# Patient Record
Sex: Female | Born: 1980 | Race: Black or African American | Hispanic: No | Marital: Married | Smoking: Never smoker
Health system: Southern US, Community
[De-identification: ages and names within clinical notes are randomized; demographics above are authoritative.]

## PROBLEM LIST (undated history)

## (undated) ENCOUNTER — Emergency Department: Payer: Self-pay

## (undated) DIAGNOSIS — D508 Other iron deficiency anemias: Secondary | ICD-10-CM

## (undated) DIAGNOSIS — K589 Irritable bowel syndrome without diarrhea: Secondary | ICD-10-CM

## (undated) DIAGNOSIS — I10 Essential (primary) hypertension: Secondary | ICD-10-CM

## (undated) DIAGNOSIS — K5090 Crohn's disease, unspecified, without complications: Secondary | ICD-10-CM

## (undated) DIAGNOSIS — E7800 Pure hypercholesterolemia, unspecified: Secondary | ICD-10-CM

## (undated) DIAGNOSIS — E119 Type 2 diabetes mellitus without complications: Secondary | ICD-10-CM

## (undated) HISTORY — DX: Crohn's disease, unspecified, without complications: K50.90

## (undated) HISTORY — DX: Other iron deficiency anemias: D50.8

## (undated) HISTORY — DX: Type 2 diabetes mellitus without complications: E11.9

---

## 2005-06-16 ENCOUNTER — Ambulatory Visit: Payer: Self-pay | Admitting: Family Medicine

## 2005-06-16 ENCOUNTER — Other Ambulatory Visit: Admission: RE | Admit: 2005-06-16 | Discharge: 2005-06-16 | Payer: Self-pay | Admitting: Family Medicine

## 2005-06-16 ENCOUNTER — Encounter: Payer: Self-pay | Admitting: Family Medicine

## 2005-11-14 ENCOUNTER — Ambulatory Visit: Payer: Self-pay | Admitting: Family Medicine

## 2005-12-20 ENCOUNTER — Ambulatory Visit: Payer: Self-pay | Admitting: Family Medicine

## 2006-01-25 ENCOUNTER — Ambulatory Visit: Payer: Self-pay | Admitting: Family Medicine

## 2006-04-26 ENCOUNTER — Ambulatory Visit: Payer: Self-pay | Admitting: Family Medicine

## 2006-04-26 DIAGNOSIS — L0292 Furuncle, unspecified: Secondary | ICD-10-CM | POA: Insufficient documentation

## 2006-04-26 DIAGNOSIS — L0293 Carbuncle, unspecified: Secondary | ICD-10-CM

## 2006-04-26 DIAGNOSIS — R350 Frequency of micturition: Secondary | ICD-10-CM

## 2006-04-26 DIAGNOSIS — I10 Essential (primary) hypertension: Secondary | ICD-10-CM

## 2006-04-26 LAB — CONVERTED CEMR LAB
Beta hcg, urine, semiquantitative: NEGATIVE
Ketones, urine, test strip: NEGATIVE
Protein, U semiquant: NEGATIVE
Specific Gravity, Urine: 1.02
Urobilinogen, UA: 0.2
pH: 8

## 2006-05-15 ENCOUNTER — Ambulatory Visit: Payer: Self-pay | Admitting: Family Medicine

## 2006-05-16 ENCOUNTER — Encounter: Payer: Self-pay | Admitting: Family Medicine

## 2006-05-17 ENCOUNTER — Telehealth: Payer: Self-pay | Admitting: Family Medicine

## 2006-05-18 ENCOUNTER — Encounter: Payer: Self-pay | Admitting: Family Medicine

## 2008-11-13 ENCOUNTER — Ambulatory Visit: Payer: Self-pay | Admitting: Family Medicine

## 2008-11-13 DIAGNOSIS — R21 Rash and other nonspecific skin eruption: Secondary | ICD-10-CM

## 2008-11-13 DIAGNOSIS — R635 Abnormal weight gain: Secondary | ICD-10-CM | POA: Insufficient documentation

## 2009-02-19 ENCOUNTER — Ambulatory Visit: Payer: Self-pay | Admitting: Family Medicine

## 2009-02-19 ENCOUNTER — Inpatient Hospital Stay (HOSPITAL_COMMUNITY): Admission: AD | Admit: 2009-02-19 | Discharge: 2009-02-19 | Payer: Self-pay | Admitting: Family Medicine

## 2009-02-19 DIAGNOSIS — N751 Abscess of Bartholin's gland: Secondary | ICD-10-CM

## 2009-02-19 LAB — CONVERTED CEMR LAB
Bilirubin Urine: NEGATIVE
Blood in Urine, dipstick: NEGATIVE
Glucose, Urine, Semiquant: NEGATIVE
pH: 8.5

## 2009-05-12 ENCOUNTER — Encounter: Payer: Self-pay | Admitting: Family Medicine

## 2009-05-12 ENCOUNTER — Telehealth: Payer: Self-pay | Admitting: Family Medicine

## 2009-08-23 ENCOUNTER — Inpatient Hospital Stay (HOSPITAL_COMMUNITY): Admission: AD | Admit: 2009-08-23 | Discharge: 2009-08-23 | Payer: Self-pay | Admitting: Family Medicine

## 2009-08-24 ENCOUNTER — Telehealth: Payer: Self-pay | Admitting: Family Medicine

## 2009-10-05 ENCOUNTER — Telehealth (INDEPENDENT_AMBULATORY_CARE_PROVIDER_SITE_OTHER): Payer: Self-pay | Admitting: *Deleted

## 2010-01-04 ENCOUNTER — Encounter: Payer: Self-pay | Admitting: Family Medicine

## 2010-01-12 ENCOUNTER — Telehealth (INDEPENDENT_AMBULATORY_CARE_PROVIDER_SITE_OTHER): Payer: Self-pay | Admitting: *Deleted

## 2010-01-17 ENCOUNTER — Telehealth: Payer: Self-pay | Admitting: Family Medicine

## 2010-01-21 ENCOUNTER — Ambulatory Visit: Payer: Self-pay | Admitting: Family Medicine

## 2010-01-21 DIAGNOSIS — D509 Iron deficiency anemia, unspecified: Secondary | ICD-10-CM

## 2010-01-25 ENCOUNTER — Encounter: Payer: Self-pay | Admitting: Family Medicine

## 2010-02-07 ENCOUNTER — Encounter: Payer: Self-pay | Admitting: Family Medicine

## 2010-02-08 ENCOUNTER — Encounter: Payer: Self-pay | Admitting: Family Medicine

## 2010-02-09 LAB — CONVERTED CEMR LAB
ALT: 21 units/L (ref 0–35)
BUN: 10 mg/dL (ref 6–23)
CO2: 28 meq/L (ref 19–32)
Calcium: 9.4 mg/dL (ref 8.4–10.5)
Chloride: 101 meq/L (ref 96–112)
Cholesterol: 248 mg/dL — ABNORMAL HIGH (ref 0–200)
Creatinine, Ser: 0.75 mg/dL (ref 0.40–1.20)
Glucose, Bld: 105 mg/dL — ABNORMAL HIGH (ref 70–99)
HCT: 39.7 % (ref 36.0–46.0)
HDL: 49 mg/dL (ref >39)
Hemoglobin: 11.8 g/dL — ABNORMAL LOW (ref 12.0–15.0)
Iron: 23 ug/dL — ABNORMAL LOW (ref 42–145)
RBC: 5.29 M/uL — ABNORMAL HIGH (ref 3.87–5.11)
Total CHOL/HDL Ratio: 5.1
WBC: 6.1 10*3/uL (ref 4.0–10.5)

## 2010-02-21 ENCOUNTER — Encounter: Payer: Self-pay | Admitting: Family Medicine

## 2010-04-27 ENCOUNTER — Ambulatory Visit: Payer: Self-pay | Admitting: Family Medicine

## 2010-04-27 DIAGNOSIS — R1013 Epigastric pain: Secondary | ICD-10-CM | POA: Insufficient documentation

## 2010-04-27 DIAGNOSIS — R7301 Impaired fasting glucose: Secondary | ICD-10-CM | POA: Insufficient documentation

## 2010-04-27 DIAGNOSIS — E785 Hyperlipidemia, unspecified: Secondary | ICD-10-CM | POA: Insufficient documentation

## 2010-04-27 DIAGNOSIS — E1169 Type 2 diabetes mellitus with other specified complication: Secondary | ICD-10-CM | POA: Insufficient documentation

## 2010-04-28 LAB — CONVERTED CEMR LAB
ALT: 24 units/L (ref 0–35)
CO2: 27 meq/L (ref 19–32)
Calcium: 9.2 mg/dL (ref 8.4–10.5)
Chloride: 103 meq/L (ref 96–112)
Creatinine, Ser: 0.78 mg/dL (ref 0.40–1.20)
Lipase: 22 units/L (ref 0–75)
Total Protein: 7.2 g/dL (ref 6.0–8.3)

## 2010-05-09 ENCOUNTER — Ambulatory Visit: Payer: Self-pay | Admitting: Family Medicine

## 2010-05-09 ENCOUNTER — Other Ambulatory Visit: Admission: RE | Admit: 2010-05-09 | Discharge: 2010-05-09 | Payer: Self-pay | Admitting: Family Medicine

## 2010-05-10 LAB — CONVERTED CEMR LAB

## 2010-06-28 ENCOUNTER — Ambulatory Visit
Admission: RE | Admit: 2010-06-28 | Discharge: 2010-06-28 | Payer: Self-pay | Source: Home / Self Care | Attending: Family Medicine | Admitting: Family Medicine

## 2010-06-28 DIAGNOSIS — G43909 Migraine, unspecified, not intractable, without status migrainosus: Secondary | ICD-10-CM | POA: Insufficient documentation

## 2010-07-12 NOTE — Progress Notes (Signed)
Summary: questios about FMLA Paper work  Phone Note Call from Patient   Caller: Patient Summary of Call: Dr.Bowen    Call Back  [phone]  Patient want to know what she need to do so she can get her FMLA Paper work out, she said that Dr.Bowen is the one that gave her the meds and she want to know why she cant fill out the paper.Marland KitchenMarland KitchenPatient only want to hear from [place] Initial call taken by: Vanessa Martinique,  January 17, 2010 12:14 PM  Follow-up for Phone Call        I called and [facility] explaining that for well controlled HTN, I only need to see her q 6-12 mos and this does not require FMLA papers to be filled out. Follow-up by: Loyal Gambler DO,  January 17, 2010 1:07 PM

## 2010-07-12 NOTE — Assessment & Plan Note (Signed)
Summary: CPE with pap   Vital Signs:  Patient profile:   30 year old female Menstrual status:  regular LMP:     04/27/2010 Height:      65 inches Weight:      222 pounds BMI:     37.08 O2 Sat:      99 % on Room air Pulse rate:   72 / minute BP sitting:   135 / 88  (left arm) Cuff size:   large  Vitals Entered By: Sherlean Foot CMA (May 09, 2010 8:25 AM)  O2 Flow:  Room air CC: CPE w/ pap LMP (date): 04/27/2010     Menstrual Status regular Enter LMP: 04/27/2010   Primary Care Provider:  Loyal Gambler DO  CC:  CPE w/ pap.  History of Present Illness: 30 yo AAF presents for CPE with pap smear.  Had abnormal pap years ago with a f/u colposcopy and cryo but did not require any further procedures.  She is a [id number], married s/p BTL for contraception.  REgular periods each month lasting 5 days, no heavy.  On iron two times a day for iron def anemia and recently started cholesterol pills in additon to her BP meds.  She is not yet due for repeat labs.  Denies a fam hx of colon cancer or breast cancer or of premature heart dz.  She would like STD testing done.  Current Medications (verified): 1)  Micardis Hct 80-12.5 Mg Tabs (Telmisartan-Hctz) .Marland Kitchen.. 1 Tab By Mouth Daily 2)  Minocycline Hcl 50 Mg Caps (Minocycline Hcl) .... Take 1 Tab By Mouth Once Daily 3)  Ferrous Sulfate 325 (65 Fe) Mg Tabs (Ferrous Sulfate) .Marland Kitchen.. 1 Tab By Mouth Three Times A Day 4)  Bentyl 10 Mg Caps (Dicyclomine Hcl) .Marland Kitchen.. 1 Capsule By Mouth Two Times A Day As Needed Abdominal Cramps 5)  Lipitor 20 Mg Tabs (Atorvastatin Calcium) .Marland Kitchen.. 1 Tab By Mouth Qhs  Allergies (verified): No Known Drug Allergies  Past History:  Past Medical History: cervical dysplasia  G3P3003- term deliveries HTN 3 LTCs, BTL obesity  Past Surgical History: cryo of cervix  LTCS x2- failure to dilate tummy tuck BTL  Family History: Reviewed history from 11/13/2008 and no changes required. 1 brother died from accident  5  sisters and 1 brother alive and healthy  mother and father alive and healthy MGM diabetes, Maunt diabetic  Social History: Reviewed history from 01/21/2010 and no changes required. Working and going to school.  Has boyfriend, Drenda Freeze.  Quit smoking.   Does not exercise.  Wants to lose wt.   Jalin  11 Deshante 7 Jamiya 1 works at [employer].  Review of Systems       The patient complains of weight gain.  The patient denies anorexia, fever, weight loss, vision loss, decreased hearing, hoarseness, chest pain, syncope, dyspnea on exertion, peripheral edema, prolonged cough, headaches, hemoptysis, abdominal pain, melena, hematochezia, severe indigestion/heartburn, hematuria, incontinence, genital sores, muscle weakness, suspicious skin lesions, transient blindness, difficulty walking, depression, unusual weight change, abnormal bleeding, enlarged lymph nodes, angioedema, breast masses, and testicular masses.    Physical Exam  General:  alert, well-developed, well-nourished, and well-hydrated.  obese Head:  normocephalic and atraumatic.   Eyes:  PERRLA Ears:  no external deformities.   Nose:  no nasal discharge.   Mouth:  good dentition and pharynx pink and moist.   Neck:  no masses.   Breasts:  No mass, nodules, thickening, tenderness, bulging, retraction, inflamation, nipple discharge or skin changes noted.  Lungs:  Normal respiratory effort, chest expands symmetrically. Lungs are clear to auscultation, no crackles or wheezes. Heart:  Normal rate and regular rhythm. S1 and S2 normal without gallop, murmur, click, rub or other extra sounds. Abdomen:  Bowel sounds positive,abdomen soft and non-tender without masses, organomegaly Genitalia:  Pelvic Exam:        External: normal female genitalia without lesions or masses        Vagina: normal without lesions or masses        Cervix: normal without lesions or masses, stenotic os        Adnexa: normal bimanual exam without masses or fullness         Uterus: normal by palpation        Pap smear: performed Pulses:  2+ radial and pedal pulses Extremities:  no UE or LE edema Skin:  color normal and no rashes.   Cervical Nodes:  No lymphadenopathy noted Psych:  good eye contact, not anxious appearing, and not depressed appearing.     Impression & Recommendations:  Problem # 1:  ROUTINE GYNECOLOGICAL EXAMINATION (ICD-V72.31) Keeping healthy checklist for women reviewed. BP ok on Micardis -HCTZ, will continue. BMI 37 c/w class II obesity. Thin prep pap smear done.   Declined flu shot.  Tetanus UTD. FAsting labs are UTD. Will work on [employer], regular exercise and wt loss. MVI and calcium with D daily encouraged. BTL for contraception. RTC in 4 mos for f/u.  Complete Medication List: 1)  Micardis Hct 80-12.5 Mg Tabs (Telmisartan-hctz) .Marland Kitchen.. 1 tab by mouth daily 2)  Minocycline Hcl 50 Mg Caps (Minocycline hcl) .... Take 1 tab by mouth once daily 3)  Ferrous Sulfate 325 (65 Fe) Mg Tabs (Ferrous sulfate) .Marland Kitchen.. 1 tab by mouth three times a day 4)  Bentyl 10 Mg Caps (Dicyclomine hcl) .Marland Kitchen.. 1 capsule by mouth two times a day as needed abdominal cramps 5)  Lipitor 20 Mg Tabs (Atorvastatin calcium) .Marland Kitchen.. 1 tab by mouth qhs  Other Orders: T-Lipoprotein (LDL cholesterol)  ([id number]) T-Liver Profile [phone]) T-Glucose, Blood [phone]) T-Iron [id number]) T-HIV Antibody  (Reflex) [id number]) T-Hepatitis C Antibody ([id number]) T-RPR (Syphilis) ([id number])  Patient Instructions: 1)  Labs today. 2)  Will call you with lab and pap results in the next 7 days. 3)  Let's work on [employer], regular exercise and wt loss. 4)  Repeat fasting labs in 3 mos-- go downstairs to have drawn. 5)  Return for follow up in 4 mos.   Orders Added: 1)  T-Lipoprotein (LDL cholesterol)  [[id number]] 2)  T-Liver Profile [[id number]] 3)  T-Glucose, Blood [33832-91916] 4)  T-Iron [60600-45997] 5)  T-HIV Antibody  (Reflex)  [[id number]] 6)  T-Hepatitis C Antibody [[id number]] 7)  T-RPR (Syphilis) [[id number]] 8)  Est. Patient age 30-39 [phone]

## 2010-07-12 NOTE — Letter (Signed)
Summary: [facility] By: Edmonia James 02/22/2010 11:57:28  _____________________________________________________________________  External Attachment:    Type:   Image     Comment:   External Document

## 2010-07-12 NOTE — Progress Notes (Signed)
Summary: Call A Nurse  Phone Note Call from Patient   Caller: Patient Summary of Call: [facility] Triage Call Report Triage Record Num: [id number] Operator: Leota Sauers Patient Name: Stacie Cardenas Call Date & Time: 08/23/2009 5:34:53PM Patient Phone: [phone] PCP: Loyal Gambler, DO Patient Gender: Female PCP Fax : [phone] Patient DOB: Nov 21, 1980 Practice Name: [facility] Reason for Call: Pt calling about left abdominal pain above the waist and "stomach" pain. LMP 3/11. Afebrile/subjective. Pain rated at 8 of 10, but can stand straight. Protocol(s) Used: Abdominal Pain / Discomfort Recommended Outcome per Protocol: See ED Immediately Reason for Outcome: Sudden onset or recurring abdominal pain that radiates to upper back or shoulder AND any of the following: loss of appetite, vomiting starting after pain, any fever, OR unable to carry out normal activities Care Advice: Initial call taken by: Sherlean Foot CMA,  August 24, 2009 8:15 AM

## 2010-07-12 NOTE — Letter (Signed)
Summary: [facility]  [facility]   Imported By: Edmonia James 02/17/2010 09:33:29  _____________________________________________________________________  External Attachment:    Type:   Image     Comment:   External Document

## 2010-07-12 NOTE — Assessment & Plan Note (Signed)
Summary: abd pain/ BP/ chol   Vital Signs:  Patient profile:   30 year old female Height:      65 inches Weight:      225 pounds BMI:     37.58 O2 Sat:      99 % on Room air Temp:     98.6 degrees F oral Pulse rate:   86 / minute BP sitting:   149 / 90  (left arm) Cuff size:   large  Vitals Entered By: Sherlean Foot CMA (April 27, 2010 2:38 PM)  O2 Flow:  Room air CC: LUQ pain after eating x 2 weeks. Denies fever, nausea and vomiting.  Also, has not started Micardis bc she still has plain HCTZ that she didn't want to waste.   Primary Care Provider:  Loyal Gambler DO  CC:  LUQ pain after eating x 2 weeks. Denies fever, nausea and vomiting.  Also, and has not started Micardis bc she still has plain HCTZ that she didn't want to waste.Marland Kitchen  History of Present Illness: 30 yo AAF presents for LUQ pain x 2 wks.  She has worsening pain after she eats.  No N/V.  She is not having diarrhea but is having more frequent stools after she eats.  She has been under more stress.  Denies any blood in her stool.  Not on any NSAIDs.  Denies radiation of pain or sharp pains.  Admits to a poor diet.  Has had a tummy tuck and c sections in the past.   Allergies (verified): No Known Drug Allergies  Past History:  Past Medical History: Reviewed history from 01/21/2010 and no changes required. cervical dysplasia  [id number]- term deliveries HTN 3 LTCs, BTL obesity   pap due  Past Surgical History: cryo of cervix  LTCS x2- failure to dilate tummy tuck  Social History: Reviewed history from 01/21/2010 and no changes required. Working and going to school.  Has boyfriend, Drenda Freeze.  Quit smoking.   Does not exercise.  Wants to lose wt.   Jalin  11 Deshante 7 Jamiya 1 works at [employer].  Review of Systems      See HPI  Physical Exam  General:  alert, well-developed, well-nourished, and well-hydrated.  truncal obesity Head:  normocephalic and atraumatic.   Eyes:  sclera non icteric Nose:  no  nasal discharge.   Mouth:  pharynx pink and moist.   Neck:  no masses.   Lungs:  Normal respiratory effort, chest expands symmetrically. Lungs are clear to auscultation, no crackles or wheezes. Heart:  Normal rate and regular rhythm. S1 and S2 normal without gallop, murmur, click, rub or other extra sounds. Abdomen:  soft, non-tender, normal bowel sounds, no distention, no masses, no guarding, no rigidity, no hepatomegaly, and no splenomegaly.  neg Murphys sign Extremities:  no LE edema Skin:  color normal.     Impression & Recommendations:  Problem # 1:  ABDOMINAL PAIN, EPIGASTRIC (ICD-789.06) Epigastric/ LUQ pain with more frequent stools, occuring postprandially.  Likely to be IBS but will get labs to r/o cholecystitis and pancreatitis today.  If normal, proceed with trial of bentyl along with dietary changes for IBS.  Call if not improving.   Orders: T-Comprehensive Metabolic Panel ([id number]) T-Lipase [id number])  Problem # 2:  HYPERTENSION, BENIGN ESSENTIAL (ICD-401.1) BP is HIGH.  Reminded pt to take her regular medications and to work on low sodium diet, exercise, wt loss.  Add additonal agent if >140/90 next visit. Her updated medication list for this  problem includes:    Micardis Hct 80-12.5 Mg Tabs (Telmisartan-hctz) .Marland Kitchen... 1 tab by mouth daily  BP today: 149/90 Prior BP: 139/85 (01/21/2010)  Labs Reviewed: K+: 4.3 (02/08/2010) Creat: : 0.75 (02/08/2010)   Chol: 248 (02/08/2010)   HDL: 49 (02/08/2010)   LDL: 175 (02/08/2010)   TG: 120 (02/08/2010)  Problem # 3:  HYPERLIPIDEMIA (ICD-272.4) Reviewed her labs from Aug showing a high LDL of 175.  Added Lipitor at bedtime each day and encouraged appropriate lifestyle changes. Plan to repeat LDL with LFTs in 8-12 wks. Her updated medication list for this problem includes:    Lipitor 20 Mg Tabs (Atorvastatin calcium) .Marland Kitchen... 1 tab by mouth qhs  Labs Reviewed: SGOT: 22 (02/08/2010)   SGPT: 21 (02/08/2010)   HDL:49  (02/08/2010)  LDL:175 (02/08/2010)  Chol:248 (02/08/2010)  Trig:120 (02/08/2010)  Problem # 4:  IMPAIRED FASTING GLUCOSE (ICD-790.21) Assessment: New Reviewed labs from Aug showing a fasting glucose of 105 = IFG. Her BMI is 37.5 c/w class II obesity. We discussed low sugar, low carb diet, exercise and wt loss.  Complete Medication List: 1)  Micardis Hct 80-12.5 Mg Tabs (Telmisartan-hctz) .Marland Kitchen.. 1 tab by mouth daily 2)  Minocycline Hcl 50 Mg Caps (Minocycline hcl) .... Take 1 tab by mouth once daily 3)  Ferrous Sulfate 325 (65 Fe) Mg Tabs (Ferrous sulfate) .Marland Kitchen.. 1 tab by mouth three times a day 4)  Bentyl 10 Mg Caps (Dicyclomine hcl) .Marland Kitchen.. 1 capsule by mouth two times a day as needed abdominal cramps 5)  Lipitor 20 Mg Tabs (Atorvastatin calcium) .Marland Kitchen.. 1 tab by mouth qhs  Patient Instructions: 1)  Start Lipitor at bedtime each day for high cholesterol 2)  Use Bentyl for cramping -- probable IBS. 3)  Check labs today. 4)  Will call you w/ results tomorrow. 5)  REturn for f/u in 4 wks. Prescriptions: LIPITOR 20 MG TABS (ATORVASTATIN CALCIUM) 1 tab by mouth qhs  #30 x 2   Entered and Authorized by:   Loyal Gambler DO   Signed by:   Loyal Gambler DO on 04/27/2010   Method used:   [facility]ron[facility] to        [facility]  [address] [phone]* (retail)       [place]. [place], [place]  [address]       Ph: [phone] or [phone]       Fax: [phone]   RxID:   [id number] BENTYL 10 MG CAPS (DICYCLOMINE HCL) 1 capsule by mouth two times a day as needed abdominal cramps  #40 x 0   Entered and Authorized by:   Loyal Gambler DO   Signed by:   Loyal Gambler DO on 04/27/2010   Method used:   [facility]        [facility]  [address] [phone]* (retail)       [place]. [place], [place]  [address]       Ph: [phone] or [phone]       Fax: [phone]   RxID:   [id number]    Orders Added: 1)  T-Comprehensive Metabolic Panel  [[id number]] 2)  T-Lipase [[id number]] 3)  Est. Patient Level IV [[address]270]

## 2010-07-12 NOTE — Progress Notes (Signed)
Summary: refill request  Phone Note Refill Request Message from:  Patient on October 05, 2009 2:29 PM  Refills Requested: Medication #1:  HYDROCHLOROTHIAZIDE 12.5 MG TABS 1 tab by mouth daily.   Dosage confirmed as above?Dosage Confirmed She is completely out of this med and needs it called in as soon as possible. Thank you  Next Appointment Scheduled: 10/08/09 Initial call taken by: Otho Ket,  October 05, 2009 2:30 PM

## 2010-07-12 NOTE — Consult Note (Signed)
Summary: [facility]  [facility]   Imported By: Edmonia James 02/01/2010 12:48:13  _____________________________________________________________________  External Attachment:    Type:   Image     Comment:   External Document

## 2010-07-12 NOTE — Assessment & Plan Note (Signed)
Summary: HTN/ FMLA   Vital Signs:  Patient profile:   30 year old female Height:      65 inches Weight:      222 pounds BMI:     37.08 O2 Sat:      100 % on Room air Pulse rate:   88 / minute BP sitting:   139 / 85  (left arm) Cuff size:   large  Vitals Entered By: Sherlean Foot CMA (January 21, 2010 3:10 PM)  O2 Flow:  Room air CC: F/U HTN   Primary Care Provider:  Loyal Gambler DO  CC:  F/U HTN.  History of Present Illness: 30 yo AAF presents for f/u HTN.  She missed work July 25th --28th for a HA that was felt to be secondary to High BP.  She had been taking HCTZ 12.5 mg and was compliant with it.  Denies any acute stressors other than work but has had some additional wt gain.  She was increased on her HCTZ to 25 mg / day but her HA took 3 days to resolve.  Her iron was tested and was low so she was also started on RX iron three times a day about 2 wks ago.  She does have heavy menses.    She denies recurring HAs.  Denies vision changes, CP, palpitaitons, SOB or leg swelling.    Current Medications (verified): 1)  Hydrochlorothiazide 25 Mg Tabs (Hydrochlorothiazide) .... Take 1 Tab By Mouth Once Daily 2)  Minocycline Hcl 50 Mg Caps (Minocycline Hcl) .... Take 1 Tab By Mouth Once Daily  Allergies (verified): No Known Drug Allergies  Past History:  Past Medical History: cervical dysplasia  [id number]- term deliveries HTN 3 LTCs, BTL obesity   pap due  Social History: Reviewed history from 11/13/2008 and no changes required. Working and going to school.  Has boyfriend, Drenda Freeze.  Quit smoking.   Does not exercise.  Wants to lose wt.   Jalin  11 Deshante 7 Jamiya 1 works at [employer].  Review of Systems      See HPI  Physical Exam  General:  alert, well-developed, well-nourished, and well-hydrated.  obese Head:  normocephalic and atraumatic.   Eyes:  pupils equal, pupils round, and pupils reactive to light.   Mouth:  pharynx pink and moist.   Neck:  no masses.     Lungs:  Normal respiratory effort, chest expands symmetrically. Lungs are clear to auscultation, no crackles or wheezes. Heart:  Normal rate and regular rhythm. S1 and S2 normal without gallop, murmur, click, rub or other extra sounds. Pulses:  2+ radial pulses Extremities:  no LE edema Skin:  acne Psych:  good eye contact, not anxious appearing, and not depressed appearing.     Impression & Recommendations:  Problem # 1:  HYPERTENSION, BENIGN ESSENTIAL (ICD-401.1) BP at the ULN.  Will complete her FMLAs for the 3 days of work that she missed.  Change HCTZ 25 mg to Micardis/ HCTZ.  Update fasting labs.  RTC in 2 mos for f/u with repeat BMP.  Call if anyproblmes on the new medicine. Her updated medication list for this problem includes:    Micardis Hct 80-12.5 Mg Tabs (Telmisartan-hctz) .Marland Kitchen... 1 tab by mouth daily  Orders: T-Comprehensive Metabolic Panel ([id number])  Problem # 2:  IRON DEFICIENCY (ICD-280.9) Recheck iron levels to see if we can drop her to two times a day dosing.  She is due to schedule CPE with pap, may need eval for fibroids as heavy  menses is the cause for her iron def.   Her updated medication list for this problem includes:    Ferrous Sulfate 325 (65 Fe) Mg Tabs (Ferrous sulfate) .Marland Kitchen... 1 tab by mouth three times a day  Orders: T-CBC No Diff ([id number]) T-Iron ([id number])  Problem # 3:  WEIGHT GAIN (ICD-783.1) BMI 37 c/w class II obesity.  Wt increase may be why her BP has risen.  Discussed healthy low sodium diet, exercise, wt loss.  RTC for recheck in 2 mos.  Check TSH with labs.  Complete Medication List: 1)  Micardis Hct 80-12.5 Mg Tabs (Telmisartan-hctz) .Marland Kitchen.. 1 tab by mouth daily 2)  Minocycline Hcl 50 Mg Caps (Minocycline hcl) .... Take 1 tab by mouth once daily 3)  Ferrous Sulfate 325 (65 Fe) Mg Tabs (Ferrous sulfate) .Marland Kitchen.. 1 tab by mouth three times a day  Other Orders: T-Lipid Profile ([id number]) T-TSH [id number])  Patient  Instructions: 1)  FMLA papers will be sent in. 2)  Change HCTZ to Micardis/HCTZ once a day. 3)  Update fasting labs next wk.  4)  Will call you w/ results. 5)  REturn for f/u BP/WT in 2 mos. Prescriptions: MICARDIS HCT 80-12.5 MG TABS (TELMISARTAN-HCTZ) 1 tab by mouth daily  #30 x 2   Entered and Authorized by:   Loyal Gambler DO   Signed by:   Loyal Gambler DO on 01/21/2010   Method used:   [facility]        [facility]  [address] [phone][facility] (retail)       [place]. [place], [place]  [address]       Ph: [phone] or [phone]       Fax: [phone]   RxID:   [id number]

## 2010-07-12 NOTE — Progress Notes (Signed)
Summary: pt has a question  Phone Note Call from Patient   Summary of Call: Pt wants to know if you got her notes from [place] at [facility] on 7/26? Call pt back after 2:00 at [phone] Initial call taken by: Otho Ket,  January 12, 2010 1:01 PM  Follow-up for Phone Call        No notes received yet Follow-up by: Sherlean Foot CMA,  January 12, 2010 3:51 PM

## 2010-07-12 NOTE — Letter (Signed)
Summary: [facility]  [facility]   Imported By: Edmonia James 03/09/2010 12:56:37  _____________________________________________________________________  External Attachment:    Type:   Image     Comment:   External Document

## 2010-07-14 NOTE — Assessment & Plan Note (Signed)
Summary: HA/ BP   Vital Signs:  Patient profile:   30 year old female Menstrual status:  regular Height:      65 inches Weight:      223 pounds BMI:     37.24 O2 Sat:      99 % on Room air Pulse rate:   92 / minute BP sitting:   143 / 90  (left arm) Cuff size:   large  Vitals Entered By: Sherlean Foot CMA (June 28, 2010 3:20 PM)  O2 Flow:  Room air CC: HA x 1 week (on and off)   Primary Care Provider:  Loyal Gambler DO  CC:  HA x 1 week (on and off).  History of Present Illness: 30 YO AAF presents for a bad HA on and off x the past wk.  Her BP has been running high.  She has been sporadic with her use of her BP meds.  Advil is not helping relieve her HA.  She denies a hx of migraines.  She was getting mild HAs every other day prior to this.  She has R temporal throbbing that is incapacitating.  She is missing work today.  She denies any blurry or double vision.  Denies nausea or vomitting.  She has some photophobia.      Current Medications (verified): 1)  Micardis Hct 80-12.5 Mg Tabs (Telmisartan-Hctz) .Marland Kitchen.. 1 Tab By Mouth Daily 2)  Minocycline Hcl 50 Mg Caps (Minocycline Hcl) .... Take 1 Tab By Mouth Once Daily 3)  Ferrous Sulfate 325 (65 Fe) Mg Tabs (Ferrous Sulfate) .Marland Kitchen.. 1 Tab By Mouth Three Times A Day 4)  Lipitor 20 Mg Tabs (Atorvastatin Calcium) .Marland Kitchen.. 1 Tab By Mouth Qhs  Allergies (verified): No Known Drug Allergies  Past History:  Past Medical History: Reviewed history from 05/09/2010 and no changes required. cervical dysplasia  [id number]- term deliveries HTN 3 LTCs, BTL obesity  Social History: Reviewed history from 01/21/2010 and no changes required. Working and going to school.  Has boyfriend, Drenda Freeze.  Quit smoking.   Does not exercise.  Wants to lose wt.   Jalin  11 [address] 7 Jamiya 1 works at [employer].  Review of Systems      See HPI  Physical Exam  General:  alert, well-developed, well-nourished, and well-hydrated.  obese Head:  normocephalic  and atraumatic.  R temporal tenderness Eyes:  slight photophobia, PERRLA Mouth:  pharynx pink and moist.   Neck:  no masses.  no nuchal rigidity Lungs:  Normal respiratory effort, chest expands symmetrically. Lungs are clear to auscultation, no crackles or wheezes. Heart:  Normal rate and regular rhythm. S1 and S2 normal without gallop, murmur, click, rub or other extra sounds. Extremities:  no LE edema Skin:  color normal.   Psych:  good eye contact, not anxious appearing, and not depressed appearing.     Impression & Recommendations:  Problem # 1:  MIGRAINE HEADACHE (ICD-346.90) Assessment Deteriorated She meets IHS criteria for migraine and it sounds like she was having  ~15 mild HAs/ month with frequent use of Advil and over the past wk has had moderate to severe migraine, intractable to Advil.  I do think that her elevated BP (mild) is seconday to HA pain.  H/O given from [employer] on migraine prevention.  Will treat current HA with Vicodin (unable to use triptans with HTN), rest and start Topamax at night for migraine prevention, increasing up to 50 mg two times a day by f/u visit.  Call if any  problems. Her updated medication list for this problem includes:    Vicodin 5-500 Mg Tabs (Hydrocodone-acetaminophen) .Marland Kitchen... 1-2 tabs by mouth three times a day as needed severe headache pain; take with food  Problem # 2:  HYPERTENSION, BENIGN ESSENTIAL (ICD-401.1) BP elevated today but she has been inconsistent with use.  Advised compliance with her BP meds and will recheck at 2 wks f/u visit. Her updated medication list for this problem includes:    Micardis Hct 80-12.5 Mg Tabs (Telmisartan-hctz) .Marland Kitchen... 1 tab by mouth daily  BP today: 143/90 Prior BP: 135/88 (05/09/2010)  Labs Reviewed: K+: 4.1 (04/27/2010) Creat: : 0.78 (04/27/2010)   Chol: 248 (02/08/2010)   HDL: 49 (02/08/2010)   LDL: 175 (02/08/2010)   TG: 120 (02/08/2010)  Problem # 3:  IRON DEFICIENCY (ICD-280.9) Low iron may  be contributing to symptoms.  She is due to recheck levels at 2 mos f/u visit. Her updated medication list for this problem includes:    Ferrous Sulfate 325 (65 Fe) Mg Tabs (Ferrous sulfate) .Marland Kitchen... 1 tab by mouth three times a day  Complete Medication List: 1)  Micardis Hct 80-12.5 Mg Tabs (Telmisartan-hctz) .Marland Kitchen.. 1 tab by mouth daily 2)  Minocycline Hcl 50 Mg Caps (Minocycline hcl) .... Take 1 tab by mouth once daily 3)  Ferrous Sulfate 325 (65 Fe) Mg Tabs (Ferrous sulfate) .Marland Kitchen.. 1 tab by mouth three times a day 4)  Lipitor 20 Mg Tabs (Atorvastatin calcium) .Marland Kitchen.. 1 tab by mouth qhs 5)  Topiramate 50 Mg Tabs (Topiramate) .... 1/2 tab by mouth at bedtime x 1 wk then increase to 1 tab by mouth at bedtime x 1 wk then increase to 1 tab by mouth bid 6)  Vicodin 5-500 Mg Tabs (Hydrocodone-acetaminophen) .Marland Kitchen.. 1-2 tabs by mouth three times a day as needed severe headache pain; take with food  Patient Instructions: 1)  Start Topiramate - 25 mg at night x 1 wk then increase to 66m at night x 1 wk then increase to 50 mg twice a day. 2)  This is for migraine prevention. 3)  Take your Micardis HCTZ EVERYDAY for high BP. 4)  Use Vicodin 1-2 tabs today; repeat tomorrow AM if needed for severe pain.  Use sparingly. 5)  REturn in 2 wks for f/u BP / HAs/ iron recheck Prescriptions: VICODIN 5-500 MG TABS (HYDROCODONE-ACETAMINOPHEN) 1-2 tabs by mouth three times a day as needed severe headache pain; take with food  #24 x 0   Entered and Authorized by:   KLoyal GamblerDO   Signed by:   KLoyal GamblerDO on 06/28/2010   Method used:   Printed then faxed to ...       [id number]       [address]  [id number]      Ph: [phone]      Fax: [phone]  RxID:   [id number] 50 MG TABS (TOPIRAMATE) 1/2 tab by mouth at bedtime x 1 wk then increase to 1 tab by mouth at bedtime x 1 wk then increase to 1 tab by mouth bid   #60 x 1   Entered and Authorized by:   KLoyal GamblerDO   Signed by:   KLoyal GamblerDO on 06/28/2010   Method used:   [facility]ically [facility]        [id number]       [address] [place] [place]  [address]       Ph: [phone] or [phone]       Fax: [phone]   RxID:   [id number]    Orders Added: 1)  Est. Patient Level IV [95974]

## 2010-07-18 ENCOUNTER — Telehealth (INDEPENDENT_AMBULATORY_CARE_PROVIDER_SITE_OTHER): Payer: Self-pay | Admitting: *Deleted

## 2010-07-28 NOTE — Progress Notes (Signed)
Summary: needs OV/ vicodin denied.  Phone Note Outgoing Call   Summary of Call: Pls let pt know that I am denying RF of Vicodin because I don't want to keep her on these for migraines.  She is due to f/u with me for her migraines and iron def. Initial call taken by: Loyal Gambler DO,  July 18, 2010 10:22 AM  Follow-up for Phone Call        Pt aware of the above Follow-up by: Sherlean Foot CMA,  July 18, 2010 10:51 AM

## 2010-08-10 ENCOUNTER — Ambulatory Visit (INDEPENDENT_AMBULATORY_CARE_PROVIDER_SITE_OTHER): Payer: Commercial Indemnity | Admitting: Family Medicine

## 2010-08-10 ENCOUNTER — Encounter: Payer: Self-pay | Admitting: Family Medicine

## 2010-08-10 DIAGNOSIS — L738 Other specified follicular disorders: Secondary | ICD-10-CM | POA: Insufficient documentation

## 2010-08-10 DIAGNOSIS — N898 Other specified noninflammatory disorders of vagina: Secondary | ICD-10-CM | POA: Insufficient documentation

## 2010-08-10 DIAGNOSIS — D4959 Neoplasm of unspecified behavior of other genitourinary organ: Secondary | ICD-10-CM | POA: Insufficient documentation

## 2010-08-11 ENCOUNTER — Encounter: Payer: Self-pay | Admitting: Family Medicine

## 2010-08-11 LAB — CONVERTED CEMR LAB
Clue Cells Wet Prep HPF POC: NONE SEEN
Trich, Wet Prep: NONE SEEN
WBC, Wet Prep HPF POC: NONE SEEN

## 2010-08-12 ENCOUNTER — Telehealth (INDEPENDENT_AMBULATORY_CARE_PROVIDER_SITE_OTHER): Payer: Self-pay | Admitting: *Deleted

## 2010-08-18 NOTE — Progress Notes (Signed)
Summary: Lab results  Phone Note Call from Patient Call back at Work Phone [phone] Call back at [phone]   Reason for Call: Talk to Nurse Summary of Call: Pt is calling for her lab results, she will be at [phone] til 4PM, after that she will be at [phone] Initial call taken by: Titus Dubin,  August 12, 2010 3:20 PM

## 2010-08-18 NOTE — Assessment & Plan Note (Signed)
Summary: genital folliculitis   Vital Signs:  Patient profile:   30 year old female Menstrual status:  regular Height:      65 inches Weight:      217 pounds BMI:     36.24 O2 Sat:      98 % on Room air Temp:     98.8 degrees F oral Pulse rate:   81 / minute BP sitting:   144 / 92  (left arm) Cuff size:   large  Vitals Entered By: Sherlean Foot CMA (August 10, 2010 1:16 PM)  O2 Flow:  Room air CC: Bump in vaginal area and ? yest infection.   Primary Care Provider:  Loyal Gambler DO  CC:  Bump in vaginal area and ? yest infection.Marland Kitchen  History of Present Illness: 30 yo AAF presents for a cystic mass on the L side of her vagina that is getting bigger.  Noticed it monday.  Using warm compresses.  It hurts a lot.  Denies drainage.  No dysuria.  She has had a vaginal discharge with itching but no burning.    No new sexual partners.  Denies hx of STDs.  o/w feels fine.  Denies recently shaving or waxing.  Current Medications (verified): 1)  Micardis Hct 80-12.5 Mg Tabs (Telmisartan-Hctz) .Marland Kitchen.. 1 Tab By Mouth Daily 2)  Minocycline Hcl 50 Mg Caps (Minocycline Hcl) .... Take 1 Tab By Mouth Once Daily 3)  Ferrous Sulfate 325 (65 Fe) Mg Tabs (Ferrous Sulfate) .Marland Kitchen.. 1 Tab By Mouth Three Times A Day 4)  Lipitor 20 Mg Tabs (Atorvastatin Calcium) .Marland Kitchen.. 1 Tab By Mouth Qhs 5)  Topiramate 50 Mg Tabs (Topiramate) .... 1/2 Tab By Mouth At Bedtime X 1 Wk Then Increase To 1 Tab By Mouth At Bedtime X 1 Wk Then Increase To 1 Tab By Mouth Bid 6)  Vicodin 5-500 Mg Tabs (Hydrocodone-Acetaminophen) .Marland Kitchen.. 1-2 Tabs By Mouth Three Times A Day As Needed Severe Headache Pain; Take With Food  Allergies (verified): No Known Drug Allergies  Past History:  Past Medical History: Reviewed history from 05/09/2010 and no changes required. cervical dysplasia  [id number]- term deliveries HTN 3 LTCs, BTL obesity  Past Surgical History: Reviewed history from 05/09/2010 and no changes required. cryo of cervix  LTCS  x2- failure to dilate tummy tuck BTL  Social History: Reviewed history from 01/21/2010 and no changes required. Working and going to school.  Has boyfriend, Drenda Freeze.  Quit smoking.   Does not exercise.  Wants to lose wt.   Jalin  11 [address] 7 Jamiya 1 works at [employer].  Review of Systems      See HPI  Physical Exam  General:  alert, well-developed, well-nourished, and well-hydrated.  obese Genitalia:  normal introitus and mucosa pink and moist.  1 R and 2 L labia minora vesicular, hard and tender lesions, < 1 cm and w/o erythema or drainage Inguinal Nodes:  No significant adenopathy   Impression & Recommendations:  Problem # 1:  FOLLICULITIS (HTD-428.7) Genital lesions most c/w folliculitis.  Bacterial and herpes cx obtained after cleaning the L posterior lesion with alcohol swab and poking with TB syringe to obtain fluid cx.  Will f/u results and empirically treat for staph folliculitis with clindamycin orally and warm soapy soaks.  Call if any fever, growth or worsening pain.   Orders: T-Culture,Abcess ([id number])  Problem # 2:  VAGINAL DISCHARGE (ICD-623.5) None visible on exam today.  No new sexual partners.  WP obtained.  f/u results.  Orders: T-Wet Prep ([id number])  Complete Medication List: 1)  Micardis Hct 80-12.5 Mg Tabs (Telmisartan-hctz) .Marland Kitchen.. 1 tab by mouth daily 2)  Minocycline Hcl 50 Mg Caps (Minocycline hcl) .... Take 1 tab by mouth once daily 3)  Ferrous Sulfate 325 (65 Fe) Mg Tabs (Ferrous sulfate) .Marland Kitchen.. 1 tab by mouth three times a day 4)  Lipitor 20 Mg Tabs (Atorvastatin calcium) .Marland Kitchen.. 1 tab by mouth qhs 5)  Topiramate 50 Mg Tabs (Topiramate) .Marland Kitchen.. 1 tab by mouth two times a day 6)  Vicodin 5-500 Mg Tabs (Hydrocodone-acetaminophen) .Marland Kitchen.. 1-2 tabs by mouth three times a day as needed severe headache pain; take with food 7)  Clindamycin Hcl 300 Mg Caps (Clindamycin hcl) .Marland Kitchen.. 1 capsule by mouth two times a day x 7 days  Other Orders: T-Culture, Herpes  (Routine) ([id number])  Patient Instructions: 1)  Take Clindamycin x 7 days for probable folliculitis. 2)  Use warm soaks in the bath 10 min 2 x a day and clean with antibacterial soap. 3)  Call if not improved in 7-10 days. Prescriptions: CLINDAMYCIN HCL 300 MG CAPS (CLINDAMYCIN HCL) 1 capsule by mouth two times a day x 7 days  #14 x 0   Entered and Authorized by:   Loyal Gambler DO   Signed by:   Loyal Gambler DO on 08/10/2010   Method used:   [facility]        [facility]  [address] [address] [phone][facility] (retail)       [address] [place], [place]  [address]       Ph: [phone] or [phone]       Fax: [phone]   RxID:   [id number]    Orders Added: 1)  T-Culture, Herpes (Routine) [[id number]] 2)  T-Culture,Abcess [[id number]] 3)  T-Wet Prep [[id number]] 4)  Est. Patient Level III [12458]

## 2010-09-05 LAB — URINE MICROSCOPIC-ADD ON

## 2010-09-05 LAB — URINALYSIS, ROUTINE W REFLEX MICROSCOPIC
Bilirubin Urine: NEGATIVE
Glucose, UA: NEGATIVE mg/dL
Ketones, ur: NEGATIVE mg/dL
Protein, ur: 100 mg/dL — AB
pH: 7 (ref 5.0–8.0)

## 2010-09-05 LAB — WET PREP, GENITAL
Trich, Wet Prep: NONE SEEN
Yeast Wet Prep HPF POC: NONE SEEN

## 2010-09-05 LAB — GC/CHLAMYDIA PROBE AMP, GENITAL: Chlamydia, DNA Probe: NEGATIVE

## 2010-09-05 LAB — POCT PREGNANCY, URINE: Preg Test, Ur: NEGATIVE

## 2010-12-13 ENCOUNTER — Ambulatory Visit: Payer: Commercial Indemnity | Admitting: Family Medicine

## 2011-01-10 ENCOUNTER — Ambulatory Visit (HOSPITAL_COMMUNITY): Payer: Commercial Indemnity

## 2011-01-10 ENCOUNTER — Encounter: Payer: Self-pay | Admitting: Emergency Medicine

## 2011-01-10 ENCOUNTER — Other Ambulatory Visit: Payer: Self-pay | Admitting: Emergency Medicine

## 2011-01-10 ENCOUNTER — Inpatient Hospital Stay (INDEPENDENT_AMBULATORY_CARE_PROVIDER_SITE_OTHER)
Admission: RE | Admit: 2011-01-10 | Discharge: 2011-01-10 | Disposition: A | Discharge status: Home / Self Care | Payer: Commercial Indemnity | Source: Ambulatory Visit | Attending: Emergency Medicine | Admitting: Emergency Medicine

## 2011-01-10 ENCOUNTER — Ambulatory Visit
Admission: RE | Admit: 2011-01-10 | Discharge: 2011-01-10 | Disposition: A | Discharge status: Home / Self Care | Payer: Commercial Indemnity | Source: Ambulatory Visit | Attending: Emergency Medicine | Admitting: Emergency Medicine

## 2011-01-10 DIAGNOSIS — J029 Acute pharyngitis, unspecified: Secondary | ICD-10-CM | POA: Insufficient documentation

## 2011-01-10 DIAGNOSIS — R109 Unspecified abdominal pain: Secondary | ICD-10-CM

## 2011-01-12 ENCOUNTER — Telehealth (INDEPENDENT_AMBULATORY_CARE_PROVIDER_SITE_OTHER): Payer: Self-pay | Admitting: *Deleted

## 2011-05-15 NOTE — Telephone Encounter (Signed)
  Phone Note Outgoing Call Call back at Home Phone [phone] P [facility]     Call placed by: Betti Cruz RN,  January 12, 2011 11:38 AM Call placed to: Patient Summary of Call: Callback: No answer. Left message for patient to schedule an appointment with Dr. Valetta Close to discuss lab results.

## 2011-05-15 NOTE — Progress Notes (Signed)
Summary: ABDOMINAL PAIN,SORE THROAT/TJ   Vital Signs:  Patient Profile:   30 Years Old Female CC:      sore throat x this AM, abdominal pain x 2 weeks Height:     65 inches Weight:      219 pounds O2 Sat:      100 % O2 treatment:    Room Air Temp:     98.4 degrees F oral Pulse rate:   83 / minute Resp:     16 per minute BP sitting:   138 / 88  (left arm) Cuff size:   large  Pt. in pain?   yes    Location:   abdomen    Type:       cramping  Vitals Entered By: Betti Cruz RN (January 10, 2011 11:42 AM)                   Updated Prior Medication List: MICARDIS HCT 80-12.5 MG TABS (TELMISARTAN-HCTZ) 1 tab by mouth daily MINOCYCLINE HCL 50 MG CAPS (MINOCYCLINE HCL) Take 1 tab by mouth once daily FERROUS SULFATE 325 (65 FE) MG TABS (FERROUS SULFATE) 1 tab by mouth three times a day LIPITOR 20 MG TABS (ATORVASTATIN CALCIUM) 1 tab by mouth qhs  Current Allergies: No known allergies History of Present Illness History from: patient Chief Complaint: sore throat x this AM, abdominal pain x 2 weeks History of Present Illness: 1) ST since this morning.  No other symptoms such as F/C or other URI symptoms.  No known sick contacts.  2) LUQ pain x 2 wks.  She has worsening pain after she eats.  Pain is generally in the middle and feels crampy.  She has been Rx Bentyl previously for her IBS symptoms but can't find the pills.  Bloating, gassy.  Mild nausea.  No V.  She is not having diarrhea but is having more frequent stools.  Denies any blood in her stool.  Not on any NSAIDs.  Denies radiation of pain or sharp pains.  Has had a tummy tuck and C-sections in the past.  REVIEW OF SYSTEMS Constitutional Symptoms      Denies fever, chills, night sweats, weight loss, weight gain, and fatigue.  Eyes       Denies change in vision, eye pain, eye discharge, glasses, contact lenses, and eye surgery. Ear/Nose/Throat/Mouth       Complains of sore throat.      Denies hearing loss/aids, change in  hearing, ear pain, ear discharge, dizziness, frequent runny nose, frequent nose bleeds, sinus problems, hoarseness, and tooth pain or bleeding.  Respiratory       Denies dry cough, productive cough, wheezing, shortness of breath, asthma, bronchitis, and emphysema/COPD.  Cardiovascular       Denies murmurs, chest pain, and tires easily with exhertion.    Gastrointestinal       Complains of stomach pain and constipation.      Denies nausea/vomiting, diarrhea, blood in bowel movements, and indigestion. Genitourniary       Denies painful urination, blood or discharge from vagina, kidney stones, and loss of urinary control. Neurological       Denies paralysis, seizures, and fainting/blackouts. Musculoskeletal       Denies muscle pain, joint pain, joint stiffness, decreased range of motion, redness, swelling, muscle weakness, and gout.  Skin       Denies bruising, unusual mles/lumps or sores, and hair/skin or nail changes.  Psych       Denies mood changes,  temper/anger issues, anxiety/stress, speech problems, depression, and sleep problems. Other Comments: abdominal cramping intermittent x 2 weeks, constipation, hard stools, increasedf latus. last BM this AM. Sore throat started this AM   Past History:  Past Medical History: cervical dysplasia  [id number]- term deliveries HTN 3 LTCs, BTL obesity IBS  Past Surgical History: Reviewed history from 05/09/2010 and no changes required. cryo of cervix  LTCS x2- failure to dilate tummy tuck BTL  Family History: Reviewed history from 11/13/2008 and no changes required. 1 brother died from accident  5 sisters and 1 brother alive and healthy  mother and father alive and healthy MGM diabetes, Maunt diabetic  Social History: Reviewed history from 01/21/2010 and no changes required. Working and going to school.  Has boyfriend, Drenda Freeze.  Quit smoking.   Does not exercise.  Wants to lose wt.   Jalin  11 [address] 7 Jamiya 1 works at  [employer]. Physical Exam General appearance: well developed, well nourished, obese distress Ears: normal, no lesions or deformities Nasal: mucosa pink, nonedematous, no septal deviation, turbinates normal Oral/Pharynx: tongue normal, posterior pharynx without erythema or exudate Neck: no tenderness or LAD Chest/Lungs: no rales, wheezes, or rhonchi bilateral, breath sounds equal without effort Heart: regular rate and  rhythm, no murmur Abdomen: soft, non-tender without obvious organomegaly, no distention, +BS4Q, no guarding, no rebound, no RLQ pain or RUQ pain. MSE: oriented to time, place, and person Assessment New Problems: SORE THROAT (ICD-462) ABDOMINAL PAIN (ICD-789.00)   Plan New Medications/Changes: BENTYL 10 MG CAPS (DICYCLOMINE HCL) 1 by mouth two times a day as needed for abd cramps / pain  #30 x 0, 01/10/2011, Fidela Salisbury MD  New Orders: T-1 View Abdomen (KUB) [74000TC] Rapid Strep [id number] T-Comprehensive Metabolic Panel [[id number]] T-CBC w/Diff [[id number]] T-Lipase [[id number]] New Patient Level IV [51102] Planning Comments:   1) Sore throat likely from viral pharyngitis.  Rapid strep is negative.  Encourage hydration, cough drops, OTC meds as needed. 2) Abd pain - She was looking online and is very worries about SBO.  Ordered KUB and read by radiology as "There is an overall paucity of bowel gas but stool projects over the cecum and rectosigmoid region.".  Labs ordered: CMP, Lipase, CBC.  Likely her IBS symptoms.  Will Rx Bentyl and suggest she follow up with her PCP in the next few weeks, sooner if worsening.    The patient and/or caregiver has been counseled thoroughly with regard to medications prescribed including dosage, schedule, interactions, rationale for use, and possible side effects and they verbalize understanding.  Diagnoses and expected course of recovery discussed and will return if not improved as expected or if the condition worsens. Patient and/or  caregiver verbalized understanding.  Prescriptions: BENTYL 10 MG CAPS (DICYCLOMINE HCL) 1 by mouth two times a day as needed for abd cramps / pain  #30 x 0   Entered and Authorized by:   Fidela Salisbury MD   Signed by:   Fidela Salisbury MD on 01/10/2011   Method used:   Print then Give to Patient   RxID:   [id number]   Orders Added: 1)  T-1 View Abdomen (KUB) [74000TC] 2)  Rapid Strep [31438] 3)  T-Comprehensive Metabolic Panel [[id number]] 4)  T-CBC w/Diff [[id number]] 5)  T-Lipase [[id number]] 6)  New Patient Level IV [99204]    Laboratory Results  Date/Time Received: January 10, 2011 12:29 PM  Date/Time Reported: January 10, 2011 12:29 PM   Other Tests  Rapid Strep: negative  Kit Test Internal QC: Negative   (  Normal Range: Negative)

## 2011-05-25 ENCOUNTER — Telehealth: Payer: Self-pay | Admitting: *Deleted

## 2011-05-25 NOTE — Telephone Encounter (Signed)
Needs Tdap and if the health department has told her she needs to be tx then let me know. If they have not contacted her she shouldn't need tx.

## 2011-05-25 NOTE — Telephone Encounter (Signed)
Was expode to whooping cough at work- was a positive culture. Pt was due for tdap 12/2010. What does she need to do

## 2011-05-26 ENCOUNTER — Other Ambulatory Visit: Payer: Self-pay | Admitting: *Deleted

## 2011-05-26 MED ORDER — AZITHROMYCIN 250 MG PO TABS: ORAL_TABLET | ORAL | Status: DC

## 2011-05-26 NOTE — Telephone Encounter (Signed)
Pt called back wanting answers. I read her the response she became very rude. I advised her that I would explain in detail to Dr. Jerilynn Mages her situation and call her back. Pt requested that manager call her back. Dr. Jerilynn Mages approved Zpak after discussing. Abigail Butts called Pt and Zpak was sent for Pt

## 2011-08-14 ENCOUNTER — Encounter: Payer: Self-pay | Admitting: Physician Assistant

## 2011-08-14 ENCOUNTER — Ambulatory Visit (INDEPENDENT_AMBULATORY_CARE_PROVIDER_SITE_OTHER): Payer: BC Managed Care – PPO | Admitting: Physician Assistant

## 2011-08-14 VITALS — BP 117/81 | HR 85 | Temp 98.6°F | Ht 64.0 in | Wt 212.0 lb

## 2011-08-14 DIAGNOSIS — H6692 Otitis media, unspecified, left ear: Secondary | ICD-10-CM

## 2011-08-14 DIAGNOSIS — H6690 Otitis media, unspecified, unspecified ear: Secondary | ICD-10-CM

## 2011-08-14 MED ORDER — AMOXICILLIN-POT CLAVULANATE 875-125 MG PO TABS: 1.0000 | ORAL_TABLET | Freq: Two times a day (BID) | ORAL | Status: AC

## 2011-08-14 MED ORDER — BENZONATATE 100 MG PO CAPS: 100.0000 mg | ORAL_CAPSULE | Freq: Three times a day (TID) | ORAL | Status: DC | PRN

## 2011-08-14 NOTE — Patient Instructions (Addendum)
Consider adding Mucinex twice a day for any congestion. Start Augmentin for ear infection for 10 days twice a day. Tessalon perles for cough up to three times a day as needed. Desylm OTC for cough may also be used. Call office if not improving by weekend.   Otitis Media, Adult A middle ear infection is an infection in the space behind the eardrum. The medical name for this is "otitis media." It may happen after a common cold. It is caused by a germ that starts growing in that space. You may feel swollen glands in your neck on the side of the ear infection. HOME CARE INSTRUCTIONS   Take your medicine as directed until it is gone, even if you feel better after the first few days.   Only take over-the-counter or prescription medicines for pain, discomfort, or fever as directed by your caregiver.   Occasional use of a nasal decongestant a couple times per day may help with discomfort and help the eustachian tube to drain better.  Follow up with your caregiver in 10 to 14 days or as directed, to be certain that the infection has cleared. Not keeping the appointment could result in a chronic or permanent injury, pain, hearing loss and disability. If there is any problem keeping the appointment, you must call back to this facility for assistance. SEEK IMMEDIATE MEDICAL CARE IF:   You are not getting better in 2 to 3 days.   You have pain that is not controlled with medication.   You feel worse instead of better.   You cannot use the medication as directed.   You develop swelling, redness or pain around the ear or stiffness in your neck.  MAKE SURE YOU:   Understand these instructions.   Will watch your condition.   Will get help right away if you are not doing well or get worse.  Document Released: 03/03/2004 Document Revised: 05/18/2011 Document Reviewed: 01/03/2008 [facility] Patient Information 2012 [place].

## 2011-08-14 NOTE — Progress Notes (Signed)
  Subjective:    Patient ID: Stacie Cardenas, female    DOB: 1980-08-17, 31 y.o.   MRN: [id number]  HPI Patient presents to the clinic with sore throat and cough for 5 days. She denies any exposure strep. [name] still able to eat and drink normally. She denies any fever, nausea, vomiting, or chills. She does have some tearfulness and itching bilaterally. She notices that she is very congested when she wakes up but after she isn't to shower her sinuses to open up. She is starting to cough up a lot more mucus that is yellow in color. She has tried take all and over-the-counter cough drops and they have not helped.    Review of Systems     Objective:   Physical Exam  Constitutional: She is oriented to person, place, and time. She appears well-developed and well-nourished.  HENT:  Head: Normocephalic and atraumatic.  Right Ear: External ear normal.  Left Ear: External ear normal.  Mouth/Throat: Oropharynx is clear and moist. No oropharyngeal exudate.       Left TM dull and informatics with some purulent blood External canal.negative for maxillary tenderness to palpation.  Eyes: Conjunctivae are normal.  Neck: Normal range of motion.       Enlarged anterior cervical lymph node on the left side not tender to palpation.  Cardiovascular: Normal rate, regular rhythm and normal heart sounds.   Pulmonary/Chest: Effort normal and breath sounds normal. She has no wheezes.  Neurological: She is alert and oriented to person, place, and time.  Skin: Skin is warm and dry.  Psychiatric: She has a normal mood and affect. Her behavior is normal.          Assessment & Plan:  LOM- Augmentin twice a day for 10 days. Continue with Mucinex twice a day for the next 2 days. Tessalon Perls were given for cough to use up to 3 times a day. Followup with office if not improving in the next 4 days.  Discussed need for complete physical and blood work. Patient is aware of need in stay she was scheduled for this  month.

## 2011-10-17 ENCOUNTER — Encounter: Payer: Self-pay | Admitting: Emergency Medicine

## 2011-10-17 ENCOUNTER — Emergency Department
Admission: EM | Admit: 2011-10-17 | Discharge: 2011-10-17 | Disposition: A | Discharge status: Home / Self Care | Payer: BC Managed Care – PPO | Source: Home / Self Care | Attending: Emergency Medicine | Admitting: Emergency Medicine

## 2011-10-17 DIAGNOSIS — H9209 Otalgia, unspecified ear: Secondary | ICD-10-CM

## 2011-10-17 DIAGNOSIS — L729 Follicular cyst of the skin and subcutaneous tissue, unspecified: Secondary | ICD-10-CM

## 2011-10-17 DIAGNOSIS — L723 Sebaceous cyst: Secondary | ICD-10-CM

## 2011-10-17 HISTORY — DX: Essential (primary) hypertension: I10

## 2011-10-17 MED ORDER — FLUCONAZOLE 150 MG PO TABS: 150.0000 mg | ORAL_TABLET | Freq: Once | ORAL | Status: AC

## 2011-10-17 MED ORDER — TRAMADOL HCL 50 MG PO TABS: 50.0000 mg | ORAL_TABLET | Freq: Four times a day (QID) | ORAL | Status: AC | PRN

## 2011-10-17 MED ORDER — NEOMYCIN-POLYMYXIN-HC 3.5-10000-1 OT SUSP: 4.0000 [drp] | Freq: Three times a day (TID) | OTIC | Status: AC

## 2011-10-17 MED ORDER — DOXYCYCLINE HYCLATE 100 MG PO CAPS: 100.0000 mg | ORAL_CAPSULE | Freq: Two times a day (BID) | ORAL | Status: AC

## 2011-10-17 NOTE — ED Provider Notes (Signed)
History     CSN: [id number]  Arrival date & time 10/17/11  1747   First MD Initiated Contact with Patient 10/17/11 1803      Chief Complaint  Patient presents with  . Otalgia    (Consider location/radiation/quality/duration/timing/severity/associated sxs/prior treatment) HPI This patient complains of left ear pain for last 2 weeks.  She has noticed that she's had a small cyst on the back side of her ear.  She has had cysts in the past which have been treated with antibiotics.  She is currently on ampicillin for acne but prior to that she was on doxycycline.  No fever, chills, nausea, vomiting.  She states that manipulation of the ear and pressing on that cystic inject causes sharp pain and is causing her to have a headache.  [name] been using Tylenol which does not seem to helping her much.  Past Medical History  Diagnosis Date  . Hypertension     History reviewed. No pertinent past surgical history.  No family history on file.  History  Substance Use Topics  . Smoking status: Never Smoker   . Smokeless tobacco: Not on file  . Alcohol Use: No    OB History    Grav Para Term Preterm Abortions TAB SAB Ect Mult Living                  Review of Systems  All other systems reviewed and are negative.    Allergies  Review of patient's allergies indicates not on file.  Home Medications   Current Outpatient Rx  Name Route Sig Dispense Refill  . ATORVASTATIN CALCIUM 20 MG PO TABS Oral Take 20 mg by mouth daily.    Marland Kitchen DOXYCYCLINE HYCLATE 100 MG PO CAPS Oral Take 1 capsule (100 mg total) by mouth 2 (two) times daily. 16 capsule 0  . FLUCONAZOLE 150 MG PO TABS Oral Take 1 tablet (150 mg total) by mouth once. May repeat in 3 days 2 tablet 0  . NEOMYCIN-POLYMYXIN-HC 3.5-10000-1 OT SUSP Left Ear Place 4 drops into the left ear 3 (three) times daily. 7.5 mL 0  . TELMISARTAN-HCTZ 80-12.5 MG PO TABS Oral Take 1 tablet by mouth daily.    . TRAMADOL HCL 50 MG PO TABS Oral Take 1  tablet (50 mg total) by mouth every 6 (six) hours as needed for pain. 15 tablet 0    BP 119/81  Pulse 77  Temp(Src) 98.4 F (36.9 C) (Oral)  Resp 16  Ht 5' 4"  (1.626 m)  Wt 215 lb (97.523 kg)  BMI 36.90 kg/m2  SpO2 100%  Physical Exam  Nursing note and vitals reviewed. Constitutional: She is oriented to person, place, and time. She appears well-developed and well-nourished.  HENT:  Head: Normocephalic and atraumatic.  Right Ear: Tympanic membrane, external ear and ear canal normal.  Ears:       Just posterior to her left ear there is a small 0.75cm cyst that is mildly tender to palpation with no erythema and no signs of active bacterial infection.  Manipulation of her tragus in her pinna causing tenderness.  Examination of her tympanic membrane is normal.  Examination of ear canal is also normal however this does cause her some discomfort as well.  No drainage.  Eyes: No scleral icterus.  Neck: Neck supple.  Cardiovascular: Regular rhythm and normal heart sounds.   Pulmonary/Chest: Effort normal and breath sounds normal. No respiratory distress.  Neurological: She is alert and oriented to person, place, and time.  Skin: Skin is warm and dry.  Psychiatric: She has a normal mood and affect. Her speech is normal.    ED Course  Procedures (including critical care time)  Labs Reviewed - No data to display No results found.   1. Ear pain   2. Scalp cyst       MDM   The small cyst that is behind her ear is likely the cause of her ear pain.  Due to low location I do not feel comfortable incising it today.  I will give her prescription for doxycycline and she would also like a prescription for Diflucan in case she gets a yeast infection.  I also gave her some Ultracet to use for the pain.  In addition I did give her a prescription for antibiotic eardrops as well which may help soothe some of the discomfort of the ear canal itself.  If she is not improving, she will call back and  at that time probably should followup with dermatology to have that incised.  Janeann Forehand, MD 10/17/11 [phone]

## 2011-10-17 NOTE — ED Notes (Signed)
Left ear pain x 2 weeks

## 2012-03-14 ENCOUNTER — Ambulatory Visit (INDEPENDENT_AMBULATORY_CARE_PROVIDER_SITE_OTHER): Payer: BC Managed Care – PPO | Admitting: Family Medicine

## 2012-03-14 ENCOUNTER — Encounter: Payer: Self-pay | Admitting: Family Medicine

## 2012-03-14 VITALS — BP 122/80 | HR 76 | Temp 98.4°F | Ht 64.0 in | Wt 220.0 lb

## 2012-03-14 DIAGNOSIS — I10 Essential (primary) hypertension: Secondary | ICD-10-CM

## 2012-03-14 DIAGNOSIS — E785 Hyperlipidemia, unspecified: Secondary | ICD-10-CM

## 2012-03-14 DIAGNOSIS — Z23 Encounter for immunization: Secondary | ICD-10-CM

## 2012-03-14 DIAGNOSIS — E669 Obesity, unspecified: Secondary | ICD-10-CM

## 2012-03-14 DIAGNOSIS — R5381 Other malaise: Secondary | ICD-10-CM

## 2012-03-14 DIAGNOSIS — Z8639 Personal history of other endocrine, nutritional and metabolic disease: Secondary | ICD-10-CM

## 2012-03-14 DIAGNOSIS — L723 Sebaceous cyst: Secondary | ICD-10-CM

## 2012-03-14 DIAGNOSIS — R5383 Other fatigue: Secondary | ICD-10-CM

## 2012-03-14 DIAGNOSIS — L089 Local infection of the skin and subcutaneous tissue, unspecified: Secondary | ICD-10-CM

## 2012-03-14 LAB — FOLATE: Folate: 5.4 ng/mL

## 2012-03-14 LAB — CBC WITH DIFFERENTIAL/PLATELET
Basophils Absolute: 0 10*3/uL (ref 0.0–0.1)
Eosinophils Absolute: 0.2 10*3/uL (ref 0.0–0.7)
Eosinophils Relative: 2 % (ref 0–5)
HCT: 34.6 % — ABNORMAL LOW (ref 36.0–46.0)
Lymphocytes Relative: 36 % (ref 12–46)
Lymphs Abs: 2.9 10*3/uL (ref 0.7–4.0)
MCH: 23.9 pg — ABNORMAL LOW (ref 26.0–34.0)
MCV: 73.8 fL — ABNORMAL LOW (ref 78.0–100.0)
Monocytes Absolute: 0.5 10*3/uL (ref 0.1–1.0)
RDW: 16.4 % — ABNORMAL HIGH (ref 11.5–15.5)
WBC: 8 10*3/uL (ref 4.0–10.5)

## 2012-03-14 LAB — COMPLETE METABOLIC PANEL WITH GFR
ALT: 20 U/L (ref 0–35)
AST: 20 U/L (ref 0–37)
CO2: 27 mEq/L (ref 19–32)
Calcium: 9.3 mg/dL (ref 8.4–10.5)
Chloride: 105 mEq/L (ref 96–112)
Creat: 0.66 mg/dL (ref 0.50–1.10)
GFR, Est African American: >89 mL/min
Potassium: 4.5 mEq/L (ref 3.5–5.3)
Sodium: 141 mEq/L (ref 135–145)
Total Protein: 7.4 g/dL (ref 6.0–8.3)

## 2012-03-14 LAB — LIPID PANEL
LDL Cholesterol: 144 mg/dL — ABNORMAL HIGH (ref 0–99)
Triglycerides: 116 mg/dL (ref <150)
VLDL: 23 mg/dL (ref 0–40)

## 2012-03-14 LAB — HEMOGLOBIN A1C: Hgb A1c MFr Bld: 6 % — ABNORMAL HIGH (ref <5.7)

## 2012-03-14 MED ORDER — ATORVASTATIN CALCIUM 20 MG PO TABS: 20.0000 mg | ORAL_TABLET | Freq: Every day | ORAL | Status: DC

## 2012-03-14 MED ORDER — CEPHALEXIN 500 MG PO CAPS: 500.0000 mg | ORAL_CAPSULE | Freq: Three times a day (TID) | ORAL | Status: DC

## 2012-03-14 NOTE — Patient Instructions (Addendum)
Schedule follow up in a couple of months for weight loss if you are interested.   Fatigue Fatigue is a feeling of tiredness, lack of energy, lack of motivation, or feeling tired all the time. Having enough rest, good nutrition, and reducing stress will normally reduce fatigue. Consult your caregiver if it persists. The nature of your fatigue will help your caregiver to find out its cause. The treatment is based on the cause.   CAUSES   There are many causes for fatigue. Most of the time, fatigue can be traced to one or more of your habits or routines. Most causes fit into one or more of three general areas. They are: Lifestyle problems  Sleep disturbances.   Overwork.   Physical exertion.   Unhealthy habits.   Poor eating habits or eating disorders.   Alcohol and/or drug use .   Lack of proper nutrition (malnutrition).  Psychological problems  Stress and/or anxiety problems.   Depression.   Grief.   Boredom.  Medical Problems or Conditions  Anemia.   Pregnancy.   Thyroid gland problems.   Recovery from major surgery.   Continuous pain.   Emphysema or asthma that is not well controlled   Allergic conditions.   Diabetes.   Infections (such as mononucleosis).   Obesity.   Sleep disorders, such as sleep apnea.   Heart failure or other heart-related problems.   Cancer.   Kidney disease.   Liver disease.   Effects of certain medicines such as antihistamines, cough and cold remedies, prescription pain medicines, heart and blood pressure medicines, drugs used for treatment of cancer, and some antidepressants.  SYMPTOMS   The symptoms of fatigue include:    Lack of energy.   Lack of drive (motivation).   Drowsiness.   Feeling of indifference to the surroundings.  DIAGNOSIS   The details of how you feel help guide your caregiver in finding out what is causing the fatigue. You will be asked about your present and past health condition. It is important to  review all medicines that you take, including prescription and non-prescription items. A thorough exam will be done. You will be questioned about your feelings, habits, and normal lifestyle. Your caregiver may suggest blood tests, urine tests, or other tests to look for common medical causes of fatigue.   TREATMENT   Fatigue is treated by correcting the underlying cause. For example, if you have continuous pain or depression, treating these causes will improve how you feel. Similarly, adjusting the dose of certain medicines will help in reducing fatigue.   HOME CARE INSTRUCTIONS    Try to get the required amount of good sleep every night.   Eat a healthy and nutritious diet, and drink enough water throughout the day.   Practice ways of relaxing (including yoga or meditation).   Exercise regularly.   Make plans to change situations that cause stress. Act on those plans so that stresses decrease over time. Keep your work and personal routine reasonable.   Avoid street drugs and minimize use of alcohol.   Start taking a daily multivitamin after consulting your caregiver.  SEEK MEDICAL CARE IF:    You have persistent tiredness, which cannot be accounted for.   You have fever.   You have unintentional weight loss.   You have headaches.   You have disturbed sleep throughout the night.   You are feeling sad.   You have constipation.   You have dry skin.   You have gained weight.  You are taking any new or different medicines that you suspect are causing fatigue.   You are unable to sleep at night.   You develop any unusual swelling of your legs or other parts of your body.  SEEK IMMEDIATE MEDICAL CARE IF:    You are feeling confused.   Your vision is blurred.   You feel faint or pass out.   You develop severe headache.   You develop severe abdominal, pelvic, or back pain.   You develop chest pain, shortness of breath, or an irregular or fast heartbeat.   You are  unable to pass a normal amount of urine.   You develop abnormal bleeding such as bleeding from the rectum or you vomit blood.   You have thoughts about harming yourself or committing suicide.   You are worried that you might harm someone else.  MAKE SURE YOU:    Understand these instructions.   Will watch your condition.   Will get help right away if you are not doing well or get worse.  Document Released: 03/26/2007 Document Revised: 08/21/2011 Document Reviewed: 03/26/2007 [facility] Patient Information 2013 [place].

## 2012-03-14 NOTE — Progress Notes (Addendum)
  Subjective:    Patient ID: Stacie Cardenas, female    DOB: 04-26-1981, 31 y.o.   MRN: [id number]  HPI Had a tummy tuck about 3-4 years ago and noticed a bump on her belly.  She says she occasionally gets bumps like this. If they can resolve on her own. She has been applying warm compresses. He started to look red this morning. No fever.   She has been really stressed at work.  Says has been extremely fatigued. Sleeping well. Hx of iron def anemia. Working 10 hour days. She is in school.  Has 3 kids. N othyroid problems.  No fever.      Review of Systems     Objective:   Physical Exam  Constitutional: She is oriented to person, place, and time. She appears well-developed and well-nourished.  HENT:  Head: Normocephalic and atraumatic.  Neck: Neck supple. No thyromegaly present.  Cardiovascular: Normal rate, regular rhythm and normal heart sounds.   Pulmonary/Chest: Effort normal and breath sounds normal.  Lymphadenopathy:    She has no cervical adenopathy.  Neurological: She is alert and oriented to person, place, and time.  Skin: Skin is warm and dry.       Small 1 cm paplable nodule under the skin the lower abdomen. There is some erythema over the lesion. No open wounds or drainage. This is slightly enlarged pore at the Center.  Psychiatric: She has a normal mood and affect. Her behavior is normal.          Assessment & Plan:  Infected seb cyst on lower abdomen - Start keflex TID.  Call if not resolving. If the nodule still remains I recommend she followup so that we can [place] it.  Fatigue - Discussed wil check labs including TSH, CMP, CBC, ferritin, b12, an dfolate. We'll call her with results. Also suspect a lot of her fatigue is because she trying to go to school and working full-time, going to school and taking care of her 3 children and not getting enough sleep.  Given work note for today.  Abnormal weight gain-she's also interested in weight loss medication. Her that if she  does have iron deficiency and want to correct this first that she can maximize her diet and exercise capability if we are going to start a weight loss medication. We did discuss some the present cons of possibly using phentermine. If she's anemic underwent overstress her heart with the stimulant. Also splint her that her blood pressure has been well controlled which it is today.  Hypertension-well-controlled today.  Hyperlipidemia-overdue for lipids. Over a year. She does need a refill on her Lipitor.

## 2012-03-15 ENCOUNTER — Other Ambulatory Visit: Payer: Self-pay | Admitting: Family Medicine

## 2012-03-15 ENCOUNTER — Encounter: Payer: Self-pay | Admitting: *Deleted

## 2012-03-15 MED ORDER — ATORVASTATIN CALCIUM 40 MG PO TABS: 40.0000 mg | ORAL_TABLET | Freq: Every day | ORAL | Status: DC

## 2012-04-21 ENCOUNTER — Encounter (HOSPITAL_COMMUNITY): Payer: Self-pay | Admitting: Obstetrics and Gynecology

## 2012-04-21 ENCOUNTER — Inpatient Hospital Stay (HOSPITAL_COMMUNITY)
Admission: AD | Admit: 2012-04-21 | Discharge: 2012-04-21 | Disposition: A | Discharge status: Home / Self Care | Payer: BC Managed Care – PPO | Source: Ambulatory Visit | Attending: Obstetrics & Gynecology | Admitting: Obstetrics & Gynecology

## 2012-04-21 DIAGNOSIS — N907 Vulvar cyst: Secondary | ICD-10-CM

## 2012-04-21 DIAGNOSIS — N9089 Other specified noninflammatory disorders of vulva and perineum: Secondary | ICD-10-CM | POA: Insufficient documentation

## 2012-04-21 HISTORY — DX: Pure hypercholesterolemia, unspecified: E78.00

## 2012-04-21 MED ORDER — IBUPROFEN 600 MG PO TABS: 600.0000 mg | ORAL_TABLET | Freq: Four times a day (QID) | ORAL | Status: DC | PRN

## 2012-04-21 MED ORDER — OXYCODONE-ACETAMINOPHEN 5-325 MG PO TABS
1.0000 | ORAL_TABLET | Freq: Once | ORAL | Status: AC
  Administered 2012-04-21: 2 via ORAL
  Filled 2012-04-21: qty 2

## 2012-04-21 MED ORDER — OXYCODONE-ACETAMINOPHEN 5-325 MG PO TABS: 1.0000 | ORAL_TABLET | ORAL | Status: DC | PRN

## 2012-04-21 MED ORDER — LIDOCAINE HCL 2 % EX GEL: Freq: Once | CUTANEOUS | Status: DC

## 2012-04-21 MED ORDER — LIDOCAINE HCL (PF) 1 % IJ SOLN
INTRAMUSCULAR | Status: AC
  Administered 2012-04-21: 30 mL
  Filled 2012-04-21: qty 30

## 2012-04-21 MED ORDER — SULFAMETHOXAZOLE-TRIMETHOPRIM 800-160 MG PO TABS: 1.0000 | ORAL_TABLET | Freq: Two times a day (BID) | ORAL | Status: DC

## 2012-04-21 NOTE — MAU Note (Signed)
Patient states she has a cyst on the inner labia on the left side that started on 11-7. Has had this same abscess before and had it I & D.

## 2012-04-21 NOTE — MAU Provider Note (Signed)
Attestation of Attending Supervision of Advanced Practitioner (CNM/NP): Evaluation and management procedures were performed by the Advanced Practitioner under my supervision and collaboration.  I have reviewed the Advanced Practitioner's note and chart, and I agree with the management and plan.  HARRAWAY-SMITH, Iridiana Fonner 3:50 PM

## 2012-04-21 NOTE — MAU Provider Note (Signed)
CC: Abscess    First Provider Initiated Contact with Patient 04/21/12 1413      HPI DELETHA JAFFEE ia a 31 y.o. [id number] who presents with 3 day history of painful "cyst" in left vaginal area. She tried warm compresses. States it is not draining. Had similar lesion about a year ago. Denies shaving or waxing in that area. Had a hemoglobin A1c of 6 on 03/15/2012. LMP 04/09/12. Denies abnormal bleeding. Declines STI testing.  Past Medical History  Diagnosis Date  . Hypertension   . Hypercholesterolemia     OB History    Grav Para Term Preterm Abortions TAB SAB Ect Mult Living   3 3 3  0 0 0 0 0 0 3     # Outc Date GA Lbr Len/2nd Wgt Sex Del Anes PTL Lv   1 TRM            2 TRM            3 TRM               Past Surgical History  Procedure Date  . Cesarean section     History   Social History  . Marital Status: Married    Spouse Name: N/A    Number of Children: N/A  . Years of Education: N/A   Occupational History  . Not on file.   Social History Main Topics  . Smoking status: Never Smoker   . Smokeless tobacco: Not on file  . Alcohol Use: No  . Drug Use: No  . Sexually Active: Yes    Birth Control/ Protection: Surgical   Other Topics Concern  . Not on file   Social History Narrative  . No narrative on file    No current facility-administered medications on file prior to encounter.   Current Outpatient Prescriptions on File Prior to Encounter  Medication Sig Dispense Refill  . atorvastatin (LIPITOR) 40 MG tablet Take 1 tablet (40 mg total) by mouth daily.  30 tablet  11  . cephALEXin (KEFLEX) 500 MG capsule Take 1 capsule (500 mg total) by mouth 3 (three) times daily.  30 capsule  0  . telmisartan-hydrochlorothiazide (MICARDIS HCT) 80-12.5 MG per tablet Take 1 tablet by mouth daily.        No Known Allergies  ROS Pertinent items in HPI  PHYSICAL EXAM General: Well nourished, well developed female in no acute distress Cardiovascular: Normal  rate Respiratory: Normal effort Abdomen: Soft, nontender Extremities: No edema Neurologic: Alert and oriented EXT GENITALIA: left labium majorum upper inner aspect with 1.5 cm firm tender lesion.  No surrounding erythema. Lower labium has two 2-3 mm follicular lesions which are nontender and have come to a head.  LAB RESULTS No results found for this or any previous visit (from the past 24 hour(s)).  MAU COuRSE:  Percpcet 2 po  Procedure note: Sebacceous Cyst I&D   Informed consent obtained. Discussed complications and possible outcomes of procedure including recurrence of cyst, scarring leading to infecton, bleeding, dyspareunia, distortion of anatomy.  Patient was examined in the dorsal lithotomy position and mass was identified.  The area was prepped with Iodine and draped in a sterile manner. 1% Lidocaine (1 ml) was then used to infiltrate area on top of the cyst  A 7 mm incision was made using a sterile scapel. Upon palpation of the mass, a moderate amount of  purulent drainage was expressed through the incision.  The open cyst was then irrigated with normal  saline- Bactrim DS bid x 7 days for treatment - Recommended Sitz baths bid and Motrin and Percocet was given  prn pain.   She was told to call to be examined if she experiences increasing swelling, pain, vaginal discharge, or fever.  - She was instructed to wear a peripad to absorb discharge, and to maintain pelvic rest   ASSESSMENT 1. Sebaceous cyst of labia     PLAN Discharge home. See AVS for patient education   Medication List     As of 04/21/2012  2:14 PM    ASK your doctor about these medications         atorvastatin 40 MG tablet   Commonly known as: LIPITOR   Take 1 tablet (40 mg total) by mouth daily.      cephALEXin 500 MG capsule   Commonly known as: KEFLEX   Take 1 capsule (500 mg total) by mouth 3 (three) times daily.      telmisartan-hydrochlorothiazide 80-12.5 MG per tablet   Commonly known as: MICARDIS  HCT   Take 1 tablet by mouth daily.       F/U with PMD, Dr. Suzi Roots in 2 wks,  Lorene Dy, CNM 04/21/2012 2:14 PM

## 2012-08-06 ENCOUNTER — Ambulatory Visit (INDEPENDENT_AMBULATORY_CARE_PROVIDER_SITE_OTHER): Payer: BC Managed Care – PPO | Admitting: Family Medicine

## 2012-08-06 ENCOUNTER — Encounter: Payer: Self-pay | Admitting: Family Medicine

## 2012-08-06 VITALS — BP 134/85 | HR 79 | Wt 223.0 lb

## 2012-08-06 DIAGNOSIS — G5602 Carpal tunnel syndrome, left upper limb: Secondary | ICD-10-CM

## 2012-08-06 DIAGNOSIS — G5600 Carpal tunnel syndrome, unspecified upper limb: Secondary | ICD-10-CM

## 2012-08-06 DIAGNOSIS — R5381 Other malaise: Secondary | ICD-10-CM

## 2012-08-06 DIAGNOSIS — R51 Headache: Secondary | ICD-10-CM

## 2012-08-06 DIAGNOSIS — D509 Iron deficiency anemia, unspecified: Secondary | ICD-10-CM

## 2012-08-06 DIAGNOSIS — R5383 Other fatigue: Secondary | ICD-10-CM

## 2012-08-06 DIAGNOSIS — I10 Essential (primary) hypertension: Secondary | ICD-10-CM

## 2012-08-06 LAB — IRON AND TIBC
%SAT: 6 % — ABNORMAL LOW (ref 20–55)
Iron: 20 ug/dL — ABNORMAL LOW (ref 42–145)
TIBC: 352 ug/dL (ref 250–470)
UIBC: 332 ug/dL (ref 125–400)

## 2012-08-06 LAB — CBC
Hemoglobin: 10.4 g/dL — ABNORMAL LOW (ref 12.0–15.0)
Platelets: 284 10*3/uL (ref 150–400)
RBC: 4.66 MIL/uL (ref 3.87–5.11)
WBC: 8.4 10*3/uL (ref 4.0–10.5)

## 2012-08-06 MED ORDER — KETOROLAC TROMETHAMINE 60 MG/2ML IM SOLN
60.0000 mg | Freq: Once | INTRAMUSCULAR | Status: AC
  Administered 2012-08-06: 60 mg via INTRAMUSCULAR

## 2012-08-06 NOTE — Progress Notes (Signed)
CC: Stacie Cardenas is a 32 y.o. female is here for Headaches   Subjective: HPI:  Patient presents with complaints of headache. It has been present since Thursday, it has waxed and waned from a mild to moderate severity on a daily basis but not predictable to time of day. Interventions have included Tylenol and Aleve without much improvement. Headache does not wake her from her sleep, it is not the worse headache of her life, but does feel somewhat different than her typical migraines. Described as a pressure in the for head and sometimes right temple that is nonradiating. Nothing particularly makes it better or worse. she denies recent trauma nor any recent or remote motor or sensory disturbances other than occasional tingling in her left fingertips.  She denies fevers, photophobia, phonophobia, rashes, neck pain.  She admits worsening fatigue and some mild shortness of breath with exertion. She denies recent or remote nasal congestion  She admits to tingling in the left hand that is worse at night. Sometimes present with repetitive movements during the day. improves greatly with shaking her hand. It is localized only to the tips of the index and middle finger. She denies weakness or other sensory disturbances in this extremity or elsewhere. No interventions as of yet. She denies muscle loss in the affected hand. Symptoms are mild when present.  She suspicious that her headache may be due to elevated blood pressure. Her employer has been checking her blood pressure this past week and she reports consistent blood pressure near 120/70. [name] taking Micardis HCT on a daily basis. She denies chest pain, orthopnea, peripheral edema, confusion.  [name] a history of iron deficiency but admits that she has not been taking over-the-counter iron since her last visit due to constipation     Review Of Systems Outlined In HPI  Past Medical History  Diagnosis Date  . Hypertension   . Hypercholesterolemia       History reviewed. No pertinent family history.   History  Substance Use Topics  . Smoking status: Never Smoker   . Smokeless tobacco: Not on file  . Alcohol Use: No     Objective: Filed Vitals:   08/06/12 1033  BP: 134/85  Pulse: 79    General: Alert and Oriented, No Acute Distress HEENT: Pupils equal, round, reactive to light. Conjunctivae clear.  External ears unremarkable, canals clear with intact TMs with appropriate landmarks.  Middle ear appears open without effusion. Pink inferior turbinates.  Moist mucous membranes, pharynx without inflammation nor lesions.  Neck supple without palpable lymphadenopathy nor abnormal masses. Lungs: Clear to auscultation bilaterally, no wheezing/ronchi/rales.  Comfortable work of breathing. Good air movement. Cardiac: Regular rate and rhythm. Normal S1/S2.  No murmurs, rubs, nor gallops.   Neuro: CN II-XII grossly intact, full strength/rom of all four extremities, C5/L4/S1 DTRs 2/4 bilaterally, gait normal, rapid alternating movements normal, heel-shin test normal, Rhomberg normal. Extremities: No peripheral edema.  Strong peripheral pulses.  Phalen's negative, Tinel's negative on the left. Mental Status: No depression, anxiety, nor agitation. Skin: Warm and dry.  Assessment & Plan: Stacie Cardenas was seen today for headaches.  Diagnoses and associated orders for this visit:  IRON DEFICIENCY - CBC - Iron and TIBC  HYPERTENSION, BENIGN ESSENTIAL  Other malaise and fatigue - CBC - Iron and TIBC  Headache - CBC - Iron and TIBC  Carpal tunnel syndrome, left    Headache: Low suspicion of this is due to uncontrolled blood pressure. Possibly due to iron deficiency and anemia, we'll  investigate today with blood work, patient is agreeable to starting MiraLax and iron sulfate if needed.  I believe this may be playing into her fatigue as well. 60 mg Toradol given today. Essential hypertension: Controlled, continue micardis hct regimen Carpal  tunnel syndrome: Symptoms are suspicious for CTS, handout given regarding stretching, if not improved in 2 weeks return for reevaluation.   Return if symptoms worsen or fail to improve.

## 2012-08-07 ENCOUNTER — Telehealth: Payer: Self-pay | Admitting: Family Medicine

## 2012-08-07 DIAGNOSIS — D509 Iron deficiency anemia, unspecified: Secondary | ICD-10-CM

## 2012-08-07 MED ORDER — FERROUS SULFATE 325 (65 FE) MG PO TBEC: 325.0000 mg | DELAYED_RELEASE_TABLET | Freq: Two times a day (BID) | ORAL | Status: DC

## 2012-08-07 MED ORDER — POLYETHYLENE GLYCOL 3350 17 GM/SCOOP PO POWD: 17.0000 g | Freq: Every day | ORAL | Status: DC

## 2012-08-07 NOTE — Telephone Encounter (Signed)
Seth Bake, Will you please let Mrs. Stacie Cardenas know that her blood work revealed a worsening iron deficiency anemia and I'm not surprised she's feeling fatigued.  This may be contributing to her headaches as well.  I would strongly encourage her to start taking iron at least once a day.  I have sent an Rx to her [facility] on [address] in case her insurance will cover any portion of it.  It is Ferrous Sulfate 385m to be taken ideally twice a day, an Rx for miralax was also sent in to help prevent constipation.  I would encourage her to follow up with her PCP in two weeks.

## 2012-08-07 NOTE — Telephone Encounter (Signed)
Pt.notified

## 2012-09-04 ENCOUNTER — Other Ambulatory Visit: Payer: Self-pay | Admitting: Physician Assistant

## 2012-10-14 ENCOUNTER — Telehealth: Payer: Self-pay | Admitting: Physician Assistant

## 2012-10-14 ENCOUNTER — Ambulatory Visit (INDEPENDENT_AMBULATORY_CARE_PROVIDER_SITE_OTHER): Payer: BC Managed Care – PPO | Admitting: Physician Assistant

## 2012-10-14 ENCOUNTER — Encounter: Payer: Self-pay | Admitting: Physician Assistant

## 2012-10-14 VITALS — BP 120/74 | HR 74 | Wt 220.0 lb

## 2012-10-14 DIAGNOSIS — L299 Pruritus, unspecified: Secondary | ICD-10-CM

## 2012-10-14 DIAGNOSIS — H04209 Unspecified epiphora, unspecified lacrimal gland: Secondary | ICD-10-CM

## 2012-10-14 DIAGNOSIS — R0981 Nasal congestion: Secondary | ICD-10-CM

## 2012-10-14 DIAGNOSIS — J3489 Other specified disorders of nose and nasal sinuses: Secondary | ICD-10-CM

## 2012-10-14 DIAGNOSIS — E785 Hyperlipidemia, unspecified: Secondary | ICD-10-CM

## 2012-10-14 DIAGNOSIS — E669 Obesity, unspecified: Secondary | ICD-10-CM

## 2012-10-14 DIAGNOSIS — H938X3 Other specified disorders of ear, bilateral: Secondary | ICD-10-CM

## 2012-10-14 DIAGNOSIS — H938X9 Other specified disorders of ear, unspecified ear: Secondary | ICD-10-CM

## 2012-10-14 MED ORDER — FLUTICASONE PROPIONATE 50 MCG/ACT NA SUSP: 2.0000 | Freq: Every day | NASAL | Status: DC

## 2012-10-14 MED ORDER — CETIRIZINE-PSEUDOEPHEDRINE ER 5-120 MG PO TB12: 1.0000 | ORAL_TABLET | Freq: Two times a day (BID) | ORAL | Status: DC

## 2012-10-14 MED ORDER — PHENTERMINE HCL 37.5 MG PO CAPS: 37.5000 mg | ORAL_CAPSULE | ORAL | Status: DC

## 2012-10-14 MED ORDER — SIMVASTATIN 20 MG PO TABS: 20.0000 mg | ORAL_TABLET | Freq: Every evening | ORAL | Status: DC

## 2012-10-14 NOTE — Progress Notes (Addendum)
  Subjective:    Patient ID: Stacie Cardenas, female    DOB: 02-02-1981, 32 y.o.   MRN: [id number]  HPI Patient presents to clinic with allergy symptoms of watery itching eyes, ear itchiness, nasal congestion and itchy throat. Symptoms started about 3 days ago but worsened after being outside yesterday in the wind. No history of allergies but her daughter does have a lot of allergies. She denies any fever, SOB, palipiations. Denies any drainage from ears or pain. They feel really congested. Also reports can not breathe out of nose only mouth. Recently did have left ear abcess treated with abx and drops. Resolved.   Hyperlipidiema- Patient brings in labs at a health screening. LDL is 136 and elevated.TG 215. A1C is 6.2. Pt admits to not taking lipitor because of side effects she has heard about not because she has experienced any of the side effects. Been off for 4 months would like to try something else.   Obese- Discussed had been on phentermine in the past. Admits to not exercising and eating like she should. Has tried phentermine in the past and worked well with out side effects.        Review of Systems     Objective:   Physical Exam  Constitutional: She is oriented to person, place, and time. She appears well-developed and well-nourished.  Obese.  HENT:  Head: Normocephalic and atraumatic.  Mouth/Throat: Oropharynx is clear and moist.  Bilateral TM dullness no erythema, discharge, pus, or blood.  Negative for sinus pressure.  Bilateral turbinates red and swollen.  Eyes:  Watery discharge bilaterally with erythema.  Neck: Normal range of motion. Neck supple.  Cardiovascular: Normal rate, regular rhythm and normal heart sounds.   Pulmonary/Chest: Effort normal and breath sounds normal. She has no wheezes.  Lymphadenopathy:    She has no cervical adenopathy.  Neurological: She is alert and oriented to person, place, and time.  Skin: Skin is warm and dry.  Psychiatric: She has a  normal mood and affect. Her behavior is normal.          Assessment & Plan:  Ear itching/watery eyes/nasal congestion- discussed with patient that it sounds and appears like allergies. Will start zyrtec and flonase. Follow up if not improving. Discussed with patient there was some dullness to TM bilaterally that could develop into an infection if not able to get fluid out.   Obesity- gave rx for phentermine follow up in 1 month. Discussed with patient works best in combination with diet and exercise. Discussed side effects of insomnia, HR, BP changes. Will monitor.   Hyperlipidemia- Start zocor. Will check in 3 months cholesterol.

## 2012-10-14 NOTE — Patient Instructions (Addendum)
Zyrtec daily. Will send flonase 2 sprays daily. Call if not improving in next week or worsening.   Switched lipitor to zocor will recheck in 3 months.   Started phentermine daily. Follow up in 1 month.

## 2012-10-14 NOTE — Telephone Encounter (Signed)
Call and make sure patient is aware that zocor for cholesterol was sent to pharmacy for cholesterol. Start taking and will recheck in 3 months.

## 2012-10-15 NOTE — Telephone Encounter (Signed)
Left detailed message on machine.

## 2012-11-12 ENCOUNTER — Other Ambulatory Visit: Payer: Self-pay | Admitting: Physician Assistant

## 2012-11-14 ENCOUNTER — Ambulatory Visit (INDEPENDENT_AMBULATORY_CARE_PROVIDER_SITE_OTHER): Payer: BC Managed Care – PPO | Admitting: Family Medicine

## 2012-11-14 ENCOUNTER — Encounter: Payer: Self-pay | Admitting: *Deleted

## 2012-11-14 VITALS — BP 130/84 | HR 96 | Wt 210.0 lb

## 2012-11-14 DIAGNOSIS — R635 Abnormal weight gain: Secondary | ICD-10-CM

## 2012-11-14 MED ORDER — PHENTERMINE HCL 37.5 MG PO CAPS: 37.5000 mg | ORAL_CAPSULE | ORAL | Status: DC

## 2012-11-14 NOTE — Progress Notes (Signed)
  Subjective:    Patient ID: Stacie Cardenas, female    DOB: July 23, 1980, 32 y.o.   MRN: [id number] Pt here for a bp & weight check.  Weight is 210lbs.  bp today is 130/84 pulse 96. HPI    Review of Systems     Objective:   Physical Exam        Assessment & Plan:  Abnormal weight gain-blood pressure looks well controlled today. [name] been fantastic on the weight loss. [name] tolerating the medication without any side effects such as chest pain shortness of breath or palpitations. Refill phentermine for one more month. Followup in one month for repeat blood pressure weight check. Beatrice Lecher, MD

## 2012-12-04 ENCOUNTER — Ambulatory Visit (INDEPENDENT_AMBULATORY_CARE_PROVIDER_SITE_OTHER): Payer: BC Managed Care – PPO | Admitting: Physician Assistant

## 2012-12-04 ENCOUNTER — Ambulatory Visit (INDEPENDENT_AMBULATORY_CARE_PROVIDER_SITE_OTHER): Payer: BC Managed Care – PPO

## 2012-12-04 ENCOUNTER — Encounter: Payer: Self-pay | Admitting: Physician Assistant

## 2012-12-04 VITALS — BP 135/92 | HR 79 | Wt 207.0 lb

## 2012-12-04 DIAGNOSIS — R1031 Right lower quadrant pain: Secondary | ICD-10-CM

## 2012-12-04 DIAGNOSIS — R198 Other specified symptoms and signs involving the digestive system and abdomen: Secondary | ICD-10-CM

## 2012-12-04 DIAGNOSIS — R109 Unspecified abdominal pain: Secondary | ICD-10-CM

## 2012-12-04 LAB — CBC WITH DIFFERENTIAL/PLATELET
Eosinophils Absolute: 0.3 10*3/uL (ref 0.0–0.7)
Eosinophils Relative: 4 % (ref 0–5)
Hemoglobin: 10.2 g/dL — ABNORMAL LOW (ref 12.0–15.0)
Lymphs Abs: 3.4 10*3/uL (ref 0.7–4.0)
MCH: 21.2 pg — ABNORMAL LOW (ref 26.0–34.0)
MCV: 66.9 fL — ABNORMAL LOW (ref 78.0–100.0)
Monocytes Relative: 9 % (ref 3–12)
RBC: 4.81 MIL/uL (ref 3.87–5.11)

## 2012-12-04 MED ORDER — PHENTERMINE HCL 37.5 MG PO CAPS: 37.5000 mg | ORAL_CAPSULE | ORAL | Status: DC

## 2012-12-04 NOTE — Patient Instructions (Addendum)
Priobiotics Align. Consider daily. miralax daily.   1 week of bactroban in navel.   Ibuprofen for up to 800 mg up to three times a day.   Irritable Bowel Syndrome Irritable Bowel Syndrome (IBS) is caused by a disturbance of normal bowel function. Other terms used are spastic colon, mucous colitis, and irritable colon. It does not require surgery, nor does it lead to cancer. There is no cure for IBS. But with proper diet, stress reduction, and medication, you will find that your problems (symptoms) will gradually disappear or improve. IBS is a common digestive disorder. It usually appears in late adolescence or early adulthood. Women develop it twice as often as men. CAUSES  After food has been digested and absorbed in the small intestine, waste material is moved into the colon (large intestine). In the colon, water and salts are absorbed from the undigested products coming from the small intestine. The remaining residue, or fecal material, is held for elimination. Under normal circumstances, gentle, rhythmic contractions on the bowel walls push the fecal material along the colon towards the rectum. In IBS, however, these contractions are irregular and poorly coordinated. The fecal material is either retained too long, resulting in constipation, or expelled too soon, producing diarrhea. SYMPTOMS  The most common symptom of IBS is pain. It is typically in the lower left side of the belly (abdomen). But it may occur anywhere in the abdomen. It can be felt as heartburn, backache, or even as a dull pain in the arms or shoulders. The pain comes from excessive bowel-muscle spasms and from the buildup of gas and fecal material in the colon. This pain:  Can range from sharp belly (abdominal) cramps to a dull, continuous ache.  Usually worsens soon after eating.  Is typically relieved by having a bowel movement or passing gas. Abdominal pain is usually accompanied by constipation. But it may also produce  diarrhea. The diarrhea typically occurs right after a meal or upon arising in the morning. The stools are typically soft and watery. They are often flecked with secretions (mucus). Other symptoms of IBS include:  Bloating.  Loss of appetite.  Heartburn.  Feeling sick to your stomach (nausea).  Belching  Vomiting  Gas. IBS may also cause a number of symptoms that are unrelated to the digestive system:  Fatigue.  Headaches.  Anxiety  Shortness of breath  Difficulty in concentrating.  Dizziness. These symptoms tend to come and go. DIAGNOSIS  The symptoms of IBS closely mimic the symptoms of other, more serious digestive disorders. So your caregiver may wish to perform a variety of additional tests to exclude these disorders. He/she wants to be certain of learning what is wrong (diagnosis). The nature and purpose of each test will be explained to you. TREATMENT A number of medications are available to help correct bowel function and/or relieve bowel spasms and abdominal pain. Among the drugs available are:  Mild, non-irritating laxatives for severe constipation and to help restore normal bowel habits.  Specific anti-diarrheal medications to treat severe or prolonged diarrhea.  Anti-spasmodic agents to relieve intestinal cramps.  Your caregiver may also decide to treat you with a mild tranquilizer or sedative during unusually stressful periods in your life. The important thing to remember is that if any drug is prescribed for you, make sure that you take it exactly as directed. Make sure that your caregiver knows how well it worked for you. HOME CARE INSTRUCTIONS   Avoid foods that are high in fat or oils.  Some examples UHK:ISNGX cream, butter, frankfurters, sausage, and other fatty meats.  Avoid foods that have a laxative effect, such as fruit, fruit juice, and dairy products.  Cut out carbonated drinks, chewing gum, and "gassy" foods, such as beans and cabbage. This may  help relieve bloating and belching.  Bran taken with plenty of liquids may help relieve constipation.  Keep track of what foods seem to trigger your symptoms.  Avoid emotionally charged situations or circumstances that produce anxiety.  Start or continue exercising.  Get plenty of rest and sleep. MAKE SURE YOU:   Understand these instructions.  Will watch your condition.  Will get help right away if you are not doing well or get worse. Document Released: 05/29/2005 Document Revised: 08/21/2011 Document Reviewed: 01/17/2008 [facility] Patient Information 2014 [place].

## 2012-12-04 NOTE — Progress Notes (Signed)
  Subjective:    Patient ID: Stacie Cardenas, female    DOB: [birth year], 32 y.o.   MRN: [id number]  HPI Patient presents to the clinic with discharge coming from belly button. She has had a tummy tuck and ever since had a very deep umbilicus. She cleans it out every day with alcohol and qtip or cotton ball. 6 days ago she noticed a little bit of blood on cotton ball. She denies any pain or trauma associated with it. She has had some abdominal bloating and generalized discomfort in the lower abdomen. Discharge is whitish/yellow. No bowel changes, fever, chills, n/v/d. She always is a little constipation. She does not take anything to make her more regular. She has kept cleaning out with alcohol but nothing seems to stop discharge.    Review of Systems     Objective:   Physical Exam  Constitutional: She is oriented to person, place, and time. She appears well-developed and well-nourished.  HENT:  Head: Normocephalic and atraumatic.  Cardiovascular: Normal rate, regular rhythm and normal heart sounds.   Pulmonary/Chest: Effort normal and breath sounds normal.  Abdominal: Soft. Bowel sounds are normal. She exhibits no distension.  Discomfort with palpation over entire lower abdomen. No signs of hernia around umbilicus.   Neurological: She is alert and oriented to person, place, and time.  Skin: Skin is warm and dry.  Psychiatric: She has a normal mood and affect. Her behavior is normal.          Assessment & Plan:  umbilicus discharge/RUQ pain- I see no signs of infection. Will get CBC. Will also check abd xray to make sure not constipated causing abdominal discomfort. Consider Miralax daily and probiotic. Gave Bactroban to place in umbilicus for the next couple of days then continue to clean with alcohol. Ibuprofen 853m for abdominal pain. If not improving can consider further GI testing.

## 2013-01-09 ENCOUNTER — Encounter: Payer: Self-pay | Admitting: Family Medicine

## 2013-01-09 ENCOUNTER — Ambulatory Visit (INDEPENDENT_AMBULATORY_CARE_PROVIDER_SITE_OTHER): Payer: BC Managed Care – PPO | Admitting: Family Medicine

## 2013-01-09 VITALS — BP 138/94 | HR 80 | Temp 98.3°F | Wt 205.0 lb

## 2013-01-09 DIAGNOSIS — H6092 Unspecified otitis externa, left ear: Secondary | ICD-10-CM

## 2013-01-09 DIAGNOSIS — I10 Essential (primary) hypertension: Secondary | ICD-10-CM

## 2013-01-09 DIAGNOSIS — H60399 Other infective otitis externa, unspecified ear: Secondary | ICD-10-CM

## 2013-01-09 MED ORDER — NEOMYCIN-POLYMYXIN-HC 3.5-10000-1 OT SOLN: 3.0000 [drp] | Freq: Three times a day (TID) | OTIC | Status: AC

## 2013-01-09 MED ORDER — TELMISARTAN-HCTZ 80-12.5 MG PO TABS: 1.0000 | ORAL_TABLET | Freq: Every day | ORAL | Status: DC

## 2013-01-09 NOTE — Progress Notes (Signed)
  Subjective:    Patient ID: Stacie Cardenas, female    DOB: 1981-05-25, 32 y.o.   MRN: [id number]  HPI Left ear pain - x 4 days. No drainage.  Not swimming. No fever or chills. No ST.  No hearing changes.  Occ get ear infections so wanted to come in and be checked.  She has been taking her zyrtec regularly. Some mild congestion. No headache.  HTN-  Pt denies chest pain, SOB, dizziness, or heart palpitations.  Taking meds as directed w/o problems.  Denies medication side effects.   Review of Systems     Objective:   Physical Exam  Constitutional: She is oriented to person, place, and time.  HENT:  Head: Normocephalic and atraumatic.  Right Ear: External ear normal.  Nose: Nose normal.  Mouth/Throat: Oropharynx is clear and moist.  Left canal is midly erythematous with some white debris.  No fluid behind the TM  Neurological: She is alert and oriented to person, place, and time.          Assessment & Plan:  Left OM - will tx with cortisporin. Call if not improving. H.O given.   HTN - uncontrolled. Her diastolics is elevated today. This could be secondary to discomfort and pain. Refilled meds. The last time she tried to fill her Micardis it was $40. She does have new insurance now so she would like to see how much it is with her new plan. If it's still expensive she will call us back and we can change her medication. We do have some coupon cards for Benicar HCT as well as Edarbyclor, which are very reasonable.

## 2013-01-09 NOTE — Patient Instructions (Signed)
Otitis Externa Otitis externa is a bacterial or fungal infection of the outer ear canal. This is the area from the eardrum to the outside of the ear. Otitis externa is sometimes called "swimmer's ear." CAUSES  Possible causes of infection include:  Swimming in dirty water.  Moisture remaining in the ear after swimming or bathing.  Mild injury (trauma) to the ear.  Objects stuck in the ear (foreign body).  Cuts or scrapes (abrasions) on the outside of the ear. SYMPTOMS  The first symptom of infection is often itching in the ear canal. Later signs and symptoms may include swelling and redness of the ear canal, ear pain, and yellowish-white fluid (pus) coming from the ear. The ear pain may be worse when pulling on the earlobe. DIAGNOSIS  Your caregiver will perform a physical exam. A sample of fluid may be taken from the ear and examined for bacteria or fungi. TREATMENT  Antibiotic ear drops are often given for 10 to 14 days. Treatment may also include pain medicine or corticosteroids to reduce itching and swelling. PREVENTION   Keep your ear dry. Use the corner of a towel to absorb water out of the ear canal after swimming or bathing.  Avoid scratching or putting objects inside your ear. This can damage the ear canal or remove the protective wax that lines the canal. This makes it easier for bacteria and fungi to grow.  Avoid swimming in lakes, polluted water, or poorly chlorinated pools.  You may use ear drops made of rubbing alcohol and vinegar after swimming. Combine equal parts of white vinegar and alcohol in a bottle. Put 3 or 4 drops into each ear after swimming. HOME CARE INSTRUCTIONS   Apply antibiotic ear drops to the ear canal as prescribed by your caregiver.  Only take over-the-counter or prescription medicines for pain, discomfort, or fever as directed by your caregiver.  If you have diabetes, follow any additional treatment instructions from your caregiver.  Keep all  follow-up appointments as directed by your caregiver. SEEK MEDICAL CARE IF:   You have a fever.  Your ear is still red, swollen, painful, or draining pus after 3 days.  Your redness, swelling, or pain gets worse.  You have a severe headache.  You have redness, swelling, pain, or tenderness in the area behind your ear. MAKE SURE YOU:   Understand these instructions.  Will watch your condition.  Will get help right away if you are not doing well or get worse. Document Released: 05/29/2005 Document Revised: 08/21/2011 Document Reviewed: 06/15/2011 [facility] Patient Information 2014 [place].

## 2013-02-07 ENCOUNTER — Encounter: Payer: Self-pay | Admitting: Physician Assistant

## 2013-02-07 ENCOUNTER — Ambulatory Visit (INDEPENDENT_AMBULATORY_CARE_PROVIDER_SITE_OTHER): Payer: BC Managed Care – PPO | Admitting: Physician Assistant

## 2013-02-07 VITALS — BP 135/89 | HR 90 | Wt 206.0 lb

## 2013-02-07 DIAGNOSIS — D649 Anemia, unspecified: Secondary | ICD-10-CM

## 2013-02-07 DIAGNOSIS — R7301 Impaired fasting glucose: Secondary | ICD-10-CM

## 2013-02-07 DIAGNOSIS — R143 Flatulence: Secondary | ICD-10-CM

## 2013-02-07 DIAGNOSIS — R195 Other fecal abnormalities: Secondary | ICD-10-CM

## 2013-02-07 DIAGNOSIS — G43909 Migraine, unspecified, not intractable, without status migrainosus: Secondary | ICD-10-CM

## 2013-02-07 DIAGNOSIS — E785 Hyperlipidemia, unspecified: Secondary | ICD-10-CM

## 2013-02-07 DIAGNOSIS — Z79899 Other long term (current) drug therapy: Secondary | ICD-10-CM

## 2013-02-07 DIAGNOSIS — R141 Gas pain: Secondary | ICD-10-CM

## 2013-02-07 MED ORDER — ONDANSETRON HCL 4 MG PO TABS: 4.0000 mg | ORAL_TABLET | Freq: Three times a day (TID) | ORAL | Status: DC | PRN

## 2013-02-07 MED ORDER — SUMATRIPTAN SUCCINATE 100 MG PO TABS: 100.0000 mg | ORAL_TABLET | ORAL | Status: DC | PRN

## 2013-02-07 MED ORDER — KETOROLAC TROMETHAMINE 60 MG/2ML IM SOLN
60.0000 mg | Freq: Once | INTRAMUSCULAR | Status: AC
  Administered 2013-02-07: 60 mg via INTRAMUSCULAR

## 2013-02-07 NOTE — Patient Instructions (Addendum)
Will initiate with hematologist for iron infusion.   Sent over zofran and imitrex. Keep a headache diary.    Consider Probiotics.

## 2013-02-07 NOTE — Progress Notes (Signed)
  Subjective:    Patient ID: Stacie Cardenas, female    DOB: 1981/03/07, 32 y.o.   MRN: [id number]  HPI Patient presents to the clinic with an ongoing migraine for the last 2 days and other concerns.   Pt has a current migraine that has lasted for 2 days. She has a long hx of migraines. She previously was on topamax. She has not had a problem and been well controlled with aleve/tylenol for past year or so. Migraines have been increasing in frequency recently. This is the 2nd HA this month that has lasted more than a day. HA are usually on left side and above eye. Denies any vision changes. She does get nauseated. Not aware of any sound or light sensitives. Not tried a tripan in a long time. Not aware of any triggers.    Pt has also been very gassy for the last 6 months. Her stools seem to be softer than usual and have an unusal foul odor.   Pt has dx iron deficiency anemia. She cannot tolerate oral iron due to GI side effects. She continues to feel fatigued, weak, and craving ice. She would like to see hematologist for possible infusion of IV iron.  2 days take tylenol and ibuprofen.   Pt has diagnosed hyperlipidemia in past medical history. Pt is currently on zocor for cholesterol. Pt tries to keep a healthy diet and exercise regularly.     Review of Systems     Objective:   Physical Exam  Constitutional: She is oriented to person, place, and time. She appears well-developed and well-nourished.  HENT:  Head: Normocephalic and atraumatic.  Cardiovascular: Normal rate, regular rhythm and normal heart sounds.   Pulmonary/Chest: Effort normal and breath sounds normal.  Abdominal: Soft. Bowel sounds are normal. She exhibits no distension. There is no tenderness.  Neurological: She is alert and oriented to person, place, and time.  Skin: Skin is warm and dry.  Psychiatric: She has a normal mood and affect. Her behavior is normal.          Assessment & Plan:  Migraine- Gave Toradol  injection 36m today IM. Given Imitrex to try at onset of headache. Keep HA diary. Zofran given for nausea. Follow up in 3 months. May need to consider other therapy or perhaps going back to prevenative.   IDA- order new labs to evaluate. Will refer to hematology for further evaluation since symptomatic and not able to tolerate oral iron. Discussed increasing iron rich foods in the meantime.    Hyperlipidemia- will recheck labs when fasting.   Impaired fasting glucose- Pt needs a serial recheck. CMP ordered today. Will call with results.   Gassy stools/odor- I suggested trying probiotics and seeing if that helps. If not consider keeping a food diary to help figure out foods that trigger. Gluten/dairy can be a common trigger.

## 2013-02-08 LAB — HEMOGLOBIN A1C: Hgb A1c MFr Bld: 6.1 % — ABNORMAL HIGH (ref <5.7)

## 2013-02-08 LAB — CBC
HCT: 33.5 % — ABNORMAL LOW (ref 36.0–46.0)
MCHC: 30.1 g/dL (ref 30.0–36.0)
MCV: 69.9 fL — ABNORMAL LOW (ref 78.0–100.0)
RDW: 17.3 % — ABNORMAL HIGH (ref 11.5–15.5)
WBC: 8.6 10*3/uL (ref 4.0–10.5)

## 2013-02-08 LAB — LIPID PANEL
LDL Cholesterol: 123 mg/dL — ABNORMAL HIGH (ref 0–99)
Total CHOL/HDL Ratio: 3.9 Ratio
VLDL: 18 mg/dL (ref 0–40)

## 2013-02-08 LAB — FERRITIN: Ferritin: 6 ng/mL — ABNORMAL LOW (ref 10–291)

## 2013-02-08 LAB — COMPLETE METABOLIC PANEL WITH GFR
AST: 20 U/L (ref 0–37)
Albumin: 3.8 g/dL (ref 3.5–5.2)
Alkaline Phosphatase: 81 U/L (ref 39–117)
Potassium: 4.2 mEq/L (ref 3.5–5.3)
Sodium: 138 mEq/L (ref 135–145)
Total Protein: 7.3 g/dL (ref 6.0–8.3)

## 2013-02-08 LAB — IRON AND TIBC: UIBC: 371 ug/dL (ref 125–400)

## 2013-02-12 ENCOUNTER — Other Ambulatory Visit: Payer: Self-pay | Admitting: Physician Assistant

## 2013-02-12 MED ORDER — SIMVASTATIN 40 MG PO TABS: 40.0000 mg | ORAL_TABLET | Freq: Every evening | ORAL | Status: DC

## 2013-03-17 ENCOUNTER — Encounter: Payer: Self-pay | Admitting: Hematology & Oncology

## 2013-03-17 ENCOUNTER — Ambulatory Visit (HOSPITAL_BASED_OUTPATIENT_CLINIC_OR_DEPARTMENT_OTHER): Payer: BC Managed Care – PPO | Admitting: Hematology & Oncology

## 2013-03-17 ENCOUNTER — Other Ambulatory Visit (HOSPITAL_BASED_OUTPATIENT_CLINIC_OR_DEPARTMENT_OTHER): Payer: BC Managed Care – PPO | Admitting: Lab

## 2013-03-17 ENCOUNTER — Ambulatory Visit: Payer: BC Managed Care – PPO

## 2013-03-17 VITALS — BP 142/86 | HR 80 | Temp 98.3°F | Resp 14 | Ht 64.0 in | Wt 208.0 lb

## 2013-03-17 DIAGNOSIS — D508 Other iron deficiency anemias: Secondary | ICD-10-CM | POA: Insufficient documentation

## 2013-03-17 DIAGNOSIS — D509 Iron deficiency anemia, unspecified: Secondary | ICD-10-CM

## 2013-03-17 HISTORY — DX: Other iron deficiency anemias: D50.8

## 2013-03-17 LAB — CBC WITH DIFFERENTIAL (CANCER CENTER ONLY)
BASO#: 0 10*3/uL (ref 0.0–0.2)
BASO%: 0.3 % (ref 0.0–2.0)
Eosinophils Absolute: 0.4 10*3/uL (ref 0.0–0.5)
HCT: 30.3 % — ABNORMAL LOW (ref 34.8–46.6)
HGB: 9.1 g/dL — ABNORMAL LOW (ref 11.6–15.9)
LYMPH#: 3.2 10*3/uL (ref 0.9–3.3)
MCH: 21 pg — ABNORMAL LOW (ref 26.0–34.0)
MONO#: 0.5 10*3/uL (ref 0.1–0.9)
MONO%: 6.5 % (ref 0.0–13.0)
NEUT%: 44.9 % (ref 39.6–80.0)
Platelets: 293 10*3/uL (ref 145–400)
RBC: 4.34 10*6/uL (ref 3.70–5.32)
RDW: 17 % — ABNORMAL HIGH (ref 11.1–15.7)
WBC: 7.4 10*3/uL (ref 3.9–10.0)

## 2013-03-17 LAB — IRON AND TIBC CHCC
Iron: 12 ug/dL — ABNORMAL LOW (ref 41–142)
UIBC: 350 ug/dL (ref 120–384)

## 2013-03-17 LAB — CHCC SATELLITE - SMEAR

## 2013-03-17 NOTE — Progress Notes (Signed)
This office note has been dictated.

## 2013-03-18 ENCOUNTER — Ambulatory Visit (HOSPITAL_BASED_OUTPATIENT_CLINIC_OR_DEPARTMENT_OTHER): Payer: BC Managed Care – PPO

## 2013-03-18 VITALS — BP 136/90 | HR 88 | Temp 98.7°F | Resp 18

## 2013-03-18 DIAGNOSIS — D508 Other iron deficiency anemias: Secondary | ICD-10-CM

## 2013-03-18 MED ORDER — SODIUM CHLORIDE 0.9 % IV SOLN
Freq: Once | INTRAVENOUS | Status: AC
  Administered 2013-03-18: 10:00:00 via INTRAVENOUS

## 2013-03-18 MED ORDER — ACETAMINOPHEN 325 MG PO TABS
ORAL_TABLET | ORAL | Status: AC
  Filled 2013-03-18: qty 2

## 2013-03-18 MED ORDER — SODIUM CHLORIDE 0.9 % IV SOLN
1020.0000 mg | Freq: Once | INTRAVENOUS | Status: AC
  Administered 2013-03-18: 1020 mg via INTRAVENOUS
  Filled 2013-03-18: qty 34

## 2013-03-18 MED ORDER — ACETAMINOPHEN 325 MG PO TABS
650.0000 mg | ORAL_TABLET | Freq: Once | ORAL | Status: AC
  Administered 2013-03-18: 650 mg via ORAL

## 2013-03-18 NOTE — Progress Notes (Signed)
CC:   Stacie Planas, PA-C  DIAGNOSIS:  Microcytic anemia -- likely iron deficiency.  HISTORY OF PRESENT ILLNESS:  Stacie Cardenas is a very charming 32 year old African American female.  She lives in [place].  She is followed by Primary Care in [place].  She sees [employer], PA-C there.  Stacie Cardenas has felt tired.  She has had problems with anemia for awhile. She has had heavy cycles.  She has had no obvious bleeding outside of the cycles.  She had some lab work done recently.  Back on 09/02 CBC was done which showed a white cell count 8.6, hemoglobin 10.1, hematocrit 33.6, platelet count 321.  MCV was 70.  Iron studies showed ferritin that was quite low at 6.  Her iron and TIBC were 26 and 371.  Her iron saturation was only 7%.  She has tried oral iron.  She  says she cannot take this because it causes a lot of abdominal complaints.  She just feels tired.  She has had migraines.  She has had no fever. There is no weight loss or weight gain.  She is not a vegetarian.  She has 3 kids.  She works.  She goes to school.  She is very busy.  It is becoming hard for her to do her activities because of the fatigue. Again, she was kindly referred to the [facility] for an evaluation.  Overall, her performance status is ECOG 1.  She has not noted any melena or bright-red blood per rectum.  There is no hemoptysis or hematemesis.  There is no epistaxis.  She has had no abdominal pain.  She has had no cough.  There is some shortness of breath with exertion.  PAST MEDICAL HISTORY: 1. Hyperlipidemia. 2. Hypertension. 3. Migraines. 4. Constipation. 5. GERD. 6. Allergic rhinitis.  ALLERGIES:  None.  MEDICATIONS:  Flonase nasal spray 2 sprays daily, Zofran 4 mg every 8 hours p.r.n.  MiraLAX 17g p.o. daily, Zocor 40 mg p.o. daily, Imitrex 100 mg as needed, Micardis, hydrochlorothiazide (80/12.5) 1 p.o. daily.  SOCIAL HISTORY:  Negative for tobacco use.  There  is really no alcohol use.  She has no obvious occupational exposures.  FAMILY HISTORY:  Remarkable for a half-brother with sickle cell trait. She has 3 kids who do not have sickle cell trait.  There is no cancer in the family.  REVIEW OF SYSTEMS:  As stated in the history of present illness.  No additional findings noted on 12-system review.  PHYSICAL EXAMINATION:  General:  This is a well-developed, well- nourished African American female in no obvious distress.  Vital signs: Temperature of 98.3, pulse 80, respiratory rate 14, blood pressure 142/86.  Weight is 208 pounds.  Head and neck:  Normocephalic, atraumatic skull.  There are no ocular or oral lesions.  There are no palpable cervical or supraclavicular lymph nodes.  Lungs:  Clear to percussion and auscultation bilaterally.  Cardiac:  Regular rate and rhythm with a normal S1 and S2.  There are no murmurs, rubs or bruits. Abdomen:  Soft.  She has good bowel sounds.  She has no fluid wave. There is no palpable abdominal mass.  She has laparotomy scar from the cesarean sections.  There is no palpable hepatosplenomegaly.  Back: No tenderness over the spine, ribs, or hips.  Extremities:  Show no clubbing, cyanosis or edema.  She has no joint issues.  She has good strength in her muscles.  Skin:  No rashes, ecchymosis, or petechia.  LABORATORY STUDIES:  White cell count is 7.4, hemoglobin 9.1, hematocrit 30.3, platelet count 293.  MCV is 70.  Ferritin is 9 with an iron saturation of 3%.  Her peripheral smear shows a microcytic population of red blood cells. There is some mild anisocytosis and poikilocytosis.  I do not see any teardrop cells.  There were no ovalocytes or elliptocytes.  There were no nucleated red blood cells.  I saw no target cells.  There were no schistocytes or spherocytes.  She had no rouleaux formation.  White cells appear normal in morphology and maturation.  There was no immature myeloid or lymphoid forms.   There was no hypersegmented polys.  There was no atypical lymphocytes.  There was no blasts.  Platelets are adequate in number and size.  She has well-granulated platelets.  IMPRESSION:  Stacie Cardenas is a very charming 32 year old African female. She is clearly iron-deficient.  Her iron studies are very low.  She has a blood smear that looks like iron deficiency.  She has heavy cycles.  We will go ahead and plan to give her IV iron.  I suspect that she may need a couple doses of Feraheme.  We will see about getting 2 doses set up for her separated by 1 week.  I will plan to get her back to see me in another 6 weeks or so.  By then, we should be seeing her feeling a lot better with her indices, improving.  I do not see a need for a bone marrow biopsy.  I do not see a need for any additional workup.  I do not think she needs any kind of bleeding workup for the heavy menstrual cycles as there are no kind of bleeding problems in the family.  I spent a good hour with Stacie Cardenas and her husband.  They are both very, very nice.  We will get her set up with her 1st dose of iron this week and then give her a second dose next week.    ______________________________ Stacie Cardenas, M.D. PRE/MEDQ  D:  03/17/2013  T:  03/18/2013  Job:  [id number]

## 2013-03-18 NOTE — Patient Instructions (Signed)
Ferumoxytol injection What is this medicine? FERUMOXYTOL is an iron complex. Iron is used to make healthy red blood cells, which carry oxygen and nutrients throughout the body. This medicine is used to treat iron deficiency anemia in people with chronic kidney disease. This medicine may be used for other purposes; ask your health care provider or pharmacist if you have questions. What should I tell my health care provider before I take this medicine? They need to know if you have any of these conditions: -anemia not caused by low iron levels -high levels of iron in the blood -magnetic resonance imaging (MRI) test scheduled -an unusual or allergic reaction to iron, other medicines, foods, dyes, or preservatives -pregnant or trying to get pregnant -breast-feeding How should I use this medicine? This medicine is for infusion into a vein. It is given by a health care professional in a hospital or clinic setting. Talk to your pediatrician regarding the use of this medicine in children. Special care may be needed. Overdosage: If you think you've taken too much of this medicine contact a poison control center or emergency room at once. Overdosage: If you think you have taken too much of this medicine contact a poison control center or emergency room at once. NOTE: This medicine is only for you. Do not share this medicine with others. What if I miss a dose? It is important not to miss your dose. Call your doctor or health care professional if you are unable to keep an appointment. What may interact with this medicine? This medicine may interact with the following medications: -other iron products This list may not describe all possible interactions. Give your health care provider a list of all the medicines, herbs, non-prescription drugs, or dietary supplements you use. Also tell them if you smoke, drink alcohol, or use illegal drugs. Some items may interact with your medicine. What should I watch  for while using this medicine? Visit your doctor or healthcare professional regularly. Tell your doctor or healthcare professional if your symptoms do not start to get better or if they get worse. You may need blood work done while you are taking this medicine. You may need to follow a special diet. Talk to your doctor. Foods that contain iron include: whole grains/cereals, dried fruits, beans, or peas, leafy green vegetables, and organ meats (liver, kidney). What side effects may I notice from receiving this medicine? Side effects that you should report to your doctor or health care professional as soon as possible: -allergic reactions like skin rash, itching or hives, swelling of the face, lips, or tongue -breathing problems -changes in blood pressure -feeling faint or lightheaded, falls -fever or chills -flushing, sweating, or hot feelings -swelling of the ankles or feet Side effects that usually do not require medical attention (Report these to your doctor or health care professional if they continue or are bothersome.): -diarrhea -headache -nausea, vomiting -stomach pain This list may not describe all possible side effects. Call your doctor for medical advice about side effects. You may report side effects to FDA at 1-800-FDA-1088. Where should I keep my medicine? This drug is given in a hospital or clinic and will not be stored at home. NOTE: This sheet is a summary. It may not cover all possible information. If you have questions about this medicine, talk to your doctor, pharmacist, or health care provider.  2012, [employer]. (02/19/2008 9:48:25 PM) 

## 2013-03-19 LAB — RETICULOCYTES (CHCC)
RBC.: 4.47 MIL/uL (ref 3.87–5.11)
Retic Ct Pct: 1.6 % (ref 0.4–2.3)

## 2013-03-19 LAB — HEMOGLOBINOPATHY EVALUATION
Hgb A: 98 % — ABNORMAL HIGH (ref 96.8–97.8)
Hgb F Quant: 0 % (ref 0.0–2.0)
Hgb S Quant: 0 %

## 2013-03-25 ENCOUNTER — Ambulatory Visit (HOSPITAL_BASED_OUTPATIENT_CLINIC_OR_DEPARTMENT_OTHER): Payer: BC Managed Care – PPO

## 2013-03-25 VITALS — BP 144/94 | HR 91 | Temp 98.1°F | Resp 20

## 2013-03-25 DIAGNOSIS — D508 Other iron deficiency anemias: Secondary | ICD-10-CM

## 2013-03-25 MED ORDER — SODIUM CHLORIDE 0.9 % IV SOLN
1020.0000 mg | Freq: Once | INTRAVENOUS | Status: AC
  Administered 2013-03-25: 1020 mg via INTRAVENOUS
  Filled 2013-03-25: qty 34

## 2013-03-25 MED ORDER — ACETAMINOPHEN 325 MG PO TABS
ORAL_TABLET | ORAL | Status: AC
  Filled 2013-03-25: qty 2

## 2013-03-25 MED ORDER — ACETAMINOPHEN 325 MG PO TABS
650.0000 mg | ORAL_TABLET | Freq: Once | ORAL | Status: AC
  Administered 2013-03-25: 650 mg via ORAL

## 2013-03-25 NOTE — Patient Instructions (Signed)
Ferumoxytol injection What is this medicine? FERUMOXYTOL is an iron complex. Iron is used to make healthy red blood cells, which carry oxygen and nutrients throughout the body. This medicine is used to treat iron deficiency anemia in people with chronic kidney disease. This medicine may be used for other purposes; ask your health care provider or pharmacist if you have questions. What should I tell my health care provider before I take this medicine? They need to know if you have any of these conditions: -anemia not caused by low iron levels -high levels of iron in the blood -magnetic resonance imaging (MRI) test scheduled -an unusual or allergic reaction to iron, other medicines, foods, dyes, or preservatives -pregnant or trying to get pregnant -breast-feeding How should I use this medicine? This medicine is for infusion into a vein. It is given by a health care professional in a hospital or clinic setting. Talk to your pediatrician regarding the use of this medicine in children. Special care may be needed. Overdosage: If you think you've taken too much of this medicine contact a poison control center or emergency room at once. Overdosage: If you think you have taken too much of this medicine contact a poison control center or emergency room at once. NOTE: This medicine is only for you. Do not share this medicine with others. What if I miss a dose? It is important not to miss your dose. Call your doctor or health care professional if you are unable to keep an appointment. What may interact with this medicine? This medicine may interact with the following medications: -other iron products This list may not describe all possible interactions. Give your health care provider a list of all the medicines, herbs, non-prescription drugs, or dietary supplements you use. Also tell them if you smoke, drink alcohol, or use illegal drugs. Some items may interact with your medicine. What should I watch  for while using this medicine? Visit your doctor or healthcare professional regularly. Tell your doctor or healthcare professional if your symptoms do not start to get better or if they get worse. You may need blood work done while you are taking this medicine. You may need to follow a special diet. Talk to your doctor. Foods that contain iron include: whole grains/cereals, dried fruits, beans, or peas, leafy green vegetables, and organ meats (liver, kidney). What side effects may I notice from receiving this medicine? Side effects that you should report to your doctor or health care professional as soon as possible: -allergic reactions like skin rash, itching or hives, swelling of the face, lips, or tongue -breathing problems -changes in blood pressure -feeling faint or lightheaded, falls -fever or chills -flushing, sweating, or hot feelings -swelling of the ankles or feet Side effects that usually do not require medical attention (Report these to your doctor or health care professional if they continue or are bothersome.): -diarrhea -headache -nausea, vomiting -stomach pain This list may not describe all possible side effects. Call your doctor for medical advice about side effects. You may report side effects to FDA at 1-800-FDA-1088. Where should I keep my medicine? This drug is given in a hospital or clinic and will not be stored at home. NOTE: This sheet is a summary. It may not cover all possible information. If you have questions about this medicine, talk to your doctor, pharmacist, or health care provider.  2012, [employer]. (02/19/2008 9:48:25 PM) 

## 2013-03-26 ENCOUNTER — Telehealth: Payer: Self-pay | Admitting: Hematology & Oncology

## 2013-03-26 NOTE — Telephone Encounter (Signed)
Pt requested FMLA to be mailed to her home today.

## 2013-04-07 ENCOUNTER — Encounter: Payer: Self-pay | Admitting: Nurse Practitioner

## 2013-04-07 NOTE — Progress Notes (Signed)
Pt called and stated "she was continuing to feel tired and have low tolerance for any types of activity." Per Dr. Marin Olp, pt encouraged to take rest breaks as needed, the past week is the first time of receiving IV iron and it may take some time for her to see the benefits of the infusion. Pt verbalized understanding and stated, "she is feeling somewhat better than she did last week."

## 2013-04-23 ENCOUNTER — Encounter: Payer: Self-pay | Admitting: Physician Assistant

## 2013-04-23 ENCOUNTER — Ambulatory Visit (INDEPENDENT_AMBULATORY_CARE_PROVIDER_SITE_OTHER): Payer: BC Managed Care – PPO | Admitting: Physician Assistant

## 2013-04-23 VITALS — BP 131/87 | HR 98 | Wt 208.0 lb

## 2013-04-23 DIAGNOSIS — L0291 Cutaneous abscess, unspecified: Secondary | ICD-10-CM

## 2013-04-23 MED ORDER — FLUCONAZOLE 150 MG PO TABS: 150.0000 mg | ORAL_TABLET | Freq: Every day | ORAL | Status: DC

## 2013-04-23 MED ORDER — CEPHALEXIN 500 MG PO CAPS: 500.0000 mg | ORAL_CAPSULE | Freq: Two times a day (BID) | ORAL | Status: DC

## 2013-04-23 NOTE — Progress Notes (Signed)
  Subjective:    Patient ID: Stacie Cardenas, female    DOB: [birth year], 32 y.o.   MRN: [id number]  HPI Patient is a 32 year old female who presents to the clinic with an abscess on her right breast. Patient has a history of abscess that have needed to be drained. She has notice this abscess since Saturday approximately 5 days ago. It has continued to get bigger and is becoming more warm to the touch. There is some pain associated where abscess is located She has never tested positive for MRSA. [name] tried warm compresses none of which bring to a head. She denies any fever, chills, nausea or vomiting.  Review of Systems     Objective:   Physical Exam  Skin:  1cm by 1cm boil closed with no draining. Surrounding induration extended another 1 1/2 cm around circumference.          Assessment & Plan:  Abscess and cellulitis-I&D was performed today. Patient was started on Keflex since there was some surrounding cellulitis. Wound culture was obtained today. Discussed with patient how to keep wound clean. Placed gauze with bandaid over opening today. Follow up if not improving.   Incision and Drainage Procedure Note  Pre-operative Diagnosis: abscess  Post-operative Diagnosis: same  Indications: infected/painful  Anesthesia: 1% plain lidocaine  Procedure Details  The procedure, risks and complications have been discussed in detail (including, but not limited to airway compromise, infection, bleeding) with the patient, and the patient has signed consent to the procedure.  The skin was sterilely prepped and draped over the affected area in the usual fashion. After adequate local anesthesia, I&D with a #11 blade was performed on the right breast. Purulent drainage: present The patient was observed until stable.  Findings: Purulent discharge  EBL: 1 cc  Drains: none  Condition: Tolerated procedure well   Complications: pain.

## 2013-04-27 LAB — WOUND CULTURE

## 2013-04-28 ENCOUNTER — Ambulatory Visit (HOSPITAL_BASED_OUTPATIENT_CLINIC_OR_DEPARTMENT_OTHER): Payer: BC Managed Care – PPO | Admitting: Hematology & Oncology

## 2013-04-28 ENCOUNTER — Telehealth: Payer: Self-pay | Admitting: Hematology & Oncology

## 2013-04-28 ENCOUNTER — Other Ambulatory Visit (HOSPITAL_BASED_OUTPATIENT_CLINIC_OR_DEPARTMENT_OTHER): Payer: BC Managed Care – PPO | Admitting: Lab

## 2013-04-28 VITALS — BP 146/90 | HR 75 | Temp 98.3°F | Resp 14 | Ht 64.0 in | Wt 208.0 lb

## 2013-04-28 DIAGNOSIS — D508 Other iron deficiency anemias: Secondary | ICD-10-CM

## 2013-04-28 DIAGNOSIS — D509 Iron deficiency anemia, unspecified: Secondary | ICD-10-CM

## 2013-04-28 LAB — CBC WITH DIFFERENTIAL (CANCER CENTER ONLY)
BASO%: 0.1 % (ref 0.0–2.0)
EOS%: 2.2 % (ref 0.0–7.0)
HCT: 40.1 % (ref 34.8–46.6)
LYMPH%: 34.6 % (ref 14.0–48.0)
MCHC: 32.2 g/dL (ref 32.0–36.0)
MCV: 79 fL — ABNORMAL LOW (ref 81–101)
NEUT#: 4.7 10*3/uL (ref 1.5–6.5)
NEUT%: 56.3 % (ref 39.6–80.0)
Platelets: 217 10*3/uL (ref 145–400)
RBC: 5.06 10*6/uL (ref 3.70–5.32)
RDW: UNDETERMINED % (ref 11.1–15.7)

## 2013-04-28 LAB — IRON AND TIBC CHCC: %SAT: 47 % (ref 21–57)

## 2013-04-28 LAB — CHCC SATELLITE - SMEAR

## 2013-04-28 LAB — RETICULOCYTES (CHCC)
ABS Retic: 71.7 10*3/uL (ref 19.0–186.0)
RBC.: 5.12 MIL/uL — ABNORMAL HIGH (ref 3.87–5.11)

## 2013-04-28 NOTE — Progress Notes (Signed)
This office note has been dictated.

## 2013-04-28 NOTE — Telephone Encounter (Signed)
Pt in ofc today to advise, she has lost her job and her medical insurance will expire/term on 05/11/2013.  I gave pt a [employer] ([employer]) and Medicaid application to complete and return.

## 2013-04-28 NOTE — Telephone Encounter (Signed)
Mailed January schedule

## 2013-04-28 NOTE — Telephone Encounter (Signed)
Pt has completed CAFA and Medicaid applications and  they will be forwarded to: [facility] ([place]) and [place] today for processing.   [facility], [place], [place] [address] [phone]

## 2013-04-29 ENCOUNTER — Telehealth: Payer: Self-pay | Admitting: Nurse Practitioner

## 2013-04-29 NOTE — Telephone Encounter (Addendum)
Message copied by Jimmy Footman on Tue Apr 29, 2013 10:14 AM ------      Message from: Burney Gauze R      Created: Mon Apr 28, 2013  3:22 PM       Call - iron is fantastic!!!!  Not even close to being low!!  Laurey Arrow ------Pt verbalized understanding and appreciation.

## 2013-05-03 NOTE — Progress Notes (Signed)
CC:   [name], PA-C  DIAGNOSIS:  Iron-deficiency anemia.  CURRENT THERAPY:  Status post IV iron with Feraheme.  INTERIM HISTORY:  Ms. Stacie Cardenas comes in for a second office visit.  We first saw her back in early October.  At that point, her ferritin was 9 with an iron saturation of 3%.  We went ahead and gave her 2 doses of Feraheme at 1020 mg each.  She tolerated this very well.  She still feels a little tired.  She is not chewing ice anymore.  She still has her cycles, which are somewhat heavy.  She has had no bleeding otherwise.  Of note, I did do a hemoglobin electrophoresis on her when we first saw her.  She had a normal hemoglobin electrophoresis without any sickle cell or thalassemia.  She has had no problems with cough or shortness of breath.  She has had no rashes.  There has been no weight loss or weight gain.  PHYSICAL EXAM:  General:  This is a well-developed, well-nourished African American female in no obvious distress.  Vital Signs:  Show a temperature of 98.3, pulse 75, respiratory rate 14, blood pressure 146/90, weight is 208 pounds.  Head and Neck:  Exam shows a normocephalic and atraumatic skull.  There are no ocular or oral lesions.  There are no palpable cervical or supraclavicular lymph nodes. Lungs:  Clear bilaterally.  Cardiac:  Regular rate and rhythm with a normal S1 and S2.  There are no murmurs, rubs, or bruits.  Abdomen: Soft.  She has good bowel sounds.  There is no fluid wave.  There is no palpable abdominal mass.  There is no palpable hepatosplenomegaly. Back:  No tenderness over the spine, ribs, or hips.  Extremities:  Show no clubbing, cyanosis, or edema.  LABORATORY STUDIES:  White cell count is 8.3, hemoglobin 12.3, hematocrit 40.1, platelet count 217.  MCV is 79.  IMPRESSION:  Stacie Cardenas is a very charming 32 year old African American female.  She has iron-deficiency anemia.  She does have heavy monthly cycles.  I do not see any source  of iron loss otherwise.  I did look at her blood smear.  The red cells are not as microcytic.  There is still some slight anisocytosis.  Her white cells appear normal.  It shows normal platelets.  Again, we will see what her iron studies look like.  I would think after 2 doses of Feraheme, her iron stores should be okay.  I want to try to get her back in 2 months' time now.  I suspect that as long as she has her heavy cycles, we likely will need to give her another dose of iron.  I went over Ms. Marzan's lab work with her.  I let her know that her blood smear looked better.    ______________________________ Volanda Napoleon, M.D. PRE/MEDQ  D:  04/28/2013  T:  05/03/2013  Job:  [id number]

## 2013-06-23 ENCOUNTER — Encounter: Payer: Self-pay | Admitting: Hematology & Oncology

## 2013-06-23 ENCOUNTER — Ambulatory Visit (HOSPITAL_BASED_OUTPATIENT_CLINIC_OR_DEPARTMENT_OTHER): Payer: BC Managed Care – PPO | Admitting: Hematology & Oncology

## 2013-06-23 ENCOUNTER — Other Ambulatory Visit (HOSPITAL_BASED_OUTPATIENT_CLINIC_OR_DEPARTMENT_OTHER): Payer: Self-pay | Admitting: Lab

## 2013-06-23 VITALS — BP 146/91 | HR 76 | Temp 98.7°F | Resp 14 | Ht 64.0 in | Wt 209.0 lb

## 2013-06-23 DIAGNOSIS — D509 Iron deficiency anemia, unspecified: Secondary | ICD-10-CM

## 2013-06-23 DIAGNOSIS — D508 Other iron deficiency anemias: Secondary | ICD-10-CM

## 2013-06-23 LAB — CBC WITH DIFFERENTIAL (CANCER CENTER ONLY)
BASO#: 0 10*3/uL (ref 0.0–0.2)
BASO%: 0.3 % (ref 0.0–2.0)
EOS%: 2.9 % (ref 0.0–7.0)
Eosinophils Absolute: 0.2 10*3/uL (ref 0.0–0.5)
HEMATOCRIT: 41.8 % (ref 34.8–46.6)
HGB: 13.7 g/dL (ref 11.6–15.9)
LYMPH#: 2.7 10*3/uL (ref 0.9–3.3)
LYMPH%: 37.3 % (ref 14.0–48.0)
MCH: 28.1 pg (ref 26.0–34.0)
MCHC: 32.8 g/dL (ref 32.0–36.0)
MCV: 86 fL (ref 81–101)
MONO#: 0.5 10*3/uL (ref 0.1–0.9)
MONO%: 6.2 % (ref 0.0–13.0)
NEUT#: 3.9 10*3/uL (ref 1.5–6.5)
NEUT%: 53.3 % (ref 39.6–80.0)
Platelets: 212 10*3/uL (ref 145–400)
RBC: 4.87 10*6/uL (ref 3.70–5.32)
RDW: 16 % — ABNORMAL HIGH (ref 11.1–15.7)
WBC: 7.2 10*3/uL (ref 3.9–10.0)

## 2013-06-23 LAB — FERRITIN CHCC: Ferritin: 252 ng/ml (ref 9–269)

## 2013-06-23 LAB — IRON AND TIBC CHCC
%SAT: 45 % (ref 21–57)
Iron: 95 ug/dL (ref 41–142)
TIBC: 210 ug/dL — ABNORMAL LOW (ref 236–444)
UIBC: 115 ug/dL — ABNORMAL LOW (ref 120–384)

## 2013-06-23 LAB — CHCC SATELLITE - SMEAR

## 2013-06-23 NOTE — Progress Notes (Signed)
This office note has been dictated.

## 2013-06-24 NOTE — Progress Notes (Signed)
CC:   [name], PA-C  DIAGNOSES: 1. Iron-deficiency anemia. 2. Menometrorrhagia.  CURRENT THERAPY:  Status post IV iron.  INTERIM HISTORY:  Ms. Pajak comes in for a followup.  She is feeling better.  The iron has worked very nicely for her.  She actually got 2 doses of Feraheme at 1020 mg.  the last dose was given on October 14th.  Iron studies have come up quite nicely.  When we first saw her, her ferritin was I think 3 with an iron saturation of 7%.  With the IV iron that we gave her, she went up to apparent of 417 with an iron saturation of 47% in November.  She is still having some heavy cycles.  She started her cycle today.  She does not have any type of hemoglobinopathy.  We did check her for sickle cell thalassemia.  She has had no bleeding otherwise.  She is still going to school.  She does feel a bit more energetic.  She enjoys the holidays.  PHYSICAL EXAMINATION:  General:  This is a mildly obese African American female in no obvious distress.  Vital Signs:  Temperature of 98.7, pulse 76, respiratory rate 14, blood pressure 146/91, weight is 209 pounds. Head and Neck:  Normocephalic, atraumatic skull.  There are no ocular or oral lesions.  There are no palpable cervical or supraclavicular lymph nodes.  Lungs:  Clear to percussion and auscultation bilaterally. Cardiac:  Regular rate and rhythm with normal S1 and S2.  She has no murmurs, rubs, or bruits.  Abdomen:  Soft.  She has good bowel sounds. There is no fluid wave.  There is no palpable abdominal mass.  There is no palpable hepatosplenomegaly.  Back:  No tenderness over the spine, ribs, or hips.  Extremities:  Show no clubbing, cyanosis, or edema. Neurological:  Shows no focal neurological deficit.  LABORATORY STUDIES:  White cell count is 7.2, hemoglobin 13.7, hematocrit 41.8, platelet count 212.  MCV is 86.  IMPRESSION:  Ms. Chamblee is a very charming 33 year old African American female.  She has  iron-deficiency anemia.  She has menometrorrhagia.  We have corrected her iron deficiency right now.  Her MCV is highly indicative of this.  We will go ahead and plan to get back her in 3 months now.  I think this should be good amount of time for followup.    ______________________________ Volanda Napoleon, M.D. PRE/MEDQ  D:  06/23/2013  T:  06/24/2013  Job:  [id number]

## 2013-06-27 ENCOUNTER — Telehealth: Payer: Self-pay | Admitting: Nurse Practitioner

## 2013-06-27 NOTE — Telephone Encounter (Addendum)
Message copied by Jimmy Footman on Fri Jun 27, 2013  4:21 PM ------      Message from: Burney Gauze R      Created: Mon Jun 23, 2013  6:57 PM       Call - iron is ok!!  Laurey Arrow ------Pt verbalized understanding and appreciation.

## 2013-07-03 ENCOUNTER — Encounter: Payer: Self-pay | Admitting: Physician Assistant

## 2013-07-03 DIAGNOSIS — N920 Excessive and frequent menstruation with regular cycle: Secondary | ICD-10-CM | POA: Insufficient documentation

## 2013-09-19 ENCOUNTER — Telehealth: Payer: Self-pay | Admitting: Hematology & Oncology

## 2013-09-19 NOTE — Telephone Encounter (Signed)
Patient called and cx 09/22/13 apt and resch for 09/24/13

## 2013-09-22 ENCOUNTER — Other Ambulatory Visit: Payer: BC Managed Care – PPO | Admitting: Lab

## 2013-09-22 ENCOUNTER — Ambulatory Visit: Payer: BC Managed Care – PPO | Admitting: Hematology & Oncology

## 2013-09-24 ENCOUNTER — Telehealth: Payer: Self-pay | Admitting: Hematology & Oncology

## 2013-09-24 ENCOUNTER — Ambulatory Visit (HOSPITAL_BASED_OUTPATIENT_CLINIC_OR_DEPARTMENT_OTHER): Payer: BC Managed Care – PPO | Admitting: Hematology & Oncology

## 2013-09-24 ENCOUNTER — Encounter: Payer: Self-pay | Admitting: Hematology & Oncology

## 2013-09-24 ENCOUNTER — Other Ambulatory Visit (HOSPITAL_BASED_OUTPATIENT_CLINIC_OR_DEPARTMENT_OTHER): Payer: BC Managed Care – PPO | Admitting: Lab

## 2013-09-24 VITALS — BP 143/96 | HR 71 | Temp 98.0°F | Resp 14 | Ht 64.0 in | Wt 213.0 lb

## 2013-09-24 DIAGNOSIS — D500 Iron deficiency anemia secondary to blood loss (chronic): Secondary | ICD-10-CM

## 2013-09-24 DIAGNOSIS — D508 Other iron deficiency anemias: Secondary | ICD-10-CM

## 2013-09-24 DIAGNOSIS — R635 Abnormal weight gain: Secondary | ICD-10-CM

## 2013-09-24 DIAGNOSIS — N920 Excessive and frequent menstruation with regular cycle: Secondary | ICD-10-CM

## 2013-09-24 LAB — IRON AND TIBC CHCC
%SAT: 24 % (ref 21–57)
Iron: 54 ug/dL (ref 41–142)
TIBC: 221 ug/dL — AB (ref 236–444)
UIBC: 167 ug/dL (ref 120–384)

## 2013-09-24 LAB — CBC WITH DIFFERENTIAL (CANCER CENTER ONLY)
BASO#: 0 10*3/uL (ref 0.0–0.2)
BASO%: 0.3 % (ref 0.0–2.0)
EOS%: 3.6 % (ref 0.0–7.0)
Eosinophils Absolute: 0.2 10*3/uL (ref 0.0–0.5)
HCT: 39.3 % (ref 34.8–46.6)
HEMOGLOBIN: 13 g/dL (ref 11.6–15.9)
LYMPH#: 2.6 10*3/uL (ref 0.9–3.3)
LYMPH%: 43.3 % (ref 14.0–48.0)
MCH: 29.3 pg (ref 26.0–34.0)
MCHC: 33.1 g/dL (ref 32.0–36.0)
MCV: 89 fL (ref 81–101)
MONO#: 0.5 10*3/uL (ref 0.1–0.9)
MONO%: 7.5 % (ref 0.0–13.0)
NEUT%: 45.3 % (ref 39.6–80.0)
NEUTROS ABS: 2.7 10*3/uL (ref 1.5–6.5)
Platelets: 228 10*3/uL (ref 145–400)
RBC: 4.43 10*6/uL (ref 3.70–5.32)
RDW: 12.5 % (ref 11.1–15.7)
WBC: 6 10*3/uL (ref 3.9–10.0)

## 2013-09-24 LAB — CHCC SATELLITE - SMEAR

## 2013-09-24 LAB — TSH CHCC: TSH: 0.936 m[IU]/L (ref 0.308–3.960)

## 2013-09-24 LAB — RETICULOCYTES (CHCC)
ABS RETIC: 75.7 10*3/uL (ref 19.0–186.0)
RBC.: 4.45 MIL/uL (ref 3.87–5.11)
RETIC CT PCT: 1.7 % (ref 0.4–2.3)

## 2013-09-24 LAB — FERRITIN CHCC: Ferritin: 118 ng/ml (ref 9–269)

## 2013-09-24 NOTE — Telephone Encounter (Signed)
Pt just retrnd [place] and Financial Assistance ([place]) application today.  Pt does not have any insurance and not working at this time.  Application being forwarded to [facility] via inter ofc mail today.

## 2013-09-24 NOTE — Progress Notes (Signed)
Hematology and Oncology Follow Up Visit  Stacie Cardenas [id number] [birth year] 33 y.o. 09/24/2013   Principle Diagnosis:   Iron deficiency anemia  Menometrorrhagia  Current Therapy:    IV iron as indicated-last dose back in November 2014     Interim History:  Ms.  Cardenas is back for followup. She still feels tired. She still haven't every monthly cycles.  Will last saw her in January, her iron studies showed a ferritin of 252. Her iron saturation was 45%.  Last cycle was about 3 weeks ago. It was a pretty heavy and lasted 7 days.  [name] had no bleeding otherwise. There have she's gained weight. We will check a TSH on her make sure there is no issues with her thyroid.  [name] had no cough. No shortness of breath. There's no nausea or vomiting. Medications: Current outpatient prescriptions:Adapalene-Benzoyl Peroxide 0.1-2.5 % gel, Apply 1 application topically every morning., Disp: , Rfl: ;  cetirizine-pseudoephedrine (ZYRTEC-D) 5-120 MG per tablet, Take 1 tablet by mouth as needed., Disp: , Rfl: ;  fluticasone (FLONASE) 50 MCG/ACT nasal spray, Place 2 sprays into the nose as needed., Disp: , Rfl:  simvastatin (ZOCOR) 40 MG tablet, Take 1 tablet (40 mg total) by mouth every evening., Disp: 30 tablet, Rfl: 11;  SUMAtriptan (IMITREX) 100 MG tablet, Take 100 mg by mouth as needed for migraine., Disp: , Rfl: ;  telmisartan-hydrochlorothiazide (MICARDIS HCT) 80-12.5 MG per tablet, Take 1 tablet by mouth daily., Disp: 30 tablet, Rfl: 6;  ondansetron (ZOFRAN) 4 MG tablet, Take 4 mg by mouth as needed for nausea., Disp: , Rfl:  polyethylene glycol powder (GLYCOLAX/MIRALAX) powder, Take 17 g by mouth as needed. To prevent constipation., Disp: , Rfl:   Allergies: No Known Allergies  Past Medical History, Surgical history, Social history, and Family History were reviewed and updated.  Review of Systems: As above  Physical Exam:  height is 5' 4"  (1.626 m) and weight is 213 lb (96.616 kg). Her oral  temperature is 98 F (36.7 C). Her blood pressure is 143/96 and her pulse is 71. Her respiration is 14.   Well-developed and well-nourished African American female. Head and neck exam shows no sclera icterus. She has no oral lesions. There is no adenopathy in the neck. Thyroid is not bubble. Lungs are clear. Cardiac exam regular rate and rhythm. Abdomen is soft. She is obese. There is no fluid wave. There is no palpable liver or spleen tip. Back exam no tenderness over the spine ribs or hips. Extremities shows no clubbing cyanosis or edema. Skin exam is somewhat dry. Neurological exam shows slightly decreased reflexes.  Lab Results  Component Value Date   WBC 6.0 09/24/2013   HGB 13.0 09/24/2013   HCT 39.3 09/24/2013   MCV 89 09/24/2013   PLT 228 09/24/2013     Chemistry      Component Value Date/Time   NA 138 02/07/2013 1149   K 4.2 02/07/2013 1149   CL 104 02/07/2013 1149   CO2 28 02/07/2013 1149   BUN 8 02/07/2013 1149   CREATININE 0.70 02/07/2013 1149   CREATININE 0.78 04/27/2010 2349      Component Value Date/Time   CALCIUM 9.0 02/07/2013 1149   ALKPHOS 81 02/07/2013 1149   AST 20 02/07/2013 1149   ALT 19 02/07/2013 1149   BILITOT 0.3 02/07/2013 1149         Impression and Plan: Stacie Cardenas is a 33 year old African American female. She has a history of iron deficiency. Again  should menometrorrhagia.  I would be surprised if her iron is low. I will get her blood smear. Her red cells were normochromic and normocytic. There maybe a couple small red cells.  Her MCV is elevated. This usually is a good sign for her.  We will see what her TSH shows. We will see what her iron studies show.  Like to get her back in about 6 weeks' time to so we can followup with her and make sure that we continue to replace her iron as needed.  I went over her lab work with her. I looked at her blood smear.   Stacie Napoleon, MD 4/15/201512:21 PM

## 2013-09-25 ENCOUNTER — Telehealth: Payer: Self-pay | Admitting: Hematology & Oncology

## 2013-09-25 NOTE — Telephone Encounter (Signed)
Pt made 4-22 iron infusion

## 2013-09-25 NOTE — Telephone Encounter (Signed)
Left pt message to call for appointment next week.

## 2013-10-01 ENCOUNTER — Ambulatory Visit (HOSPITAL_BASED_OUTPATIENT_CLINIC_OR_DEPARTMENT_OTHER): Payer: BC Managed Care – PPO

## 2013-10-01 VITALS — BP 128/89 | HR 93 | Temp 98.1°F | Resp 18

## 2013-10-01 DIAGNOSIS — D508 Other iron deficiency anemias: Secondary | ICD-10-CM

## 2013-10-01 MED ORDER — SODIUM CHLORIDE 0.9 % IV SOLN
1020.0000 mg | Freq: Once | INTRAVENOUS | Status: AC
  Administered 2013-10-01: 1020 mg via INTRAVENOUS
  Filled 2013-10-01: qty 34

## 2013-10-01 MED ORDER — SODIUM CHLORIDE 0.9 % IV SOLN
Freq: Once | INTRAVENOUS | Status: AC
  Administered 2013-10-01: 11:00:00 via INTRAVENOUS

## 2013-10-01 NOTE — Patient Instructions (Signed)
Ferumoxytol injection What is this medicine? FERUMOXYTOL is an iron complex. Iron is used to make healthy red blood cells, which carry oxygen and nutrients throughout the body. This medicine is used to treat iron deficiency anemia in people with chronic kidney disease. This medicine may be used for other purposes; ask your health care provider or pharmacist if you have questions. What should I tell my health care provider before I take this medicine? They need to know if you have any of these conditions: -anemia not caused by low iron levels -high levels of iron in the blood -magnetic resonance imaging (MRI) test scheduled -an unusual or allergic reaction to iron, other medicines, foods, dyes, or preservatives -pregnant or trying to get pregnant -breast-feeding How should I use this medicine? This medicine is for infusion into a vein. It is given by a health care professional in a hospital or clinic setting. Talk to your pediatrician regarding the use of this medicine in children. Special care may be needed. Overdosage: If you think you've taken too much of this medicine contact a poison control center or emergency room at once. Overdosage: If you think you have taken too much of this medicine contact a poison control center or emergency room at once. NOTE: This medicine is only for you. Do not share this medicine with others. What if I miss a dose? It is important not to miss your dose. Call your doctor or health care professional if you are unable to keep an appointment. What may interact with this medicine? This medicine may interact with the following medications: -other iron products This list may not describe all possible interactions. Give your health care provider a list of all the medicines, herbs, non-prescription drugs, or dietary supplements you use. Also tell them if you smoke, drink alcohol, or use illegal drugs. Some items may interact with your medicine. What should I watch  for while using this medicine? Visit your doctor or healthcare professional regularly. Tell your doctor or healthcare professional if your symptoms do not start to get better or if they get worse. You may need blood work done while you are taking this medicine. You may need to follow a special diet. Talk to your doctor. Foods that contain iron include: whole grains/cereals, dried fruits, beans, or peas, leafy green vegetables, and organ meats (liver, kidney). What side effects may I notice from receiving this medicine? Side effects that you should report to your doctor or health care professional as soon as possible: -allergic reactions like skin rash, itching or hives, swelling of the face, lips, or tongue -breathing problems -changes in blood pressure -feeling faint or lightheaded, falls -fever or chills -flushing, sweating, or hot feelings -swelling of the ankles or feet Side effects that usually do not require medical attention (Report these to your doctor or health care professional if they continue or are bothersome.): -diarrhea -headache -nausea, vomiting -stomach pain This list may not describe all possible side effects. Call your doctor for medical advice about side effects. You may report side effects to FDA at 1-800-FDA-1088. Where should I keep my medicine? This drug is given in a hospital or clinic and will not be stored at home. NOTE: This sheet is a summary. It may not cover all possible information. If you have questions about this medicine, talk to your doctor, pharmacist, or health care provider.  2012, [employer]. (02/19/2008 9:48:25 PM) 

## 2013-11-05 ENCOUNTER — Ambulatory Visit: Payer: Self-pay

## 2013-11-05 ENCOUNTER — Other Ambulatory Visit (HOSPITAL_BASED_OUTPATIENT_CLINIC_OR_DEPARTMENT_OTHER): Payer: BC Managed Care – PPO | Admitting: Lab

## 2013-11-05 ENCOUNTER — Encounter: Payer: Self-pay | Admitting: Hematology & Oncology

## 2013-11-05 ENCOUNTER — Ambulatory Visit (HOSPITAL_BASED_OUTPATIENT_CLINIC_OR_DEPARTMENT_OTHER): Payer: BC Managed Care – PPO | Admitting: Hematology & Oncology

## 2013-11-05 VITALS — BP 117/74 | HR 83 | Temp 98.1°F | Resp 12 | Wt 215.0 lb

## 2013-11-05 DIAGNOSIS — D500 Iron deficiency anemia secondary to blood loss (chronic): Secondary | ICD-10-CM

## 2013-11-05 DIAGNOSIS — D509 Iron deficiency anemia, unspecified: Secondary | ICD-10-CM

## 2013-11-05 DIAGNOSIS — R635 Abnormal weight gain: Secondary | ICD-10-CM

## 2013-11-05 DIAGNOSIS — D508 Other iron deficiency anemias: Secondary | ICD-10-CM

## 2013-11-05 LAB — RETICULOCYTES (CHCC)
ABS Retic: 83.2 10*3/uL (ref 19.0–186.0)
RBC.: 4.62 MIL/uL (ref 3.87–5.11)
RETIC CT PCT: 1.8 % (ref 0.4–2.3)

## 2013-11-05 LAB — CBC WITH DIFFERENTIAL (CANCER CENTER ONLY)
BASO#: 0 10*3/uL (ref 0.0–0.2)
BASO%: 0.2 % (ref 0.0–2.0)
EOS%: 5 % (ref 0.0–7.0)
Eosinophils Absolute: 0.3 10*3/uL (ref 0.0–0.5)
HCT: 41.6 % (ref 34.8–46.6)
HGB: 13.7 g/dL (ref 11.6–15.9)
LYMPH#: 3 10*3/uL (ref 0.9–3.3)
LYMPH%: 45.3 % (ref 14.0–48.0)
MCH: 29.5 pg (ref 26.0–34.0)
MCHC: 32.9 g/dL (ref 32.0–36.0)
MCV: 90 fL (ref 81–101)
MONO#: 0.6 10*3/uL (ref 0.1–0.9)
MONO%: 8.5 % (ref 0.0–13.0)
NEUT#: 2.7 10*3/uL (ref 1.5–6.5)
NEUT%: 41 % (ref 39.6–80.0)
Platelets: 216 10*3/uL (ref 145–400)
RBC: 4.64 10*6/uL (ref 3.70–5.32)
RDW: 13.4 % (ref 11.1–15.7)
WBC: 6.6 10*3/uL (ref 3.9–10.0)

## 2013-11-05 NOTE — Progress Notes (Signed)
No treatment today per dr. Marin Olp

## 2013-11-06 LAB — IRON AND TIBC CHCC
%SAT: 50 % (ref 21–57)
Iron: 103 ug/dL (ref 41–142)
TIBC: 205 ug/dL — AB (ref 236–444)
UIBC: 102 ug/dL — ABNORMAL LOW (ref 120–384)

## 2013-11-06 LAB — FERRITIN CHCC: Ferritin: 478 ng/ml — ABNORMAL HIGH (ref 9–269)

## 2013-11-06 NOTE — Progress Notes (Signed)
Hematology and Oncology Follow Up Visit  Stacie Cardenas [id number] 1980/08/10 33 y.o. 11/06/2013   Principle Diagnosis:   Iron deficiency anemia  Menometrorrhagia  Current Therapy:    IV iron as indicated     Interim History:  Ms.  Stacie Cardenas is back for followup. [name] been doing very well. Last saw her back in April. At that point time, her ferritin was 118 with an iron saturation 24%. She did get iron. Patient feels better. [name] having heavy cycles. She currently is on her cycle right now. [name] had no cough. Is been no leg swelling. [name] had no rashes. [name] not chewing ice.  Medications: Current outpatient prescriptions:Adapalene-Benzoyl Peroxide 0.1-2.5 % gel, Apply 1 application topically every morning., Disp: , Rfl: ;  cetirizine-pseudoephedrine (ZYRTEC-D) 5-120 MG per tablet, Take 1 tablet by mouth as needed., Disp: , Rfl: ;  ondansetron (ZOFRAN) 4 MG tablet, Take 4 mg by mouth as needed for nausea., Disp: , Rfl:  polyethylene glycol powder (GLYCOLAX/MIRALAX) powder, Take 17 g by mouth as needed. To prevent constipation., Disp: , Rfl: ;  simvastatin (ZOCOR) 40 MG tablet, Take 1 tablet (40 mg total) by mouth every evening., Disp: 30 tablet, Rfl: 11;  SUMAtriptan (IMITREX) 100 MG tablet, Take 100 mg by mouth as needed for migraine., Disp: , Rfl:  telmisartan-hydrochlorothiazide (MICARDIS HCT) 80-12.5 MG per tablet, Take 1 tablet by mouth daily., Disp: 30 tablet, Rfl: 6;  fluticasone (FLONASE) 50 MCG/ACT nasal spray, Place 2 sprays into the nose as needed., Disp: , Rfl:   Allergies: No Known Allergies  Past Medical History, Surgical history, Social history, and Family History were reviewed and updated.  Review of Systems: As above  Physical Exam:  weight is 215 lb (97.523 kg). Her oral temperature is 98.1 F (36.7 C). Her blood pressure is 117/74 and her pulse is 83. Her respiration is 12.   Somewhat obese after not having a. Head and neck exam shows no ocular or oral lesions. Shows  no adenopathy in the neck. Lungs are clear. Cardiac exam regular rate rhythm. Abdomen is soft. She is somewhat obese. She is no palpable liver or spleen tip. Extremities shows no clubbing cyanosis or edema. Neurological exam is nonfocal. Skin exam is without rashes. Lymph nodes are none palpable.  Lab Results  Component Value Date   WBC 6.6 11/05/2013   HGB 13.7 11/05/2013   HCT 41.6 11/05/2013   MCV 90 11/05/2013   PLT 216 11/05/2013     Chemistry      Component Value Date/Time   NA 138 02/07/2013 1149   K 4.2 02/07/2013 1149   CL 104 02/07/2013 1149   CO2 28 02/07/2013 1149   BUN 8 02/07/2013 1149   CREATININE 0.70 02/07/2013 1149   CREATININE 0.78 04/27/2010 2349      Component Value Date/Time   CALCIUM 9.0 02/07/2013 1149   ALKPHOS 81 02/07/2013 1149   AST 20 02/07/2013 1149   ALT 19 02/07/2013 1149   BILITOT 0.3 02/07/2013 1149     Ferritin is 478. Iron saturation is 50%. Total iron is 103. Reticulocyte count is 1.8%    Impression and Plan: Ms. Stacie Cardenas is a 33 year old African female. She has history of iron deficiency. Prilosec rate today. Her hemoglobin is doing quite well. Her MCV continues to go up.  She is not part of his right now.  And we'll probably get her back in about 3 months or so. I think this would be reasonable. She was back to see  [place] sooner and have the laboratory done if she feels that she is becoming iron deficient.   Volanda Napoleon, MD 5/28/20155:33 PM

## 2013-11-07 ENCOUNTER — Telehealth: Payer: Self-pay | Admitting: *Deleted

## 2013-11-07 NOTE — Telephone Encounter (Addendum)
Message copied by Lenn Sink on Fri Nov 07, 2013 11:21 AM ------      Message from: Burney Gauze R      Created: Thu Nov 06, 2013  6:03 PM       Please call and let her no that her iron levels are much better thanks. Pete ------Left voicemail informing pt that iron levels are better.

## 2013-11-11 ENCOUNTER — Other Ambulatory Visit: Payer: Self-pay | Admitting: Physician Assistant

## 2013-11-11 ENCOUNTER — Ambulatory Visit (INDEPENDENT_AMBULATORY_CARE_PROVIDER_SITE_OTHER): Payer: BC Managed Care – PPO | Admitting: Physician Assistant

## 2013-11-11 ENCOUNTER — Encounter: Payer: Self-pay | Admitting: Physician Assistant

## 2013-11-11 VITALS — BP 120/74 | HR 72 | Ht 64.0 in | Wt 213.0 lb

## 2013-11-11 DIAGNOSIS — R197 Diarrhea, unspecified: Secondary | ICD-10-CM

## 2013-11-11 DIAGNOSIS — R109 Unspecified abdominal pain: Secondary | ICD-10-CM

## 2013-11-11 LAB — CBC WITH DIFFERENTIAL/PLATELET
Basophils Absolute: 0.1 10*3/uL (ref 0.0–0.1)
Basophils Relative: 1 % (ref 0–1)
Eosinophils Absolute: 0.3 10*3/uL (ref 0.0–0.7)
Eosinophils Relative: 5 % (ref 0–5)
HEMATOCRIT: 39 % (ref 36.0–46.0)
HEMOGLOBIN: 13.3 g/dL (ref 12.0–15.0)
LYMPHS ABS: 2.7 10*3/uL (ref 0.7–4.0)
LYMPHS PCT: 44 % (ref 12–46)
MCH: 28.9 pg (ref 26.0–34.0)
MCHC: 34.1 g/dL (ref 30.0–36.0)
MCV: 84.6 fL (ref 78.0–100.0)
MONO ABS: 0.5 10*3/uL (ref 0.1–1.0)
Monocytes Relative: 8 % (ref 3–12)
Neutro Abs: 2.6 10*3/uL (ref 1.7–7.7)
Neutrophils Relative %: 42 % — ABNORMAL LOW (ref 43–77)
Platelets: 258 10*3/uL (ref 150–400)
RBC: 4.61 MIL/uL (ref 3.87–5.11)
RDW: 14.1 % (ref 11.5–15.5)
WBC: 6.1 10*3/uL (ref 4.0–10.5)

## 2013-11-11 LAB — COMPLETE METABOLIC PANEL WITH GFR
ALBUMIN: 4.1 g/dL (ref 3.5–5.2)
ALT: 20 U/L (ref 0–35)
AST: 17 U/L (ref 0–37)
Alkaline Phosphatase: 73 U/L (ref 39–117)
BUN: 8 mg/dL (ref 6–23)
CALCIUM: 9.5 mg/dL (ref 8.4–10.5)
CHLORIDE: 101 meq/L (ref 96–112)
CO2: 27 meq/L (ref 19–32)
Creat: 0.77 mg/dL (ref 0.50–1.10)
GFR, Est Non African American: >89 mL/min
Glucose, Bld: 92 mg/dL (ref 70–99)
Potassium: 4.2 mEq/L (ref 3.5–5.3)
Sodium: 137 mEq/L (ref 135–145)
Total Bilirubin: 0.3 mg/dL (ref 0.2–1.2)
Total Protein: 7.3 g/dL (ref 6.0–8.3)

## 2013-11-11 MED ORDER — HYOSCYAMINE SULFATE ER 0.375 MG PO TB12: 0.3750 mg | ORAL_TABLET | Freq: Two times a day (BID) | ORAL | Status: DC

## 2013-11-11 NOTE — Progress Notes (Signed)
   Subjective:    Patient ID: Stacie Cardenas, female    DOB: [birth year], 33 y.o.   MRN: [id number]  HPI Patient is a 33 year old female who presents to the clinic with diarrhea for one week. She has also had episodes of abdominal cramping. She does have a history of IBS symptoms. Current symptoms started about a week ago while she was on her cycle. She has had 2-3 episodes of loose stools every day for one week. She has had a tinge of blood in her stools been on hold paper. She denies any new medications or supplements. She has not had any fever, chills, nausea or vomiting. She denies any recent travel. She drinks from city water. [name] not had any recent antibiotics. [name] tried Imodium for 3 days with no benefit. She continues to eat and drink. Patient has never had a colonoscopy.   Review of Systems  All other systems reviewed and are negative.      Objective:   Physical Exam  Constitutional: She is oriented to person, place, and time. She appears well-developed and well-nourished. No distress.  HENT:  Head: Normocephalic and atraumatic.  Cardiovascular: Normal rate, regular rhythm and normal heart sounds.   Pulmonary/Chest: Effort normal and breath sounds normal.  Negative for CVA tenderness.  Genitourinary:  Pt declined rectal exam and hemoccult.   Neurological: She is alert and oriented to person, place, and time.  Skin: She is not diaphoretic.  Psychiatric: She has a normal mood and affect. Her behavior is normal.          Assessment & Plan:  Diarrhea/abdominal cramping/IBS/nausea- unclear etiology at this time. Will get CBC and CMP. Will order stool cultures. zofran given for nausea. levibid given for abdominal cramping. Keep to a BRAT diet stay hydrated. Pt declined rectal exam today. Discussed may need to do if symptoms do not improve. Also may need to consider colonoscopy.

## 2013-11-11 NOTE — Patient Instructions (Signed)
Diet for Diarrhea, Adult Frequent, runny stools (diarrhea) may be caused or worsened by food or drink. Diarrhea may be relieved by changing your diet. Since diarrhea can last up to 7 days, it is easy for you to lose too much fluid from the body and become dehydrated. Fluids that are lost need to be replaced. Along with a modified diet, make sure you drink enough fluids to keep your urine clear or pale yellow. DIET INSTRUCTIONS  Ensure adequate fluid intake (hydration): have 1 cup (8 oz) of fluid for each diarrhea episode. Avoid fluids that contain simple sugars or sports drinks, fruit juices, whole milk products, and sodas. Your urine should be clear or pale yellow if you are drinking enough fluids. Hydrate with an oral rehydration solution that you can purchase at pharmacies, retail stores, and online. You can prepare an oral rehydration solution at home by mixing the following ingredients together:    tsp table salt.   tsp baking soda.   tsp salt substitute containing potassium chloride.  1  tablespoons sugar.  1 L (34 oz) of water.  Certain foods and beverages may increase the speed at which food moves through the gastrointestinal (GI) tract. These foods and beverages should be avoided and include:  Caffeinated and alcoholic beverages.  High-fiber foods, such as raw fruits and vegetables, nuts, seeds, and whole grain breads and cereals.  Foods and beverages sweetened with sugar alcohols, such as xylitol, sorbitol, and mannitol.  Some foods may be well tolerated and may help thicken stool including:  Starchy foods, such as rice, toast, pasta, low-sugar cereal, oatmeal, grits, baked potatoes, crackers, and bagels.   Bananas.   Applesauce.  Add probiotic-rich foods to help increase healthy bacteria in the GI tract, such as yogurt and fermented milk products. RECOMMENDED FOODS AND BEVERAGES Starches Choose foods with less than 2 g of fiber per serving.  Recommended:  White,  [place], and pita breads, plain rolls, buns, bagels. Plain muffins, matzo. Soda, saltine, or graham crackers. Pretzels, melba toast, zwieback. Cooked cereals made with water: cornmeal, farina, cream cereals. Dry cereals: refined corn, wheat, rice. Potatoes prepared any way without skins, refined macaroni, spaghetti, noodles, refined rice.  Avoid:  Bread, rolls, or crackers made with whole wheat, multi-grains, rye, bran seeds, nuts, or coconut. Corn tortillas or taco shells. Cereals containing whole grains, multi-grains, bran, coconut, nuts, raisins. Cooked or dry oatmeal. Coarse wheat cereals, granola. Cereals advertised as "high-fiber." Potato skins. Whole grain pasta, wild or brown rice. Popcorn. Sweet potatoes, yams. Sweet rolls, doughnuts, waffles, pancakes, sweet breads. Vegetables  Recommended: Strained tomato and vegetable juices. Most well-cooked and canned vegetables without seeds. Fresh: Tender lettuce, cucumber without the skin, cabbage, spinach, bean sprouts.  Avoid: Fresh, cooked, or canned: Artichokes, baked beans, beet greens, broccoli, Brussels sprouts, corn, kale, legumes, peas, sweet potatoes. Cooked: Green or red cabbage, spinach. Avoid large servings of any vegetables because vegetables shrink when cooked, and they contain more fiber per serving than fresh vegetables. Fruit  Recommended: Cooked or canned: Apricots, applesauce, cantaloupe, cherries, fruit cocktail, grapefruit, grapes, kiwi, mandarin oranges, peaches, pears, plums, watermelon. Fresh: Apples without skin, ripe banana, grapes, cantaloupe, cherries, grapefruit, peaches, oranges, plums. Keep servings limited to  cup or 1 piece.  Avoid: Fresh: Apples with skin, apricots, mangoes, pears, raspberries, strawberries. Prune juice, stewed or dried prunes. Dried fruits, raisins, dates. Large servings of all fresh fruits. Protein  Recommended: Ground or well-cooked tender beef, ham, veal, lamb, pork, or poultry. Eggs. Fish,  oysters, shrimp,  lobster, other seafoods. Liver, organ meats.  Avoid: Tough, fibrous meats with gristle. Peanut butter, smooth or chunky. Cheese, nuts, seeds, legumes, dried peas, beans, lentils. Dairy  Recommended: Yogurt, lactose-free milk, kefir, drinkable yogurt, buttermilk, soy milk, or plain hard cheese.  Avoid: Milk, chocolate milk, beverages made with milk, such as milkshakes. Soups  Recommended: Bouillon, broth, or soups made from allowed foods. Any strained soup.  Avoid: Soups made from vegetables that are not allowed, cream or milk-based soups. Desserts and Sweets  Recommended: Sugar-free gelatin, sugar-free frozen ice pops made without sugar alcohol.  Avoid: Plain cakes and cookies, pie made with fruit, pudding, custard, cream pie. Gelatin, fruit, ice, sherbet, frozen ice pops. Ice cream, ice milk without nuts. Plain hard candy, honey, jelly, molasses, syrup, sugar, chocolate syrup, gumdrops, marshmallows. Fats and Oils  Recommended: Limit fats to less than 8 tsp per day.  Avoid: Seeds, nuts, olives, avocados. Margarine, butter, cream, mayonnaise, salad oils, plain salad dressings. Plain gravy, crisp bacon without rind. Beverages  Recommended: Water, decaffeinated teas, oral rehydration solutions, sugar-free beverages not sweetened with sugar alcohols.  Avoid: Fruit juices, caffeinated beverages (coffee, tea, soda), alcohol, sports drinks, or lemon-lime soda. Condiments  Recommended: Ketchup, mustard, horseradish, vinegar, cocoa powder. Spices in moderation: allspice, basil, bay leaves, celery powder or leaves, cinnamon, cumin powder, curry powder, ginger, mace, marjoram, onion or garlic powder, oregano, paprika, parsley flakes, ground pepper, rosemary, sage, savory, tarragon, thyme, turmeric.  Avoid: Coconut, honey. Document Released: 08/19/2003 Document Revised: 02/21/2012 Document Reviewed: 10/13/2011 [facility] Patient Information 2014 [place].   Diarrhea Diarrhea is frequent loose and watery bowel movements. It can cause you to feel weak and dehydrated. Dehydration can cause you to become tired and thirsty, have a dry mouth, and have decreased urination that often is dark yellow. Diarrhea is a sign of another problem, most often an infection that will not last long. In most cases, diarrhea typically lasts 2 3 days. However, it can last longer if it is a sign of something more serious. It is important to treat your diarrhea as directed by your caregive to lessen or prevent future episodes of diarrhea. CAUSES  Some common causes include:  Gastrointestinal infections caused by viruses, bacteria, or parasites.  Food poisoning or food allergies.  Certain medicines, such as antibiotics, chemotherapy, and laxatives.  Artificial sweeteners and fructose.  Digestive disorders. HOME CARE INSTRUCTIONS  Ensure adequate fluid intake (hydration): have 1 cup (8 oz) of fluid for each diarrhea episode. Avoid fluids that contain simple sugars or sports drinks, fruit juices, whole milk products, and sodas. Your urine should be clear or pale yellow if you are drinking enough fluids. Hydrate with an oral rehydration solution that you can purchase at pharmacies, retail stores, and online. You can prepare an oral rehydration solution at home by mixing the following ingredients together:    tsp table salt.   tsp baking soda.   tsp salt substitute containing potassium chloride.  1  tablespoons sugar.  1 L (34 oz) of water.  Certain foods and beverages may increase the speed at which food moves through the gastrointestinal (GI) tract. These foods and beverages should be avoided and include:  Caffeinated and alcoholic beverages.  High-fiber foods, such as raw fruits and vegetables, nuts, seeds, and whole grain breads and cereals.  Foods and beverages sweetened with sugar alcohols, such as xylitol, sorbitol, and mannitol.  Some foods may  be well tolerated and may help thicken stool including:  Starchy foods, such as rice,  toast, pasta, low-sugar cereal, oatmeal, grits, baked potatoes, crackers, and bagels.  Bananas.  Applesauce.  Add probiotic-rich foods to help increase healthy bacteria in the GI tract, such as yogurt and fermented milk products.  Wash your hands well after each diarrhea episode.  Only take over-the-counter or prescription medicines as directed by your caregiver.  Take a warm bath to relieve any burning or pain from frequent diarrhea episodes. SEEK IMMEDIATE MEDICAL CARE IF:   You are unable to keep fluids down.  You have persistent vomiting.  You have blood in your stool, or your stools are black and tarry.  You do not urinate in 6 8 hours, or there is only a small amount of very dark urine.  You have abdominal pain that increases or localizes.  You have weakness, dizziness, confusion, or lightheadedness.  You have a severe headache.  Your diarrhea gets worse or does not get better.  You have a fever or persistent symptoms for more than 2 3 days.  You have a fever and your symptoms suddenly get worse. MAKE SURE YOU:   Understand these instructions.  Will watch your condition.  Will get help right away if you are not doing well or get worse. Document Released: 05/19/2002 Document Revised: 05/15/2012 Document Reviewed: 02/04/2012 [facility] Patient Information 2014 [place], [place].

## 2013-11-13 LAB — OVA AND PARASITE EXAMINATION: OP: NONE SEEN

## 2013-11-13 LAB — C. DIFFICILE GDH AND TOXIN A/B
C. DIFF TOXIN A/B: NOT DETECTED
C. DIFFICILE GDH: NOT DETECTED

## 2013-11-14 ENCOUNTER — Other Ambulatory Visit: Payer: Self-pay

## 2013-11-14 MED ORDER — DICYCLOMINE HCL 10 MG PO CAPS: 10.0000 mg | ORAL_CAPSULE | Freq: Two times a day (BID) | ORAL | Status: DC

## 2013-11-16 LAB — STOOL CULTURE

## 2013-11-18 ENCOUNTER — Other Ambulatory Visit: Payer: Self-pay | Admitting: Physician Assistant

## 2013-11-18 MED ORDER — DIPHENOXYLATE-ATROPINE 2.5-0.025 MG PO TABS: 2.0000 | ORAL_TABLET | Freq: Four times a day (QID) | ORAL | Status: DC | PRN

## 2013-12-09 ENCOUNTER — Telehealth: Payer: Self-pay | Admitting: *Deleted

## 2013-12-09 ENCOUNTER — Other Ambulatory Visit: Payer: Self-pay | Admitting: Physician Assistant

## 2013-12-09 DIAGNOSIS — R1084 Generalized abdominal pain: Secondary | ICD-10-CM

## 2013-12-09 DIAGNOSIS — R197 Diarrhea, unspecified: Secondary | ICD-10-CM

## 2013-12-09 NOTE — Telephone Encounter (Signed)
Pt called stating that the bentyl & lomotil has helped her some but she would really like an urgent referral to GI because she is still having a lot of abd pain as well as loose stools all the time.  She states that when the pain hits she barely makes it to the bathroom.  She thinks it may be gallbladder related.

## 2014-02-04 ENCOUNTER — Other Ambulatory Visit: Payer: BC Managed Care – PPO | Admitting: Lab

## 2014-02-04 ENCOUNTER — Ambulatory Visit: Payer: BC Managed Care – PPO

## 2014-02-04 ENCOUNTER — Ambulatory Visit: Payer: BC Managed Care – PPO | Admitting: Hematology & Oncology

## 2014-02-09 ENCOUNTER — Other Ambulatory Visit (HOSPITAL_BASED_OUTPATIENT_CLINIC_OR_DEPARTMENT_OTHER): Payer: BC Managed Care – PPO | Admitting: Lab

## 2014-02-09 ENCOUNTER — Ambulatory Visit (HOSPITAL_BASED_OUTPATIENT_CLINIC_OR_DEPARTMENT_OTHER): Payer: BC Managed Care – PPO | Admitting: Family

## 2014-02-09 ENCOUNTER — Encounter: Payer: Self-pay | Admitting: Hematology & Oncology

## 2014-02-09 VITALS — BP 133/94 | HR 76 | Temp 98.2°F | Resp 14 | Ht 64.0 in | Wt 215.0 lb

## 2014-02-09 DIAGNOSIS — D508 Other iron deficiency anemias: Secondary | ICD-10-CM

## 2014-02-09 DIAGNOSIS — D509 Iron deficiency anemia, unspecified: Secondary | ICD-10-CM

## 2014-02-09 LAB — CBC WITH DIFFERENTIAL (CANCER CENTER ONLY)
BASO#: 0 10*3/uL (ref 0.0–0.2)
BASO%: 0.3 % (ref 0.0–2.0)
EOS%: 3.1 % (ref 0.0–7.0)
Eosinophils Absolute: 0.2 10*3/uL (ref 0.0–0.5)
HCT: 40.3 % (ref 34.8–46.6)
HEMOGLOBIN: 13.4 g/dL (ref 11.6–15.9)
LYMPH#: 2.8 10*3/uL (ref 0.9–3.3)
LYMPH%: 44.4 % (ref 14.0–48.0)
MCH: 30 pg (ref 26.0–34.0)
MCHC: 33.3 g/dL (ref 32.0–36.0)
MCV: 90 fL (ref 81–101)
MONO#: 0.4 10*3/uL (ref 0.1–0.9)
MONO%: 6.6 % (ref 0.0–13.0)
NEUT#: 2.8 10*3/uL (ref 1.5–6.5)
NEUT%: 45.6 % (ref 39.6–80.0)
Platelets: 243 10*3/uL (ref 145–400)
RBC: 4.47 10*6/uL (ref 3.70–5.32)
RDW: 12.4 % (ref 11.1–15.7)
WBC: 6.2 10*3/uL (ref 3.9–10.0)

## 2014-02-09 LAB — RETICULOCYTES (CHCC)
ABS RETIC: 95.8 10*3/uL (ref 19.0–186.0)
RBC.: 4.56 MIL/uL (ref 3.87–5.11)
RETIC CT PCT: 2.1 % (ref 0.4–2.3)

## 2014-02-09 LAB — IRON AND TIBC CHCC
%SAT: 32 % (ref 21–57)
Iron: 71 ug/dL (ref 41–142)
TIBC: 223 ug/dL — ABNORMAL LOW (ref 236–444)
UIBC: 151 ug/dL (ref 120–384)

## 2014-02-09 LAB — FERRITIN CHCC: Ferritin: 178 ng/ml (ref 9–269)

## 2014-02-09 NOTE — Progress Notes (Signed)
[place]  [phone] [phone]  ID: Stacie Cardenas OB: 20-Oct-1980 MR#: [id number] ZHY#:[id number] Patient Care Team: [name], PA-C as PCP - General (Family Medicine)  DIAGNOSIS: Iron deficiency anemia  Menometrorrhagia  INTERVAL HISTORY: Stacie Cardenas is here today for a follow-up. She is doing ok. She is having some symptoms at this time. She is very tired, cold and chews ice "all the time". She denies fever, chills, n/v, cough, rash, headache, dizziness, SOB, chest pain, palpitations, abdominal pain, constipation, diarrhea, blood in urine or stool. She last had iron in April. In May, her ferritin was 478 with an iron saturation 50%.  She is still having heavy cycles. She has had no pain or bleeding. She denies swelling, tenderness, numbness or tingling in her extremities.   CURRENT TREATMENT: IV iron as indicated  REVIEW OF SYSTEMS: All other 10 point review of systems is negative except for those issues mentioned above.   PAST MEDICAL HISTORY: Past Medical History  Diagnosis Date  . Hypertension   . Hypercholesterolemia   . Other specified iron deficiency anemias 03/17/2013   PAST SURGICAL HISTORY: Past Surgical History  Procedure Laterality Date  . Cesarean section     FAMILY HISTORY No family history on file.  GYNECOLOGIC HISTORY:  No LMP recorded.   SOCIAL HISTORY:  History   Social History  . Marital Status: Married    Spouse Name: N/A    Number of Children: N/A  . Years of Education: N/A   Occupational History  . Not on file.   Social History Main Topics  . Smoking status: Never Smoker   . Smokeless tobacco: Never Used     Comment: never used tobacco  . Alcohol Use: No  . Drug Use: No  . Sexual Activity: Yes    Birth Control/ Protection: Surgical   Other Topics Concern  . Not on file   Social History Narrative  . No narrative on file   ADVANCED DIRECTIVES: <no information>  HEALTH MAINTENANCE: History   Substance Use Topics  . Smoking status: Never Smoker   . Smokeless tobacco: Never Used     Comment: never used tobacco  . Alcohol Use: No   Colonoscopy: PAP: Bone density: Lipid panel:  Allergies  Allergen Reactions  . Ampicillin Diarrhea    Current Outpatient Prescriptions  Medication Sig Dispense Refill  . Adapalene-Benzoyl Peroxide 0.1-2.5 % gel Apply 1 application topically every morning.      . cetirizine-pseudoephedrine (ZYRTEC-D) 5-120 MG per tablet Take 1 tablet by mouth as needed.      . dicyclomine (BENTYL) 10 MG capsule Take 1 capsule (10 mg total) by mouth 2 (two) times daily.  60 capsule  0  . diphenoxylate-atropine (LOMOTIL) 2.5-0.025 MG per tablet Take 2 tablets by mouth 4 (four) times daily as needed for diarrhea or loose stools.  30 tablet  0  . fluticasone (FLONASE) 50 MCG/ACT nasal spray Place 2 sprays into the nose as needed.      . ondansetron (ZOFRAN) 4 MG tablet Take 4 mg by mouth as needed for nausea.      . simvastatin (ZOCOR) 40 MG tablet Take 1 tablet (40 mg total) by mouth every evening.  30 tablet  11  . SUMAtriptan (IMITREX) 100 MG tablet Take 100 mg by mouth as needed for migraine.      Marland Kitchen telmisartan-hydrochlorothiazide (MICARDIS HCT) 80-12.5 MG per tablet Take 1 tablet by mouth daily.  30 tablet  6   No  current facility-administered medications for this visit.   OBJECTIVE: Filed Vitals:   02/09/14 1109  BP: 133/94  Pulse: 76  Temp: 98.2 F (36.8 C)  Resp: 14   Body mass index is 36.89 kg/(m^2). ECOG FS:1 - Symptomatic but completely ambulatory Ocular: Sclerae unicteric, pupils equal, round and reactive to light Ear-nose-throat: Oropharynx clear, dentition fair Lymphatic: No cervical or supraclavicular adenopathy Lungs no rales or rhonchi, good excursion bilaterally Heart regular rate and rhythm, no murmur appreciated Abd soft, nontender, positive bowel sounds MSK no focal spinal tenderness, no joint edema Neuro: non-focal, well-oriented,  appropriate affect Breasts: Deferred  LAB RESULTS: CMP     Component Value Date/Time   NA 137 11/11/2013 1040   K 4.2 11/11/2013 1040   CL 101 11/11/2013 1040   CO2 27 11/11/2013 1040   GLUCOSE 92 11/11/2013 1040   BUN 8 11/11/2013 1040   CREATININE 0.77 11/11/2013 1040   CREATININE 0.78 04/27/2010 2349   CALCIUM 9.5 11/11/2013 1040   PROT 7.3 11/11/2013 1040   ALBUMIN 4.1 11/11/2013 1040   AST 17 11/11/2013 1040   ALT 20 11/11/2013 1040   ALKPHOS 73 11/11/2013 1040   BILITOT 0.3 11/11/2013 1040   GFRNONAA >89 11/11/2013 1040   GFRAA >89 11/11/2013 1040   No results found for this basename: SPEP, UPEP,  kappa and lambda light chains   Lab Results  Component Value Date   WBC 6.2 02/09/2014   NEUTROABS 2.8 02/09/2014   HGB 13.4 02/09/2014   HCT 40.3 02/09/2014   MCV 90 02/09/2014   PLT 243 02/09/2014   No results found for this basename: LABCA2   No components found with this basename: OYDXA128   No results found for this basename: INR,  in the last 168 hours  STUDIES: No results found.  ASSESSMENT/PLAN: Stacie Cardenas is a 33 year old African female with a history of iron deficiency.  Her hemoglobin is is 13.4 MCV 90 today.  We will wait and see what her iron studies show.  We will go ahead and make her an appointment for iron on Friday since she is symptomatic. We will see her back in 2 months for follow-up and labs.  She is in agreement with this and knows to call here with any questions or concerns. We can certainly see her sooner if need be.   Eliezer Bottom, NP 02/09/2014 1:13 PM

## 2014-02-10 ENCOUNTER — Telehealth: Payer: Self-pay | Admitting: *Deleted

## 2014-02-10 NOTE — Telephone Encounter (Addendum)
Message copied by Orlando Penner on Tue Feb 10, 2014  3:55 PM ------      Message from: Volanda Napoleon      Created: Tue Feb 10, 2014  7:49 AM       Call - iron is dropping again.  please set up Feraheme 1028m x 1 dose.  Pete ------This message given to pt.  She had already scheduled this appt for Friday 02/13/14

## 2014-02-10 NOTE — Telephone Encounter (Signed)
Message copied by Orlando Penner on Tue Feb 10, 2014  3:53 PM ------      Message from: Volanda Napoleon      Created: Tue Feb 10, 2014  7:49 AM       Call - iron is dropping again.  please set up Feraheme 1048m x 1 dose.  Stacie Cardenas ------

## 2014-02-12 ENCOUNTER — Other Ambulatory Visit: Payer: Self-pay | Admitting: *Deleted

## 2014-02-12 DIAGNOSIS — D508 Other iron deficiency anemias: Secondary | ICD-10-CM

## 2014-02-13 ENCOUNTER — Ambulatory Visit (HOSPITAL_BASED_OUTPATIENT_CLINIC_OR_DEPARTMENT_OTHER): Payer: BC Managed Care – PPO

## 2014-02-13 VITALS — BP 128/90 | HR 76 | Temp 97.8°F | Resp 20

## 2014-02-13 DIAGNOSIS — N920 Excessive and frequent menstruation with regular cycle: Secondary | ICD-10-CM

## 2014-02-13 DIAGNOSIS — D508 Other iron deficiency anemias: Secondary | ICD-10-CM

## 2014-02-13 DIAGNOSIS — D509 Iron deficiency anemia, unspecified: Secondary | ICD-10-CM

## 2014-02-13 MED ORDER — SODIUM CHLORIDE 0.9 % IV SOLN
Freq: Once | INTRAVENOUS | Status: AC
  Administered 2014-02-13: 10:00:00 via INTRAVENOUS

## 2014-02-13 MED ORDER — SODIUM CHLORIDE 0.9 % IV SOLN
1020.0000 mg | Freq: Once | INTRAVENOUS | Status: AC
  Administered 2014-02-13: 1020 mg via INTRAVENOUS
  Filled 2014-02-13: qty 34

## 2014-02-13 NOTE — Patient Instructions (Signed)

## 2014-03-03 ENCOUNTER — Encounter: Payer: Self-pay | Admitting: Emergency Medicine

## 2014-03-03 ENCOUNTER — Emergency Department
Admission: EM | Admit: 2014-03-03 | Discharge: 2014-03-03 | Disposition: A | Discharge status: Home / Self Care | Payer: BC Managed Care – PPO | Source: Home / Self Care | Attending: Physician Assistant | Admitting: Physician Assistant

## 2014-03-03 DIAGNOSIS — J069 Acute upper respiratory infection, unspecified: Secondary | ICD-10-CM

## 2014-03-03 HISTORY — DX: Irritable bowel syndrome, unspecified: K58.9

## 2014-03-03 LAB — POCT RAPID STREP A (OFFICE): Rapid Strep A Screen: NEGATIVE

## 2014-03-03 MED ORDER — FLUCONAZOLE 150 MG PO TABS: 150.0000 mg | ORAL_TABLET | Freq: Once | ORAL | Status: DC

## 2014-03-03 MED ORDER — AZITHROMYCIN 250 MG PO TABS: ORAL_TABLET | ORAL | Status: DC

## 2014-03-03 NOTE — ED Notes (Signed)
Stacie Cardenas c/o sore throat x this AM. Denies fever for any associated symptoms.

## 2014-03-03 NOTE — ED Provider Notes (Signed)
CSN: [id number]     Arrival date & time 03/03/14  4097 History   First MD Initiated Contact with Patient 03/03/14 [phone]     Chief Complaint  Patient presents with  . Sore Throat   (Consider location/radiation/quality/duration/timing/severity/associated sxs/prior Treatment) HPI Pt is a 33 yo female who presents to the clinic with ST that started this AM. Denies any fever, chills, n/v/d. She does feel a little congested. No one else sick in house. Not tried anything.   Past Medical History  Diagnosis Date  . Hypertension   . Hypercholesterolemia   . Other specified iron deficiency anemias 03/17/2013  . IBS (irritable bowel syndrome)    Past Surgical History  Procedure Laterality Date  . Cesarean section     History reviewed. No pertinent family history. History  Substance Use Topics  . Smoking status: Never Smoker   . Smokeless tobacco: Never Used     Comment: never used tobacco  . Alcohol Use: No   OB History   Grav Para Term Preterm Abortions TAB SAB Ect Mult Living   3 3 3  0 0 0 0 0 0 3     Review of Systems  All other systems reviewed and are negative.   Allergies  Ampicillin  Home Medications   Prior to Admission medications   Medication Sig Start Date End Date Taking? Authorizing Provider  Adapalene-Benzoyl Peroxide 0.1-2.5 % gel Apply 1 application topically every morning.    Historical Provider, MD  azithromycin (ZITHROMAX) 250 MG tablet Take 2 tablets now and then one tablet for 4 days. 03/03/14   Margaurite Salido L Landy Dunnavant, PA-C  cetirizine-pseudoephedrine (ZYRTEC-D) 5-120 MG per tablet Take 1 tablet by mouth as needed. 10/14/12   Legacie Dillingham L Jaeley Wiker, PA-C  dicyclomine (BENTYL) 10 MG capsule Take 1 capsule (10 mg total) by mouth 2 (two) times daily. 11/14/13   Mamoru Takeshita L Akyla Vavrek, PA-C  diphenoxylate-atropine (LOMOTIL) 2.5-0.025 MG per tablet Take 2 tablets by mouth 4 (four) times daily as needed for diarrhea or loose stools. 11/18/13   Yousra Ivens L Aranza Geddes, PA-C  fluconazole (DIFLUCAN) 150  MG tablet Take 1 tablet (150 mg total) by mouth once. At onset of symptoms and then once when finished with antibiotic. 03/03/14   Royetta Car Keirstyn Aydt, PA-C  fluticasone (FLONASE) 50 MCG/ACT nasal spray Place 2 sprays into the nose as needed. 10/14/12 06/11/14  Daziyah Cogan L Tonishia Steffy, PA-C  ondansetron (ZOFRAN) 4 MG tablet Take 4 mg by mouth as needed for nausea. 02/07/13   Saher Davee L Teruko Joswick, PA-C  simvastatin (ZOCOR) 40 MG tablet Take 1 tablet (40 mg total) by mouth every evening. 02/12/13   Madylyn Insco L Cavan Bearden, PA-C  SUMAtriptan (IMITREX) 100 MG tablet Take 100 mg by mouth as needed for migraine. 02/07/13   Holland Nickson L Karalee Hauter, PA-C  telmisartan-hydrochlorothiazide (MICARDIS HCT) 80-12.5 MG per tablet Take 1 tablet by mouth daily. 01/09/13   Hali Marry, MD   BP 137/94  Pulse 80  Temp(Src) 98 F (36.7 C) (Oral)  Resp 16  Wt 217 lb (98.431 kg)  SpO2 98%  LMP 02/21/2014 Physical Exam  Constitutional: She is oriented to person, place, and time. She appears well-developed and well-nourished.  HENT:  Head: Normocephalic and atraumatic.  Right TM erythematous. No blood or pus.  Left TM clear.  Negative for maxillary tenderness.  Oropharynx erythematous. No tonsillar swelling or exudate.  Nasal turbinates red and swollen bilaterally.   Eyes: Conjunctivae are normal. Right eye exhibits no discharge. Left eye exhibits no discharge.  Neck:  Normal range of motion. Neck supple.  Cardiovascular: Normal rate, regular rhythm and normal heart sounds.   Pulmonary/Chest: Effort normal and breath sounds normal. She has no wheezes.  Lymphadenopathy:    She has no cervical adenopathy.  Neurological: She is alert and oriented to person, place, and time.  Skin: Skin is dry.  Psychiatric: She has a normal mood and affect. Her behavior is normal.    ED Course  Procedures (including critical care time) Labs Review Labs Reviewed  POCT RAPID STREP A (OFFICE)    Imaging Review No results found.   MDM   1. Acute  upper respiratory infections of unspecified site    Rapid strep negative.  Discussed likely viral in etiology.  Given HO with list of symptomatic things to do to control symptoms.  Highlighted. Vit C and Zinc.  mucinex D 1-2 capsules twice daily.  flonase 2 sprays each nostril.  If not improving in 3-5 days can start zpak. Diflucan given for yeast infection prevention after abx.  Follow up with PCP as needed.     [name], PA-C 03/03/14 1005

## 2014-03-03 NOTE — ED Provider Notes (Signed)
Agree with exam, assessment, and plan.   Kandra Nicolas, MD 03/03/14 [phone]

## 2014-03-03 NOTE — Discharge Instructions (Signed)
Vitamin C and Zinc.  mucinex D 1 tablet twice a day.  flonase 2 sprays each nostril.   Upper Respiratory Infection, Adult An upper respiratory infection (URI) is also known as the common cold. It is often caused by a type of germ (virus). Colds are easily spread (contagious). You can pass it to others by kissing, coughing, sneezing, or drinking out of the same glass. Usually, you get better in 1 or 2 weeks.  HOME CARE   Only take medicine as told by your doctor.  Use a warm mist humidifier or breathe in steam from a hot shower.  Drink enough water and fluids to keep your pee (urine) clear or pale yellow.  Get plenty of rest.  Return to work when your temperature is back to normal or as told by your doctor. You may use a face mask and wash your hands to stop your cold from spreading. GET HELP RIGHT AWAY IF:   After the first few days, you feel you are getting worse.  You have questions about your medicine.  You have chills, shortness of breath, or brown or red spit (mucus).  You have yellow or brown snot (nasal discharge) or pain in the face, especially when you bend forward.  You have a fever, puffy (swollen) neck, pain when you swallow, or white spots in the back of your throat.  You have a bad headache, ear pain, sinus pain, or chest pain.  You have a high-pitched whistling sound when you breathe in and out (wheezing).  You have a lasting cough or cough up blood.  You have sore muscles or a stiff neck. MAKE SURE YOU:   Understand these instructions.  Will watch your condition.  Will get help right away if you are not doing well or get worse. Document Released: 11/15/2007 Document Revised: 08/21/2011 Document Reviewed: 09/03/2013 [facility] Patient Information 2015 [place], [place]. This information is not intended to replace advice given to you by your health care provider. Make sure you discuss any questions you have with your health care provider.

## 2014-03-06 ENCOUNTER — Other Ambulatory Visit: Payer: Self-pay | Admitting: Physician Assistant

## 2014-03-06 ENCOUNTER — Telehealth: Payer: Self-pay | Admitting: *Deleted

## 2014-03-06 MED ORDER — HYDROCOD POLST-CHLORPHEN POLST 10-8 MG/5ML PO LQCR: 5.0000 mL | Freq: Two times a day (BID) | ORAL | Status: DC | PRN

## 2014-03-06 NOTE — Telephone Encounter (Signed)
Come pick up cough syrup.

## 2014-03-06 NOTE — Telephone Encounter (Signed)
Pt notified to come pick up rx.

## 2014-03-06 NOTE — Telephone Encounter (Signed)
Pt left vm stating that she can not get rid of her cough & wants to know if you could give her something.  She said that she'd tried tessalon perles before and they didn't help.

## 2014-03-30 ENCOUNTER — Encounter: Payer: Self-pay | Admitting: Physician Assistant

## 2014-03-30 DIAGNOSIS — K5090 Crohn's disease, unspecified, without complications: Secondary | ICD-10-CM

## 2014-03-30 DIAGNOSIS — K5010 Crohn's disease of large intestine without complications: Secondary | ICD-10-CM | POA: Insufficient documentation

## 2014-03-30 HISTORY — DX: Crohn's disease, unspecified, without complications: K50.90

## 2014-04-05 ENCOUNTER — Encounter: Payer: Self-pay | Admitting: Emergency Medicine

## 2014-04-05 ENCOUNTER — Emergency Department
Admission: EM | Admit: 2014-04-05 | Discharge: 2014-04-05 | Disposition: A | Discharge status: Home / Self Care | Payer: BC Managed Care – PPO | Source: Home / Self Care | Attending: Family Medicine | Admitting: Family Medicine

## 2014-04-05 DIAGNOSIS — K5090 Crohn's disease, unspecified, without complications: Secondary | ICD-10-CM

## 2014-04-05 DIAGNOSIS — R197 Diarrhea, unspecified: Secondary | ICD-10-CM

## 2014-04-05 MED ORDER — PREDNISONE 50 MG PO TABS: 50.0000 mg | ORAL_TABLET | Freq: Every day | ORAL | Status: DC

## 2014-04-05 NOTE — ED Provider Notes (Signed)
Stacie Cardenas is a 33 y.o. female who presents to Urgent Care today for diarrhea and abdominal cramping. Symptoms present for 2 days. Patient was recently diagnosed with Crohn's disease and is currently on mesalamine. She has been taking Bentyl and Lomotil for diarrhea which has not helped. She denies any frank abdominal pain. She has an appointment with her gastroenterologist tomorrow. Nobody else is sick. No fevers or chills.   Past Medical History  Diagnosis Date  . Hypertension   . Hypercholesterolemia   . Other specified iron deficiency anemias 03/17/2013  . IBS (irritable bowel syndrome)    History  Substance Use Topics  . Smoking status: Never Smoker   . Smokeless tobacco: Never Used     Comment: never used tobacco  . Alcohol Use: No   ROS as above Medications: No current facility-administered medications for this encounter.   Current Outpatient Prescriptions  Medication Sig Dispense Refill  . Adapalene-Benzoyl Peroxide 0.1-2.5 % gel Apply 1 application topically every morning.      Marland Kitchen azithromycin (ZITHROMAX) 250 MG tablet Take 2 tablets now and then one tablet for 4 days.  6 each  0  . cetirizine-pseudoephedrine (ZYRTEC-D) 5-120 MG per tablet Take 1 tablet by mouth as needed.      . chlorpheniramine-HYDROcodone (TUSSIONEX) 10-8 MG/5ML LQCR Take 5 mLs by mouth every 12 (twelve) hours as needed for cough (cough, will cause drowsiness.).  120 mL  0  . dicyclomine (BENTYL) 10 MG capsule Take 1 capsule (10 mg total) by mouth 2 (two) times daily.  60 capsule  0  . diphenoxylate-atropine (LOMOTIL) 2.5-0.025 MG per tablet Take 2 tablets by mouth 4 (four) times daily as needed for diarrhea or loose stools.  30 tablet  0  . fluconazole (DIFLUCAN) 150 MG tablet Take 1 tablet (150 mg total) by mouth once. At onset of symptoms and then once when finished with antibiotic.  2 tablet  0  . fluticasone (FLONASE) 50 MCG/ACT nasal spray Place 2 sprays into the nose as needed.      . ondansetron  (ZOFRAN) 4 MG tablet Take 4 mg by mouth as needed for nausea.      . predniSONE (DELTASONE) 50 MG tablet Take 1 tablet (50 mg total) by mouth daily.  10 tablet  0  . simvastatin (ZOCOR) 40 MG tablet Take 1 tablet (40 mg total) by mouth every evening.  30 tablet  11  . SUMAtriptan (IMITREX) 100 MG tablet Take 100 mg by mouth as needed for migraine.      Marland Kitchen telmisartan-hydrochlorothiazide (MICARDIS HCT) 80-12.5 MG per tablet Take 1 tablet by mouth daily.  30 tablet  6    Exam:  BP 126/90  Pulse 81  Temp(Src) 99.6 F (37.6 C) (Oral)  Resp 16  Ht 5' 4"  (1.626 m)  Wt 212 lb (96.163 kg)  BMI 36.37 kg/m2  SpO2 100%  LMP 03/20/2014 Gen: Well NAD HEENT: ,  MMM Lungs: Normal work of breathing. CTABL Heart: RRR no MRG Abd: NABS, Soft. Nondistended, Nontender no rebound or guarding Exts: Brisk capillary refill, warm and well perfused.   No results found for this or any previous visit (from the past 24 hour(s)). No results found.  Assessment and Plan: 33 y.o. female with Crohn's flare. Treatment with prednisone. Follow-up with gastroenterologist.  Discussed warning signs or symptoms. Please see discharge instructions. Patient expresses understanding.     Gregor Hams, MD 04/05/14 [phone]

## 2014-04-05 NOTE — ED Notes (Signed)
Diarrhea and nausea since Friday morning.  Taking Immodium AD and Bentyl without relief.

## 2014-04-05 NOTE — Discharge Instructions (Signed)
Thank you for coming in today. Take prednisone daily Follow-up with your gastroenterologist on Monday or Tuesday Go to the emergency room if you get worse If your belly pain worsens, or you have high fever, bad vomiting, blood in your stool or black tarry stool go to the Emergency Room.   Crohn Disease Crohn disease is a long-term (chronic) soreness and redness (inflammation) of the intestines (bowel). It can affect any portion of the digestive tract, from the mouth to the anus. It can also cause problems outside the digestive tract. Crohn disease is closely related to a disease called ulcerative colitis (together, these two diseases are called inflammatory bowel disease).  CAUSES  The cause of Crohn disease is not known. One Link Snuffer is that, in an easily affected person, the immune system is triggered to attack the body's own digestive tissue. Crohn disease runs in families. It seems to be more common in certain geographic areas and amongst certain races. There are no clear-cut dietary causes.  SYMPTOMS  Crohn disease can cause many different symptoms since it can affect many different parts of the body. Symptoms include:  Fatigue.  Weight loss.  Chronic diarrhea, sometime bloody.  Abdominal pain and cramps.  Fever.  Ulcers or canker sores in the mouth or rectum.  Anemia (low red blood cells).  Arthritis, skin problems, and eye problems may occur. Complications of Crohn disease can include:  Series of holes (perforation) of the bowel.  Portions of the intestines sticking to each other (adhesions).  Obstruction of the bowel.  Fistula formation, typically in the rectal area but also in other areas. A fistula is an opening between the bowels and the outside, or between the bowels and another organ.  A painful crack in the mucous membrane of the anus (rectal fissure). DIAGNOSIS  Your caregiver may suspect Crohn disease based on your symptoms and an exam. Blood tests may confirm  that there is a problem. You may be asked to submit a stool specimen for examination. X-rays and CT scans may be necessary. Ultimately, the diagnosis is usually made after a procedure that uses a flexible tube that is inserted via your mouth or your anus. This is done under sedation and is called either an upper endoscopy or colonoscopy. With these tests, the specialist can take tiny tissue samples and remove them from the inside of the bowel (biopsy). Examination of this biopsy tissue under a microscope can reveal Crohn disease as the cause of your symptoms. Due to the many different forms that Crohn disease can take, symptoms may be present for several years before a diagnosis is made. TREATMENT  Medications are often used to decrease inflammation and control the immune system. These include medicines related to aspirin, steroid medications, and newer and stronger medications to slow down the immune system. Some medications may be used as suppositories or enemas. A number of other medications are used or have been studied. Your caregiver will make specific recommendations. HOME CARE INSTRUCTIONS   Symptoms such as diarrhea can be controlled with medications. Avoid foods that have a laxative effect such as fresh fruit, vegetables, and dairy products. During flare-ups, you can rest your bowel by refraining from solid foods. Drink clear liquids frequently during the day. (Electrolyte or rehydrating fluids are best. Your caregiver can help you with suggestions.) Drink often to prevent loss of body fluids (dehydration). When diarrhea has cleared, eat small meals and more frequently. Avoid food additives and stimulants such as caffeine (coffee, tea, or chocolate). Enzyme  supplements may help if you develop intolerance to a sugar in dairy products (lactose). Ask your caregiver or dietitian about specific dietary instructions.  Try to maintain a positive attitude. Learn relaxation techniques such as self-hypnosis,  mental imaging, and muscle relaxation.  If possible, avoid stresses which can aggravate your condition.  Exercise regularly.  Follow your diet.  Always get plenty of rest. SEEK MEDICAL CARE IF:   Your symptoms fail to improve after a week or two of new treatment.  You experience continued weight loss.  You have ongoing cramps or loose bowels.  You develop a new skin rash, skin sores, or eye problems. SEEK IMMEDIATE MEDICAL CARE IF:   You have worsening of your symptoms or develop new symptoms.  You have a fever.  You develop bloody diarrhea.  You develop severe abdominal pain. MAKE SURE YOU:   Understand these instructions.  Will watch your condition.  Will get help right away if you are not doing well or get worse. Document Released: 03/08/2005 Document Revised: 10/13/2013 Document Reviewed: 02/04/2007 [facility] Patient Information 2015 [place], [place]. This information is not intended to replace advice given to you by your health care provider. Make sure you discuss any questions you have with your health care provider.

## 2014-04-13 ENCOUNTER — Emergency Department
Admission: EM | Admit: 2014-04-13 | Discharge: 2014-04-13 | Disposition: A | Discharge status: Home / Self Care | Payer: BC Managed Care – PPO | Source: Home / Self Care | Attending: Family Medicine | Admitting: Family Medicine

## 2014-04-13 ENCOUNTER — Encounter: Payer: Self-pay | Admitting: *Deleted

## 2014-04-13 ENCOUNTER — Emergency Department (INDEPENDENT_AMBULATORY_CARE_PROVIDER_SITE_OTHER): Payer: BC Managed Care – PPO

## 2014-04-13 DIAGNOSIS — R69 Illness, unspecified: Principal | ICD-10-CM

## 2014-04-13 DIAGNOSIS — R509 Fever, unspecified: Secondary | ICD-10-CM

## 2014-04-13 DIAGNOSIS — R079 Chest pain, unspecified: Secondary | ICD-10-CM

## 2014-04-13 DIAGNOSIS — R197 Diarrhea, unspecified: Secondary | ICD-10-CM

## 2014-04-13 DIAGNOSIS — J111 Influenza due to unidentified influenza virus with other respiratory manifestations: Secondary | ICD-10-CM

## 2014-04-13 LAB — POCT CBC W AUTO DIFF (K'VILLE URGENT CARE)

## 2014-04-13 LAB — POCT INFLUENZA A/B
INFLUENZA A, POC: NEGATIVE
INFLUENZA B, POC: NEGATIVE

## 2014-04-13 LAB — POCT RAPID STREP A (OFFICE): Rapid Strep A Screen: NEGATIVE

## 2014-04-13 MED ORDER — SODIUM CHLORIDE 0.9 % IV BOLUS (SEPSIS)
1000.0000 mL | Freq: Once | INTRAVENOUS | Status: AC
  Administered 2014-04-13: 1000 mL via INTRAVENOUS

## 2014-04-13 MED ORDER — ACETAMINOPHEN 325 MG PO TABS
975.0000 mg | ORAL_TABLET | Freq: Once | ORAL | Status: AC
  Administered 2014-04-13: 975 mg via ORAL

## 2014-04-13 NOTE — ED Notes (Signed)
Stacie Cardenas c/o 3 days of chills/sweats, body aches, chest and back pain, HA and sore throat. She went to the ER Friday night for the CP, EKG and CXR normal.

## 2014-04-13 NOTE — ED Provider Notes (Signed)
Stacie Cardenas is a 33 y.o. female who presents to Urgent Care today for Chills sweats body aches chest and back pain headache and sore throat. Symptoms present for 3 days. Patient went to the emergency department on Friday night where EKG and chest x-ray were normal. She denies any nausea vomiting but does note continued diarrhea. She recently had medication changes for Crohn's disease. Her Lialda dose was increased and she was started on Uceris. [name] tried Tylenol for her symptoms which help a bit. Her last dose was yesterday.   Past Medical History  Diagnosis Date  . Hypertension   . Hypercholesterolemia   . Other specified iron deficiency anemias 03/17/2013  . IBS (irritable bowel syndrome)    Past Surgical History  Procedure Laterality Date  . Cesarean section     History  Substance Use Topics  . Smoking status: Never Smoker   . Smokeless tobacco: Never Used     Comment: never used tobacco  . Alcohol Use: No   ROS as above Medications: No current facility-administered medications for this encounter.   Current Outpatient Prescriptions  Medication Sig Dispense Refill  . Budesonide (UCERIS) 9 MG TB24 Take by mouth.    . mesalamine (LIALDA) 1.2 G EC tablet Take 1.2 g by mouth daily with breakfast.    . Adapalene-Benzoyl Peroxide 0.1-2.5 % gel Apply 1 application topically every morning.    . dicyclomine (BENTYL) 10 MG capsule Take 1 capsule (10 mg total) by mouth 2 (two) times daily. 60 capsule 0  . diphenoxylate-atropine (LOMOTIL) 2.5-0.025 MG per tablet Take 2 tablets by mouth 4 (four) times daily as needed for diarrhea or loose stools. 30 tablet 0  . fluticasone (FLONASE) 50 MCG/ACT nasal spray Place 2 sprays into the nose as needed.    . ondansetron (ZOFRAN) 4 MG tablet Take 4 mg by mouth as needed for nausea.    . predniSONE (DELTASONE) 50 MG tablet Take 1 tablet (50 mg total) by mouth daily. 10 tablet 0  . simvastatin (ZOCOR) 40 MG tablet Take 1 tablet (40 mg total) by mouth  every evening. 30 tablet 11  . SUMAtriptan (IMITREX) 100 MG tablet Take 100 mg by mouth as needed for migraine.    Marland Kitchen telmisartan-hydrochlorothiazide (MICARDIS HCT) 80-12.5 MG per tablet Take 1 tablet by mouth daily. 30 tablet 6   Allergies  Allergen Reactions  . Ampicillin Diarrhea     Exam:  BP 89/60 mmHg  Pulse 98  Temp(Src) 98.7 F (37.1 C) (Oral)  Resp 18  Wt 209 lb (94.802 kg)  SpO2 97%  LMP 03/20/2014 Gen: Well NAD nontoxic appearing HEENT: EOMI,  MMM posterior pharynx is erythematous. Tympanic membranes are normal appearing bilaterally. Lungs: Normal work of breathing. CTABL Heart: tachycardia but regular no MRG Abd: NABS, Soft. Nondistended, Nontender Exts: Brisk capillary refill, warm and well perfused.   Patient was given a 1 L normal saline bolus and 975 mg of Tylenol and felt much better. Prior to discharge she was able to stand and ambulate normally and denies any dizziness or lightheadedness.  12 lead EKG: sinus tachycardia at 108 bpm. Flattened lateral precordial T waves. No ST segment elevation or depression. No Q waves present.  CBC: white blood cell count 10.7, hemoglobin 12.1, platelets 300  Results for orders placed or performed during the hospital encounter of 04/13/14 (from the past 24 hour(s))  POCT rapid strep A     Status: None   Collection Time: 04/13/14 11:47 AM  Result Value Ref  Range   Rapid Strep A Screen Negative Negative  POCT Influenza A/B     Status: None   Collection Time: 04/13/14 11:47 AM  Result Value Ref Range   Influenza A, POC Negative    Influenza B, POC Negative   POCT CBC w auto diff ([facility])     Status: None   Collection Time: 04/13/14 12:46 PM  Result Value Ref Range   WBC  4.5 - 10.5 K/uL   Lymphocytes relative %  15 - 45 %   Monocytes relative %  2 - 10 %   Neutrophils relative % (GR)  44 - 76 %   Lymphocytes absolute  0.1 - 1.8 K/uL   Monocyes absolute  0.1 - 1 K/uL   Neutrophils absolute (GR#)  1.7 - 7.8  K/uL   RBC  3.8 - 5.1 MIL/uL   Hemoglobin  11.8 - 15.5 g/dL   Hematocrit  34.8 - 46 %   MCV  78 - 100 fL   MCH  26 - 32 pg   MCHC  32 - 36.5 g/dL   RDW  11.6 - 14 %   Platelet count  140 - 400 K/uL   MPV  7.8 - 11 fL   Dg Chest 2 View  04/13/2014   CLINICAL DATA:  Fever  EXAM: CHEST  2 VIEW  COMPARISON:  None.  FINDINGS: Lungs are clear. Heart size and pulmonary vascularity are normal. No adenopathy. No bone lesions.  IMPRESSION: No edema or consolidation.   Electronically Signed   By: Lowella Grip M.D.   On: 04/13/2014 12:50    Assessment and Plan: 33 y.o. female with influenza-like illness. Plan for symptomatically management with Tylenol ibuprofen and fluids. Follow up with PCP as needed.  Discussed warning signs or symptoms. Please see discharge instructions. Patient expresses understanding.     Gregor Hams, MD 04/13/14 [phone]

## 2014-04-13 NOTE — Discharge Instructions (Signed)
Thank you for coming in today. Take ibuprofen or Tylenol as needed for pain and fever Follow-up with primary care doctor  Call or go to the emergency room if you get worse, have trouble breathing, have chest pains, or palpitations.   Influenza Influenza ("the flu") is a viral infection of the respiratory tract. It occurs more often in winter months because people spend more time in close contact with one another. Influenza can make you feel very sick. Influenza easily spreads from person to person (contagious). CAUSES  Influenza is caused by a virus that infects the respiratory tract. You can catch the virus by breathing in droplets from an infected person's cough or sneeze. You can also catch the virus by touching something that was recently contaminated with the virus and then touching your mouth, nose, or eyes. RISKS AND COMPLICATIONS You may be at risk for a more severe case of influenza if you smoke cigarettes, have diabetes, have chronic heart disease (such as heart failure) or lung disease (such as asthma), or if you have a weakened immune system. Elderly people and pregnant women are also at risk for more serious infections. The most common problem of influenza is a lung infection (pneumonia). Sometimes, this problem can require emergency medical care and may be life threatening. SIGNS AND SYMPTOMS  Symptoms typically last 4 to 10 days and may include:  Fever.  Chills.  Headache, body aches, and muscle aches.  Sore throat.  Chest discomfort and cough.  Poor appetite.  Weakness or feeling tired.  Dizziness.  Nausea or vomiting. DIAGNOSIS  Diagnosis of influenza is often made based on your history and a physical exam. A nose or throat swab test can be done to confirm the diagnosis. TREATMENT  In mild cases, influenza goes away on its own. Treatment is directed at relieving symptoms. For more severe cases, your health care provider may prescribe antiviral medicines to shorten  the sickness. Antibiotic medicines are not effective because the infection is caused by a virus, not by bacteria. HOME CARE INSTRUCTIONS  Take medicines only as directed by your health care provider.  Use a cool mist humidifier to make breathing easier.  Get plenty of rest until your temperature returns to normal. This usually takes 3 to 4 days.  Drink enough fluid to keep your urine clear or pale yellow.  Cover yourmouth and nosewhen coughing or sneezing,and wash your handswellto prevent thevirusfrom spreading.  Stay homefromwork orschool untilthe fever is gonefor at least 48fll day. PREVENTION  An annual influenza vaccination (flu shot) is the best way to avoid getting influenza. An annual flu shot is now routinely recommended for all adults in the [place]:  You experiencechest pain, yourcough worsens,or you producemore mucus.  Youhave nausea,vomiting, ordiarrhea.  Your fever returns or gets worse. SEEK IMMEDIATE MEDICAL CARE IF:  You havetrouble breathing, you become short of breath,or your skin ornails becomebluish.  You have severe painor stiffnessin the neck.  You develop a sudden headache, or pain in the face or ear.  You have nausea or vomiting that you cannot control. MAKE SURE YOU:   Understand these instructions.  Will watch your condition.  Will get help right away if you are not doing well or get worse. Document Released: 05/26/2000 Document Revised: 10/13/2013 Document Reviewed: 08/28/2011 [facility]Patient Information 2015 [place] This information is not intended to replace advice given to you by your health care provider. Make sure you discuss any questions you have with your health  care provider.

## 2014-04-17 ENCOUNTER — Other Ambulatory Visit: Payer: BC Managed Care – PPO | Admitting: Lab

## 2014-04-17 ENCOUNTER — Ambulatory Visit: Payer: BC Managed Care – PPO | Admitting: Hematology & Oncology

## 2014-04-22 ENCOUNTER — Other Ambulatory Visit: Payer: Self-pay | Admitting: Family Medicine

## 2014-05-06 ENCOUNTER — Encounter: Payer: Self-pay | Admitting: Physician Assistant

## 2014-05-06 ENCOUNTER — Ambulatory Visit (INDEPENDENT_AMBULATORY_CARE_PROVIDER_SITE_OTHER): Payer: BC Managed Care – PPO | Admitting: Physician Assistant

## 2014-05-06 VITALS — BP 128/81 | HR 92 | Temp 97.9°F | Wt 200.0 lb

## 2014-05-06 DIAGNOSIS — R7301 Impaired fasting glucose: Secondary | ICD-10-CM

## 2014-05-06 DIAGNOSIS — R7309 Other abnormal glucose: Secondary | ICD-10-CM

## 2014-05-06 DIAGNOSIS — K5090 Crohn's disease, unspecified, without complications: Secondary | ICD-10-CM | POA: Diagnosis not present

## 2014-05-06 LAB — POCT GLYCOSYLATED HEMOGLOBIN (HGB A1C): HEMOGLOBIN A1C: 8.7

## 2014-05-06 LAB — POCT UA - MICROALBUMIN
Albumin/Creatinine Ratio, Urine, POC: <30
CREATININE, POC: 50 mg/dL
MICROALBUMIN (UR) POC: 10 mg/L

## 2014-05-06 MED ORDER — DAPAGLIFLOZIN PRO-METFORMIN ER 5-1000 MG PO TB24: 1.0000 | ORAL_TABLET | Freq: Every morning | ORAL | Status: DC

## 2014-05-06 NOTE — Patient Instructions (Addendum)
Need to make appt with hematologist for infusion.  Follow up with [facility].  Take 1 tablet tomorrow of prednisone, Monday 1/2 tablet for 3 days. Then stop.   Recheck A1C.

## 2014-05-06 NOTE — Progress Notes (Signed)
   Subjective:    Patient ID: Stacie Cardenas, female    DOB: [birth year], 33 y.o.   MRN: [id number]  HPI  Pt presents to the clinic after being discharged from hospital on 11/9 at [facility] for chrons complications/flare. She has been on prednisone for over a month and currently on a taper. She having diabetic symptoms such as increased thirst and increased urinary frequency.     Review of Systems  All other systems reviewed and are negative.      Objective:   Physical Exam  Constitutional: She is oriented to person, place, and time. She appears well-developed and well-nourished.  HENT:  Head: Normocephalic and atraumatic.  Cardiovascular: Normal rate, regular rhythm and normal heart sounds.   Pulmonary/Chest: Effort normal and breath sounds normal. She has no wheezes.  Abdominal: Soft. Bowel sounds are normal. She exhibits no distension and no mass. There is no tenderness. There is no rebound and no guarding.  Neurological: She is alert and oriented to person, place, and time.  Skin: Skin is dry.  Psychiatric: She has a normal mood and affect. Her behavior is normal.          Assessment & Plan:  chrons disease- follow up with digestive health and find out what to do during flares and appropriate hospitals to go to.   Elevated A1C- .Marland Kitchen Results for orders placed or performed in visit on 05/06/14  POCT glycosylated hemoglobin (Hb A1C)  Result Value Ref Range   Hemoglobin A1C 8.7   POCT UA - Microalbumin  Result Value Ref Range   Microalbumin Ur, POC 10 mg/L   Creatinine, POC 50 mg/dL   Albumin/Creatinine Ratio, Urine, POC <40    Certainly could be elevated due to prednisone usage. Would still like to start medication xigduo started. Side effects discussed. Follow up in 3 months. Pt is tapering off of prednisone currently. Discussed diet and weight control as well.

## 2014-05-11 DIAGNOSIS — R7309 Other abnormal glucose: Secondary | ICD-10-CM | POA: Insufficient documentation

## 2014-05-18 ENCOUNTER — Telehealth: Payer: Self-pay | Admitting: Hematology & Oncology

## 2014-05-18 ENCOUNTER — Telehealth: Payer: Self-pay

## 2014-05-18 NOTE — Telephone Encounter (Signed)
Received call from pt requesting she be seen earlier "because I know I need iron." Informed pt that she could be scheduled earlier for lab, then we can proceed with iron infusion as indicated. Pt verbalizes understanding & was transferred to scheduler. dph

## 2014-05-18 NOTE — Telephone Encounter (Signed)
Pt made 12-23 from cx 11-5 said she was in the hospital. Transferred to RN for triage

## 2014-05-19 ENCOUNTER — Other Ambulatory Visit (HOSPITAL_BASED_OUTPATIENT_CLINIC_OR_DEPARTMENT_OTHER): Payer: BC Managed Care – PPO | Admitting: Lab

## 2014-05-19 DIAGNOSIS — D509 Iron deficiency anemia, unspecified: Secondary | ICD-10-CM

## 2014-05-19 LAB — IRON AND TIBC CHCC
%SAT: 24 % (ref 21–57)
IRON: 44 ug/dL (ref 41–142)
TIBC: 181 ug/dL — ABNORMAL LOW (ref 236–444)
UIBC: 137 ug/dL (ref 120–384)

## 2014-05-19 LAB — CBC WITH DIFFERENTIAL (CANCER CENTER ONLY)
BASO#: 0 10*3/uL (ref 0.0–0.2)
BASO%: 0.2 % (ref 0.0–2.0)
EOS ABS: 0 10*3/uL (ref 0.0–0.5)
EOS%: 0.8 % (ref 0.0–7.0)
HCT: 37.3 % (ref 34.8–46.6)
HGB: 12.2 g/dL (ref 11.6–15.9)
LYMPH#: 2.6 10*3/uL (ref 0.9–3.3)
LYMPH%: 52.4 % — ABNORMAL HIGH (ref 14.0–48.0)
MCH: 29.9 pg (ref 26.0–34.0)
MCHC: 32.7 g/dL (ref 32.0–36.0)
MCV: 91 fL (ref 81–101)
MONO#: 0.9 10*3/uL (ref 0.1–0.9)
MONO%: 18.2 % — AB (ref 0.0–13.0)
NEUT%: 28.4 % — ABNORMAL LOW (ref 39.6–80.0)
NEUTROS ABS: 1.4 10*3/uL — AB (ref 1.5–6.5)
Platelets: 251 10*3/uL (ref 145–400)
RBC: 4.08 10*6/uL (ref 3.70–5.32)
RDW: 12.6 % (ref 11.1–15.7)
WBC: 4.9 10*3/uL (ref 3.9–10.0)

## 2014-05-19 LAB — TECHNOLOGIST REVIEW CHCC SATELLITE

## 2014-05-19 LAB — RETICULOCYTES (CHCC)
ABS Retic: 124.5 10*3/uL (ref 19.0–186.0)
RBC.: 4.15 MIL/uL (ref 3.87–5.11)
RETIC CT PCT: 3 % — AB (ref 0.4–2.3)

## 2014-05-19 LAB — FERRITIN CHCC: Ferritin: 399 ng/ml — ABNORMAL HIGH (ref 9–269)

## 2014-05-22 ENCOUNTER — Other Ambulatory Visit: Payer: Self-pay | Admitting: *Deleted

## 2014-05-22 MED ORDER — FLUCONAZOLE 150 MG PO TABS: 150.0000 mg | ORAL_TABLET | Freq: Once | ORAL | Status: DC

## 2014-06-02 ENCOUNTER — Other Ambulatory Visit: Payer: Self-pay | Admitting: *Deleted

## 2014-06-02 DIAGNOSIS — D509 Iron deficiency anemia, unspecified: Secondary | ICD-10-CM

## 2014-06-03 ENCOUNTER — Ambulatory Visit (INDEPENDENT_AMBULATORY_CARE_PROVIDER_SITE_OTHER): Payer: BC Managed Care – PPO | Admitting: Physician Assistant

## 2014-06-03 ENCOUNTER — Encounter: Payer: Self-pay | Admitting: Physician Assistant

## 2014-06-03 ENCOUNTER — Ambulatory Visit (HOSPITAL_BASED_OUTPATIENT_CLINIC_OR_DEPARTMENT_OTHER): Payer: BC Managed Care – PPO | Admitting: Hematology & Oncology

## 2014-06-03 ENCOUNTER — Ambulatory Visit (HOSPITAL_BASED_OUTPATIENT_CLINIC_OR_DEPARTMENT_OTHER): Payer: BC Managed Care – PPO

## 2014-06-03 ENCOUNTER — Encounter: Payer: Self-pay | Admitting: Hematology & Oncology

## 2014-06-03 ENCOUNTER — Ambulatory Visit (HOSPITAL_BASED_OUTPATIENT_CLINIC_OR_DEPARTMENT_OTHER): Payer: BC Managed Care – PPO | Admitting: Lab

## 2014-06-03 VITALS — BP 137/99 | HR 96 | Ht 64.0 in | Wt 197.0 lb

## 2014-06-03 VITALS — BP 134/83 | HR 97 | Temp 98.1°F | Resp 18 | Wt 197.0 lb

## 2014-06-03 DIAGNOSIS — K5090 Crohn's disease, unspecified, without complications: Secondary | ICD-10-CM

## 2014-06-03 DIAGNOSIS — L0390 Cellulitis, unspecified: Secondary | ICD-10-CM

## 2014-06-03 DIAGNOSIS — D509 Iron deficiency anemia, unspecified: Secondary | ICD-10-CM

## 2014-06-03 DIAGNOSIS — L0291 Cutaneous abscess, unspecified: Secondary | ICD-10-CM | POA: Diagnosis not present

## 2014-06-03 DIAGNOSIS — D508 Other iron deficiency anemias: Secondary | ICD-10-CM

## 2014-06-03 MED ORDER — HYDROCODONE-ACETAMINOPHEN 5-325 MG PO TABS: 1.0000 | ORAL_TABLET | Freq: Three times a day (TID) | ORAL | Status: DC | PRN

## 2014-06-03 MED ORDER — DOXYCYCLINE HYCLATE 100 MG PO TABS: 100.0000 mg | ORAL_TABLET | Freq: Two times a day (BID) | ORAL | Status: DC

## 2014-06-03 MED ORDER — FLUCONAZOLE 150 MG PO TABS: 150.0000 mg | ORAL_TABLET | Freq: Every day | ORAL | Status: DC

## 2014-06-03 MED ORDER — SODIUM CHLORIDE 0.9 % IV SOLN
510.0000 mg | Freq: Once | INTRAVENOUS | Status: AC
  Administered 2014-06-03: 510 mg via INTRAVENOUS
  Filled 2014-06-03: qty 17

## 2014-06-03 NOTE — Progress Notes (Signed)
   Subjective:    Patient ID: Stacie Cardenas, female    DOB: 05/07/1981, 33 y.o.   MRN: [id number]  HPI   Patient is a 33 year old female who comes in today with a bump on her vuvla. She has a history of abscesses that we have drained before. This abscess has been performing for the last 2-3 days. It has increasingly become more painful. She has tried warm compresses but cannot seem to make it drain. She denies any fever or chills. [name] having problems walking currently. Due to discomfort.    Review of Systems  All other systems reviewed and are negative.      Objective:   Physical Exam  Constitutional: She is oriented to person, place, and time. She appears well-developed and well-nourished.  Pulmonary/Chest: Effort normal and breath sounds normal.  Genitourinary:     Neurological: She is alert and oriented to person, place, and time.  Skin: Skin is dry.  Psychiatric: She has a normal mood and affect. Her behavior is normal.          Assessment & Plan:  Abscess- will I and D today. Placed on doxy for 10 days. Diflucan for yeast infection. Warning signs of infection discussed. Follow up on 12/28.   Incision and Drainage Procedure Note  Pre-operative Diagnosis: abscess  Post-operative Diagnosis: same  Indications: pain  Anesthesia: 1% plain lidocaine  Procedure Details  The procedure, risks and complications have been discussed in detail (including, but not limited to airway compromise, infection, bleeding) with the patient, and the patient has signed consent to the procedure.  The skin was sterilely prepped and draped over the affected area in the usual fashion. After adequate local anesthesia, I&D with a #11 blade was performed on the left vuvla. Purulent drainage: present The patient was observed until stable.  Findings: Purulent drainage  EBL: scant   Packing placed for healing purposes  Condition: Tolerated procedure well   Complications: pain.

## 2014-06-03 NOTE — Progress Notes (Signed)
Hematology and Oncology Follow Up Visit  Stacie Cardenas [id number] [birth year] 33 y.o. 06/03/2014   Principle Diagnosis:   Iron deficiency anemia  Crohn's disease  Menometrorrhagia  Current Therapy:    IV iron as indicated-patient to receive a dose today.     Interim History:  Ms.  Cardenas is back for followup. She unfortunately, has been found to have Crohn's disease. She was found have this with a colonoscopy. She was having diarrhea for several months. I am not sure how bad the Crohn's diseases. She probably had been on some treatment for it. She has not received any monoclonal antibody for it.  She does feel tired. She had lab work done a couple weeks ago. Her iron was started go down. Her total iron was 44. Her iron saturation was down to 24%. She has an elevated ferritin, because of the Crohn's disease.  [name] had the monthly cycles. These are about the same.  [name] had no cough. [name] had some weight loss. I think she is lost about 20 pounds because of the diarrhea due to Crohn's disease.  Overall, her performance status is ECOG 1.         Medications: Current outpatient prescriptions: Adapalene-Benzoyl Peroxide 0.1-2.5 % gel, Apply 1 application topically every morning., Disp: , Rfl: ;  budesonide (ENTOCORT EC) 3 MG 24 hr capsule, , Disp: , Rfl: 4;  dicyclomine (BENTYL) 10 MG capsule, Take 1 capsule (10 mg total) by mouth 2 (two) times daily., Disp: 60 capsule, Rfl: 0 diphenoxylate-atropine (LOMOTIL) 2.5-0.025 MG per tablet, Take 2 tablets by mouth 4 times a day as needed for diarrhea or loose stools, Disp: 30 tablet, Rfl: 0;  fluticasone (FLONASE) 50 MCG/ACT nasal spray, Place 2 sprays into the nose as needed., Disp: , Rfl: ;  ondansetron (ZOFRAN) 4 MG tablet, Take 4 mg by mouth as needed for nausea., Disp: , Rfl:  simvastatin (ZOCOR) 40 MG tablet, Take 1 tablet (40 mg total) by mouth every evening., Disp: 30 tablet, Rfl: 11;  SUMAtriptan (IMITREX) 100 MG tablet, Take 100 mg  by mouth as needed for migraine., Disp: , Rfl: ;  telmisartan-hydrochlorothiazide (MICARDIS HCT) 80-12.5 MG per tablet, Take 1 tablet by mouth daily., Disp: 30 tablet, Rfl: 6 Dapagliflozin-Metformin HCl ER (XIGDUO XR) 10-998 MG TB24, Take 1 tablet by mouth every morning. (Patient not taking: Reported on 06/03/2014), Disp: 30 tablet, Rfl: 5;  mesalamine (LIALDA) 1.2 G EC tablet, Take 1.2 g by mouth daily with breakfast., Disp: , Rfl: ;  predniSONE (DELTASONE) 10 MG tablet, , Disp: , Rfl: 0  Allergies:  Allergies  Allergen Reactions  . Ampicillin Diarrhea    Past Medical History, Surgical history, Social history, and Family History were reviewed and updated.  Review of Systems: As above  Physical Exam:  weight is 197 lb (89.359 kg). Her oral temperature is 98.1 F (36.7 C). Her blood pressure is 134/83 and her pulse is 97. Her respiration is 18.   Somewhat obese African-American female. Head and neck exam shows no ocular or oral lesions. There is no palpable cervical or supraclavicular adenopathy in the neck. Lungs are clear. Cardiac exam regular rate and rhythm with no murmurs, rubs or bruits. Abdomen is soft. She is somewhat obese. She is no palpable liver or spleen tip. She has some slight tenderness to palpation throughout the abdomen, mostly in the upper abdomen. Extremities shows no clubbing cyanosis or edema. Neurological exam is nonfocal. Skin exam is without rashes. Lymph nodes are none palpable.  Lab Results  Component Value Date   WBC 4.9 05/19/2014   HGB 12.2 05/19/2014   HCT 37.3 05/19/2014   MCV 91 05/19/2014   PLT 251 05/19/2014     Chemistry      Component Value Date/Time   NA 137 11/11/2013 1040   K 4.2 11/11/2013 1040   CL 101 11/11/2013 1040   CO2 27 11/11/2013 1040   BUN 8 11/11/2013 1040   CREATININE 0.77 11/11/2013 1040   CREATININE 0.78 04/27/2010 2349      Component Value Date/Time   CALCIUM 9.5 11/11/2013 1040   ALKPHOS 73 11/11/2013 1040   AST 17  11/11/2013 1040   ALT 20 11/11/2013 1040   BILITOT 0.3 11/11/2013 1040     Ferritin is 478. Iron saturation is 50%. Total iron is 103. Reticulocyte count is 1.8%    Impression and Plan: Stacie Cardenas is a 33 year old African female. She has history of iron deficiency. She now has Crohn's disease.  We will go ahead and give her some IV iron.   We will probably get her back in about 3 months or so. I think this would be reasonable. She can back to see [place] sooner and have the laboratory done if she feels that she is having more clinical symptoms of iron deficiency.Volanda Napoleon, MD 12/23/20159:58 AM

## 2014-06-03 NOTE — Patient Instructions (Signed)

## 2014-06-03 NOTE — Patient Instructions (Signed)

## 2014-06-06 LAB — WOUND CULTURE
Gram Stain: NONE SEEN
ORGANISM ID, BACTERIA: NO GROWTH

## 2014-06-08 ENCOUNTER — Ambulatory Visit (INDEPENDENT_AMBULATORY_CARE_PROVIDER_SITE_OTHER): Payer: BC Managed Care – PPO | Admitting: Physician Assistant

## 2014-06-08 ENCOUNTER — Encounter: Payer: Self-pay | Admitting: Physician Assistant

## 2014-06-08 VITALS — BP 118/85 | HR 93 | Ht 64.0 in | Wt 193.0 lb

## 2014-06-08 DIAGNOSIS — R7309 Other abnormal glucose: Secondary | ICD-10-CM | POA: Diagnosis not present

## 2014-06-08 DIAGNOSIS — L0291 Cutaneous abscess, unspecified: Secondary | ICD-10-CM | POA: Diagnosis not present

## 2014-06-08 DIAGNOSIS — IMO0002 Reserved for concepts with insufficient information to code with codable children: Secondary | ICD-10-CM

## 2014-06-08 DIAGNOSIS — E1169 Type 2 diabetes mellitus with other specified complication: Secondary | ICD-10-CM | POA: Insufficient documentation

## 2014-06-08 DIAGNOSIS — E119 Type 2 diabetes mellitus without complications: Secondary | ICD-10-CM

## 2014-06-08 DIAGNOSIS — L0390 Cellulitis, unspecified: Secondary | ICD-10-CM | POA: Diagnosis not present

## 2014-06-08 DIAGNOSIS — E1165 Type 2 diabetes mellitus with hyperglycemia: Secondary | ICD-10-CM

## 2014-06-08 HISTORY — DX: Type 2 diabetes mellitus without complications: E11.9

## 2014-06-08 MED ORDER — AMBULATORY NON FORMULARY MEDICATION: Status: DC

## 2014-06-08 MED ORDER — SITAGLIPTIN PHOS-METFORMIN HCL 50-500 MG PO TABS: 1.0000 | ORAL_TABLET | Freq: Two times a day (BID) | ORAL | Status: DC

## 2014-06-08 MED ORDER — SIMVASTATIN 40 MG PO TABS: 40.0000 mg | ORAL_TABLET | Freq: Every evening | ORAL | Status: DC

## 2014-06-08 MED ORDER — TELMISARTAN-HCTZ 80-12.5 MG PO TABS: 1.0000 | ORAL_TABLET | Freq: Every day | ORAL | Status: DC

## 2014-06-08 NOTE — Progress Notes (Signed)
   Subjective:    Patient ID: Stacie Cardenas, female    DOB: 1980/08/17, 33 y.o.   MRN: [id number]  HPI  Pt presents to the clinic to follow up on I and d abscess. She is doing much better. On doxycycline. No pain. Packing did fall out. On menstrual cycle now. Keep area as clean and dry as possible.   She had 8.7 alc last visit. She feels like due to prednisone use. Does not have any DM symptoms at this time that she was having. Not able to tolerate xigduo. Made her have yeast infections and diarrhea. She treated twice and stopped xigduo. Not checking sugars. No hypoglycemia events.   Review of Systems  All other systems reviewed and are negative.      Objective:   Physical Exam  Constitutional: She is oriented to person, place, and time. She appears well-developed and well-nourished.  HENT:  Head: Normocephalic and atraumatic.  Cardiovascular: Normal rate, regular rhythm and normal heart sounds.   Pulmonary/Chest: Effort normal and breath sounds normal. She has no wheezes.  Genitourinary:     Neurological: She is alert and oriented to person, place, and time.  Skin: Skin is dry.  Psychiatric: She has a normal mood and affect. Her behavior is normal.          Assessment & Plan:  Abscess/cellulitis- reassurance given. Wound looks great. Keep warm compresses and finish doxycycline. Follow up as needed.   DM/elevated alC- too soon to recheck a1c. Gave glucometer to start checking glucose in morning with fasting goal of 80-120. Need to be on some medication. Samples given to check tolerability of janumet twice daily 50/500. Discussed side effects. Follow up in 2 months.

## 2014-06-09 ENCOUNTER — Other Ambulatory Visit: Payer: Self-pay | Admitting: Physician Assistant

## 2014-06-09 MED ORDER — AZATHIOPRINE 50 MG PO TABS: 50.0000 mg | ORAL_TABLET | Freq: Every day | ORAL | Status: DC

## 2014-07-20 ENCOUNTER — Other Ambulatory Visit: Payer: Self-pay | Admitting: Family

## 2014-08-10 ENCOUNTER — Ambulatory Visit: Payer: BC Managed Care – PPO | Admitting: Physician Assistant

## 2014-08-14 ENCOUNTER — Encounter: Payer: Self-pay | Admitting: Physician Assistant

## 2014-08-14 ENCOUNTER — Ambulatory Visit (INDEPENDENT_AMBULATORY_CARE_PROVIDER_SITE_OTHER): Payer: BLUE CROSS/BLUE SHIELD | Admitting: Physician Assistant

## 2014-08-14 VITALS — BP 146/95 | HR 71 | Ht 64.0 in | Wt 201.0 lb

## 2014-08-14 DIAGNOSIS — K50911 Crohn's disease, unspecified, with rectal bleeding: Secondary | ICD-10-CM

## 2014-08-14 DIAGNOSIS — E118 Type 2 diabetes mellitus with unspecified complications: Secondary | ICD-10-CM

## 2014-08-14 DIAGNOSIS — Z23 Encounter for immunization: Secondary | ICD-10-CM | POA: Diagnosis not present

## 2014-08-14 LAB — POCT GLYCOSYLATED HEMOGLOBIN (HGB A1C): Hemoglobin A1C: 6.1

## 2014-08-14 MED ORDER — AMBULATORY NON FORMULARY MEDICATION: Status: DC

## 2014-08-14 MED ORDER — METFORMIN HCL ER 500 MG PO TB24: 500.0000 mg | ORAL_TABLET | Freq: Every day | ORAL | Status: DC

## 2014-08-14 MED ORDER — DIPHENOXYLATE-ATROPINE 2.5-0.025 MG PO TABS: ORAL_TABLET | ORAL | Status: DC

## 2014-08-14 NOTE — Progress Notes (Signed)
   Subjective:    Patient ID: Stacie Cardenas, female    DOB: Oct 30, 1980, 34 y.o.   MRN: [id number]  HPI  Pt presents to the clinic to follow up on DM. Pt is checking her sugars and running 80-120 in am with just a few sugars around 130. Only taking janumet once a day due to forgetting and tolerability. No hypoglyemic events.. No concerns or problems.   Crohns- sees GI. Still having blood in stool and frequent diarrhea. Would like lomotil for only acute needs or special events where she doesn't want to have to be running to the bathroom.     Review of Systems  All other systems reviewed and are negative.      Objective:   Physical Exam  Constitutional: She is oriented to person, place, and time. She appears well-developed and well-nourished.  HENT:  Head: Normocephalic and atraumatic.  Cardiovascular: Normal rate, regular rhythm and normal heart sounds.   Pulmonary/Chest: Effort normal and breath sounds normal.  Neurological: She is alert and oriented to person, place, and time.  Skin: Skin is dry.  Psychiatric: She has a normal mood and affect. Her behavior is normal.          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 ... Lab Results  Component Value Date   HGBA1C 6.1 08/14/2014   Doing well. Pt really wants to taper off DM medication. Will drop janumet and start metformin 57m XR. Will recheck in 6 months.  Foot exam normal today.  Discussed need for eye exam.  Pneumonia 23 given today.  Test strips refilled today. Does not have to test everyday just a few times a week.   crohns- follow up with GI. rx lomotil given. Discussed only as needed. Not good to take everyday. Can do more harm than good.   Needs CPE make in August.

## 2014-08-14 NOTE — Addendum Note (Signed)
Addended by: Beatris Ship L on: 08/14/2014 09:04 AM   Modules accepted: Orders

## 2014-09-02 ENCOUNTER — Ambulatory Visit (HOSPITAL_BASED_OUTPATIENT_CLINIC_OR_DEPARTMENT_OTHER): Payer: BLUE CROSS/BLUE SHIELD | Admitting: Family

## 2014-09-02 ENCOUNTER — Telehealth: Payer: Self-pay | Admitting: *Deleted

## 2014-09-02 ENCOUNTER — Other Ambulatory Visit (HOSPITAL_BASED_OUTPATIENT_CLINIC_OR_DEPARTMENT_OTHER): Payer: BLUE CROSS/BLUE SHIELD | Admitting: Lab

## 2014-09-02 ENCOUNTER — Encounter: Payer: Self-pay | Admitting: *Deleted

## 2014-09-02 ENCOUNTER — Encounter: Payer: Self-pay | Admitting: Family

## 2014-09-02 ENCOUNTER — Ambulatory Visit (HOSPITAL_BASED_OUTPATIENT_CLINIC_OR_DEPARTMENT_OTHER): Payer: BLUE CROSS/BLUE SHIELD

## 2014-09-02 VITALS — BP 122/81 | HR 72

## 2014-09-02 VITALS — BP 121/83 | HR 72 | Temp 98.0°F | Resp 16 | Wt 206.0 lb

## 2014-09-02 DIAGNOSIS — D508 Other iron deficiency anemias: Secondary | ICD-10-CM

## 2014-09-02 DIAGNOSIS — K5090 Crohn's disease, unspecified, without complications: Secondary | ICD-10-CM | POA: Diagnosis not present

## 2014-09-02 DIAGNOSIS — D509 Iron deficiency anemia, unspecified: Secondary | ICD-10-CM

## 2014-09-02 LAB — CBC WITH DIFFERENTIAL (CANCER CENTER ONLY)
BASO#: 0 10*3/uL (ref 0.0–0.2)
BASO%: 0.3 % (ref 0.0–2.0)
EOS%: 2 % (ref 0.0–7.0)
Eosinophils Absolute: 0.1 10*3/uL (ref 0.0–0.5)
HEMATOCRIT: 38.7 % (ref 34.8–46.6)
HEMOGLOBIN: 12.6 g/dL (ref 11.6–15.9)
LYMPH#: 3 10*3/uL (ref 0.9–3.3)
LYMPH%: 42 % (ref 14.0–48.0)
MCH: 29.3 pg (ref 26.0–34.0)
MCHC: 32.6 g/dL (ref 32.0–36.0)
MCV: 90 fL (ref 81–101)
MONO#: 0.5 10*3/uL (ref 0.1–0.9)
MONO%: 7.3 % (ref 0.0–13.0)
NEUT%: 48.4 % (ref 39.6–80.0)
NEUTROS ABS: 3.4 10*3/uL (ref 1.5–6.5)
Platelets: 211 10*3/uL (ref 145–400)
RBC: 4.3 10*6/uL (ref 3.70–5.32)
RDW: 13.4 % (ref 11.1–15.7)
WBC: 7 10*3/uL (ref 3.9–10.0)

## 2014-09-02 LAB — RETICULOCYTES (CHCC)
ABS RETIC: 60.9 10*3/uL (ref 19.0–186.0)
RBC.: 4.35 MIL/uL (ref 3.87–5.11)
Retic Ct Pct: 1.4 % (ref 0.4–2.3)

## 2014-09-02 LAB — IRON AND TIBC CHCC
%SAT: 42 % (ref 21–57)
Iron: 90 ug/dL (ref 41–142)
TIBC: 213 ug/dL — ABNORMAL LOW (ref 236–444)
UIBC: 123 ug/dL (ref 120–384)

## 2014-09-02 LAB — VITAMIN B12: VITAMIN B 12: 860 pg/mL (ref 211–911)

## 2014-09-02 LAB — CHCC SATELLITE - SMEAR

## 2014-09-02 LAB — FERRITIN CHCC: FERRITIN: 362 ng/mL — AB (ref 9–269)

## 2014-09-02 MED ORDER — SODIUM CHLORIDE 0.9 % IV SOLN
INTRAVENOUS | Status: DC
  Administered 2014-09-02: 10:00:00 via INTRAVENOUS

## 2014-09-02 MED ORDER — SODIUM CHLORIDE 0.9 % IV SOLN
510.0000 mg | Freq: Once | INTRAVENOUS | Status: AC
  Administered 2014-09-02: 510 mg via INTRAVENOUS
  Filled 2014-09-02: qty 17

## 2014-09-02 NOTE — Progress Notes (Signed)
Hematology and Oncology Follow Up Visit  Stacie Cardenas [id number] 24-Jul-1980 34 y.o. 09/02/2014   Principle Diagnosis:  Iron deficiency anemia Crohn's disease Menometrorrhagia  Current Therapy:   IV iron as indicated-patient to receive a dose today.    Interim History: Stacie Cardenas is here today for a follow-up. She is doing ok. She is having some symptoms at this time. She is tired and chewing ice. She has had a Chron's flare and is having some abdominal sensitivity and a little blood in her stool.  Her Hgb today is 12.6 MCV 90.  She denies fever, chills, n/v, cough, rash, headache, dizziness, SOB, chest pain, palpitations, abdominal pain, constipation, blood in urine.  She last had iron in September. In December, her ferritin was 399 with an iron saturation 24%.  She is still having heavy cycles. No swelling, tenderness, numbness or tingling in her extremities. No new aches or pains.   Medications:    Medication List       This list is accurate as of: 09/02/14  9:25 AM.  Always use your most recent med list.               Adapalene-Benzoyl Peroxide 0.1-2.5 % gel  Apply 1 application topically every morning.     AMBULATORY NON FORMULARY MEDICATION  - Glucometer meter, lancets, and test strips.   -   - Test once a day fasting sugars.   -   - Dx: Diabetes Mellitus type II.     AMBULATORY NON FORMULARY MEDICATION  - Contour next test strips to test once daily as needed.   -   - DM, type II controlled.     azaTHIOprine 50 MG tablet  Commonly known as:  IMURAN  Take 1 tablet (50 mg total) by mouth daily.     budesonide 3 MG 24 hr capsule  Commonly known as:  ENTOCORT EC     dicyclomine 10 MG capsule  Commonly known as:  BENTYL  Take 1 capsule (10 mg total) by mouth 2 (two) times daily.     diphenoxylate-atropine 2.5-0.025 MG per tablet  Commonly known as:  LOMOTIL  Take 2 tablets by mouth 4 times a day as needed for diarrhea or loose stools     fluticasone 50 MCG/ACT nasal spray  Commonly known as:  FLONASE  Place 2 sprays into the nose as needed.     metFORMIN 500 MG 24 hr tablet  Commonly known as:  GLUCOPHAGE XR  Take 1 tablet (500 mg total) by mouth daily with breakfast.     ondansetron 4 MG tablet  Commonly known as:  ZOFRAN  Take 4 mg by mouth as needed for nausea.     simvastatin 40 MG tablet  Commonly known as:  ZOCOR  Take 1 tablet (40 mg total) by mouth every evening.     SUMAtriptan 100 MG tablet  Commonly known as:  IMITREX  Take 100 mg by mouth as needed for migraine.     telmisartan-hydrochlorothiazide 80-12.5 MG per tablet  Commonly known as:  MICARDIS HCT  Take 1 tablet by mouth daily.        Allergies:  Allergies  Allergen Reactions  . Ampicillin Diarrhea  . Xigduo Xr [Dapagliflozin-Metformin Hcl Er]     Yeast infections.     Past Medical History, Surgical history, Social history, and Family History were reviewed and updated.  Review of Systems: All other 10 point review of systems is negative.   Physical Exam:  weight  is 206 lb (93.441 kg). Her temperature is 98 F (36.7 C). Her blood pressure is 121/83 and her pulse is 72. Her respiration is 16.   Wt Readings from Last 3 Encounters:  09/02/14 206 lb (93.441 kg)  08/14/14 201 lb (91.173 kg)  06/08/14 193 lb (87.544 kg)    Ocular: Sclerae unicteric, pupils equal, round and reactive to light Ear-nose-throat: Oropharynx clear, dentition fair Lymphatic: No cervical or supraclavicular adenopathy Lungs no rales or rhonchi, good excursion bilaterally Heart regular rate and rhythm, no murmur appreciated Abd soft, nontender, positive bowel sounds MSK no focal spinal tenderness, no joint edema Neuro: non-focal, well-oriented, appropriate affect Breasts: Deferred  Lab Results  Component Value Date   WBC 4.9 05/19/2014   HGB 12.2 05/19/2014   HCT 37.3 05/19/2014   MCV 91 05/19/2014   PLT 251 05/19/2014   Lab Results  Component Value  Date   FERRITIN 399* 05/19/2014   IRON 44 05/19/2014   TIBC 181* 05/19/2014   UIBC 137 05/19/2014   IRONPCTSAT 24 05/19/2014   Lab Results  Component Value Date   RETICCTPCT 3.0* 05/19/2014   RBC 4.08 05/19/2014   RBC 4.15 05/19/2014   RETICCTABS 124.5 05/19/2014   No results found for: KPAFRELGTCHN, LAMBDASER, KAPLAMBRATIO No results found for: IGGSERUM, IGA, IGMSERUM No results found for: Odetta Pink, SPEI   Chemistry      Component Value Date/Time   NA 137 11/11/2013 1040   K 4.2 11/11/2013 1040   CL 101 11/11/2013 1040   CO2 27 11/11/2013 1040   BUN 8 11/11/2013 1040   CREATININE 0.77 11/11/2013 1040   CREATININE 0.78 04/27/2010 2349      Component Value Date/Time   CALCIUM 9.5 11/11/2013 1040   ALKPHOS 73 11/11/2013 1040   AST 17 11/11/2013 1040   ALT 20 11/11/2013 1040   BILITOT 0.3 11/11/2013 1040     Impression and Plan: Stacie Cardenas is a 34 year old African female with a history of iron deficiency.She also has Crohn's disease and is getting over a flare.  Her hemoglobin is is 12.6 MCV 90 today. She is symptomatic with fatigue and chewing ice.  We will give her a dose of Fereheme today.  We will see her back in 2 months for follow-up and labs.  She is in agreement with this and knows to call here with any questions or concerns. We can certainly see her sooner if need be.   Stacie Bottom, NP 3/23/20169:25 AM

## 2014-09-02 NOTE — Telephone Encounter (Signed)
-----   Message from Volanda Napoleon, MD sent at 09/02/2014  2:34 PM EDT ----- Call - iron is ok!! Thanks!! pete

## 2014-09-02 NOTE — Patient Instructions (Signed)

## 2014-11-04 ENCOUNTER — Encounter: Payer: Self-pay | Admitting: Hematology & Oncology

## 2014-11-04 ENCOUNTER — Ambulatory Visit (HOSPITAL_BASED_OUTPATIENT_CLINIC_OR_DEPARTMENT_OTHER): Payer: BLUE CROSS/BLUE SHIELD | Admitting: Hematology & Oncology

## 2014-11-04 ENCOUNTER — Ambulatory Visit: Payer: BLUE CROSS/BLUE SHIELD

## 2014-11-04 ENCOUNTER — Other Ambulatory Visit (HOSPITAL_BASED_OUTPATIENT_CLINIC_OR_DEPARTMENT_OTHER): Payer: BLUE CROSS/BLUE SHIELD

## 2014-11-04 VITALS — BP 130/77 | HR 81 | Temp 98.7°F | Resp 18 | Ht 64.0 in | Wt 207.0 lb

## 2014-11-04 DIAGNOSIS — D509 Iron deficiency anemia, unspecified: Secondary | ICD-10-CM

## 2014-11-04 DIAGNOSIS — D508 Other iron deficiency anemias: Secondary | ICD-10-CM

## 2014-11-04 LAB — IRON AND TIBC CHCC
%SAT: 36 % (ref 21–57)
Iron: 73 ug/dL (ref 41–142)
TIBC: 203 ug/dL — ABNORMAL LOW (ref 236–444)
UIBC: 129 ug/dL (ref 120–384)

## 2014-11-04 LAB — CBC WITH DIFFERENTIAL (CANCER CENTER ONLY)
BASO#: 0 10*3/uL (ref 0.0–0.2)
BASO%: 0.3 % (ref 0.0–2.0)
EOS ABS: 0.1 10*3/uL (ref 0.0–0.5)
EOS%: 1.7 % (ref 0.0–7.0)
HCT: 40.7 % (ref 34.8–46.6)
HGB: 13.6 g/dL (ref 11.6–15.9)
LYMPH#: 3 10*3/uL (ref 0.9–3.3)
LYMPH%: 39.9 % (ref 14.0–48.0)
MCH: 30.5 pg (ref 26.0–34.0)
MCHC: 33.4 g/dL (ref 32.0–36.0)
MCV: 91 fL (ref 81–101)
MONO#: 0.5 10*3/uL (ref 0.1–0.9)
MONO%: 6.2 % (ref 0.0–13.0)
NEUT#: 3.9 10*3/uL (ref 1.5–6.5)
NEUT%: 51.9 % (ref 39.6–80.0)
Platelets: 225 10*3/uL (ref 145–400)
RBC: 4.46 10*6/uL (ref 3.70–5.32)
RDW: 13 % (ref 11.1–15.7)
WBC: 7.4 10*3/uL (ref 3.9–10.0)

## 2014-11-04 LAB — FERRITIN CHCC: FERRITIN: 491 ng/mL — AB (ref 9–269)

## 2014-11-04 LAB — RETICULOCYTES (CHCC)
ABS Retic: 85.3 10*3/uL (ref 19.0–186.0)
RBC.: 4.49 MIL/uL (ref 3.87–5.11)
RETIC CT PCT: 1.9 % (ref 0.4–2.3)

## 2014-11-04 LAB — CHCC SATELLITE - SMEAR

## 2014-11-04 NOTE — Progress Notes (Signed)
Hematology and Oncology Follow Up Visit  Stacie Cardenas [id number] 12/27/1980 34 y.o. 11/04/2014   Principle Diagnosis:  Iron deficiency anemia Crohn's disease Menometrorrhagia  Current Therapy:   IV iron as indicated-patient last received a dose in March 2016    Interim History: Ms. Stacie Cardenas is here today for a follow-up. She is doing ok. She feels better. Her Crohn's disease is doing okay. I think she still gets some treatment for this with one of the monoclonal antibodies.  She did get iron back in March. This made her feel better.  [name] not noted any obvious bright red blood per rectum.  Her appetite has been doing well area and she's had no problems with leg swelling. [name] had no joint issues. Had no rashes. [name] had no diarrhea. [name] had no fever.  Overall, her point status is ECOG 1. .   Medications:    Medication List       This list is accurate as of: 11/04/14  6:37 PM.  Always use your most recent med list.               Adapalene-Benzoyl Peroxide 0.1-2.5 % gel  Apply 1 application topically every morning.     AMBULATORY NON FORMULARY MEDICATION  - Glucometer meter, lancets, and test strips.   -   - Test once a day fasting sugars.   -   - Dx: Diabetes Mellitus type II.     AMBULATORY NON FORMULARY MEDICATION  - Contour next test strips to test once daily as needed.   -   - DM, type II controlled.     azaTHIOprine 50 MG tablet  Commonly known as:  IMURAN  Take 1 tablet (50 mg total) by mouth daily.     BAYER CONTOUR NEXT MONITOR W/DEVICE Kit     BAYER CONTOUR NEXT TEST test strip  Generic drug:  glucose blood     BAYER MICROLET LANCETS lancets     budesonide 3 MG 24 hr capsule  Commonly known as:  ENTOCORT EC     dicyclomine 10 MG capsule  Commonly known as:  BENTYL  Take 1 capsule (10 mg total) by mouth 2 (two) times daily.     diphenoxylate-atropine 2.5-0.025 MG per tablet  Commonly known as:  LOMOTIL  Take 2 tablets by mouth 4  times a day as needed for diarrhea or loose stools     fluticasone 50 MCG/ACT nasal spray  Commonly known as:  FLONASE  Place 2 sprays into the nose as needed.     metFORMIN 500 MG 24 hr tablet  Commonly known as:  GLUCOPHAGE XR  Take 1 tablet (500 mg total) by mouth daily with breakfast.     ondansetron 4 MG tablet  Commonly known as:  ZOFRAN  Take 4 mg by mouth as needed for nausea.     simvastatin 40 MG tablet  Commonly known as:  ZOCOR  Take 1 tablet (40 mg total) by mouth every evening.     SUMAtriptan 100 MG tablet  Commonly known as:  IMITREX  Take 100 mg by mouth as needed for migraine.     telmisartan-hydrochlorothiazide 80-12.5 MG per tablet  Commonly known as:  MICARDIS HCT  Take 1 tablet by mouth daily.        Allergies:  Allergies  Allergen Reactions  . Ampicillin Diarrhea  . Xigduo Xr [Dapagliflozin-Metformin Hcl Er]     Yeast infections.     Past Medical History, Surgical history, Social history,  and Family History were reviewed and updated.  Review of Systems: All other 10 point review of systems is negative.   Physical Exam:  height is 5' 4" (1.626 m) and weight is 207 lb (93.895 kg). Her oral temperature is 98.7 F (37.1 C). Her blood pressure is 130/77 and her pulse is 81. Her respiration is 18.   Wt Readings from Last 3 Encounters:  11/04/14 207 lb (93.895 kg)  09/02/14 206 lb (93.441 kg)  08/14/14 201 lb (91.173 kg)    Ocular: Sclerae unicteric, pupils equal, round and reactive to light Ear-nose-throat: Oropharynx clear, dentition fair Lymphatic: No cervical or supraclavicular adenopathy Lungs no rales or rhonchi, good excursion bilaterally Heart regular rate and rhythm, no murmur appreciated Abd soft, nontender, positive bowel sounds MSK no focal spinal tenderness, no joint edema Neuro: non-focal, well-oriented, appropriate affect Breasts: Deferred  Lab Results  Component Value Date   WBC 7.4 11/04/2014   HGB 13.6 11/04/2014    HCT 40.7 11/04/2014   MCV 91 11/04/2014   PLT 225 11/04/2014   Lab Results  Component Value Date   FERRITIN 491* 11/04/2014   IRON 73 11/04/2014   TIBC 203* 11/04/2014   UIBC 129 11/04/2014   IRONPCTSAT 36 11/04/2014   Lab Results  Component Value Date   RETICCTPCT 1.9 11/04/2014   RBC 4.49 11/04/2014   RETICCTABS 85.3 11/04/2014   No results found for: KPAFRELGTCHN, LAMBDASER, KAPLAMBRATIO No results found for: IGGSERUM, IGA, IGMSERUM No results found for: TOTALPROTELP, ALBUMINELP, A1GS, A2GS, BETS, BETA2SER, GAMS, MSPIKE, SPEI   Chemistry      Component Value Date/Time   NA 137 11/11/2013 1040   K 4.2 11/11/2013 1040   CL 101 11/11/2013 1040   CO2 27 11/11/2013 1040   BUN 8 11/11/2013 1040   CREATININE 0.77 11/11/2013 1040   CREATININE 0.78 04/27/2010 2349      Component Value Date/Time   CALCIUM 9.5 11/11/2013 1040   ALKPHOS 73 11/11/2013 1040   AST 17 11/11/2013 1040   ALT 20 11/11/2013 1040   BILITOT 0.3 11/11/2013 1040     Impression and Plan: Ms. Stacie Cardenas is a 33-year-old African female with a history of iron deficiency.She also has Crohn's disease . She is doing quite well. Her iron studies look good. Her ferritin is 491 with an iron saturation of 36%. Her hemoglobin is better. As such, I doubt that she will need any iron.  I think we can probably in her back to see us in another 3 or 4 months.   , R, MD 5/25/20166:37 PM   

## 2014-12-31 ENCOUNTER — Encounter: Payer: Self-pay | Admitting: Family Medicine

## 2014-12-31 ENCOUNTER — Ambulatory Visit (INDEPENDENT_AMBULATORY_CARE_PROVIDER_SITE_OTHER): Payer: BLUE CROSS/BLUE SHIELD | Admitting: Family Medicine

## 2014-12-31 VITALS — BP 130/86 | HR 70 | Wt 207.0 lb

## 2014-12-31 DIAGNOSIS — L02412 Cutaneous abscess of left axilla: Secondary | ICD-10-CM | POA: Diagnosis not present

## 2014-12-31 MED ORDER — DOXYCYCLINE HYCLATE 100 MG PO TABS: 100.0000 mg | ORAL_TABLET | Freq: Two times a day (BID) | ORAL | Status: DC

## 2014-12-31 MED ORDER — FLUCONAZOLE 150 MG PO TABS: 150.0000 mg | ORAL_TABLET | Freq: Once | ORAL | Status: DC

## 2014-12-31 NOTE — Patient Instructions (Signed)
Thank you for coming in today.  Abscess Care After An abscess (also called a boil or furuncle) is an infected area that contains a collection of pus. Signs and symptoms of an abscess include pain, tenderness, redness, or hardness, or you may feel a moveable soft area under your skin. An abscess can occur anywhere in the body. The infection may spread to surrounding tissues causing cellulitis. A cut (incision) by the surgeon was made over your abscess and the pus was drained out. Gauze may have been packed into the space to provide a drain that will allow the cavity to heal from the inside outwards. The boil may be painful for 5 to 7 days. Most people with a boil do not have high fevers. Your abscess, if seen early, may not have localized, and may not have been lanced. If not, another appointment may be required for this if it does not get better on its own or with medications. HOME CARE INSTRUCTIONS   Only take over-the-counter or prescription medicines for pain, discomfort, or fever as directed by your caregiver.  When you bathe, soak and then remove gauze or iodoform packs at least daily or as directed by your caregiver. You may then wash the wound gently with mild soapy water. Repack with gauze or do as your caregiver directs. SEEK IMMEDIATE MEDICAL CARE IF:   You develop increased pain, swelling, redness, drainage, or bleeding in the wound site.  You develop signs of generalized infection including muscle aches, chills, fever, or a general ill feeling.  An oral temperature above 102 F (38.9 C) develops, not controlled by medication. See your caregiver for a recheck if you develop any of the symptoms described above. If medications (antibiotics) were prescribed, take them as directed. Document Released: 12/15/2004 Document Revised: 08/21/2011 Document Reviewed: 08/12/2007 [facility] Patient Information 2015 [place], [place]. This information is not intended to replace advice given to you by your  health care provider. Make sure you discuss any questions you have with your health care provider.

## 2014-12-31 NOTE — Assessment & Plan Note (Signed)
Treated with incision and drainage. Empiric treatment with doxycycline antibiotics given patient's relative immunocompromise status. Return as needed.

## 2014-12-31 NOTE — Progress Notes (Signed)
Stacie Cardenas is a 34 y.o. female who presents to [facility] ([facility])  today for abscess of the left axilla. Present for weeks worsening of the last week. No fevers chills nausea vomiting or diarrhea. Patient has tried warm compresses which has not helped. She has had similar abscesses in the past. She has Crohn's disease and diabetes and takes Remicade for her Crohn's disease.   Past Medical History  Diagnosis Date  . Hypertension   . Hypercholesterolemia   . Other specified iron deficiency anemias 03/17/2013  . IBS (irritable bowel syndrome)    Past Surgical History  Procedure Laterality Date  . Cesarean section     History  Substance Use Topics  . Smoking status: Never Smoker   . Smokeless tobacco: Never Used     Comment: never used tobacco  . Alcohol Use: No   ROS as above Medications: Current Outpatient Prescriptions  Medication Sig Dispense Refill  . Adapalene-Benzoyl Peroxide 0.1-2.5 % gel Apply 1 application topically every morning.    . AMBULATORY NON FORMULARY MEDICATION Glucometer meter, lancets, and test strips.   Test once a day fasting sugars.   Dx: Diabetes Mellitus type II. 100 strip 0  . AMBULATORY NON FORMULARY MEDICATION Contour next test strips to test once daily as needed.   DM, type II controlled. 100 strip 1  . azaTHIOprine (IMURAN) 50 MG tablet Take 1 tablet (50 mg total) by mouth daily.    Marland Kitchen BAYER CONTOUR NEXT TEST test strip   0  . BAYER MICROLET LANCETS lancets   0  . Blood Glucose Monitoring Suppl (BAYER CONTOUR NEXT MONITOR) W/DEVICE KIT   0  . budesonide (ENTOCORT EC) 3 MG 24 hr capsule   4  . dicyclomine (BENTYL) 10 MG capsule Take 1 capsule (10 mg total) by mouth 2 (two) times daily. 60 capsule 0  . diphenoxylate-atropine (LOMOTIL) 2.5-0.025 MG per tablet Take 2 tablets by mouth 4 times a day as needed for diarrhea or loose stools 30 tablet 0  . metFORMIN (GLUCOPHAGE XR) 500 MG 24 hr tablet Take 1 tablet (500 mg  total) by mouth daily with breakfast. 30 tablet 5  . ondansetron (ZOFRAN) 4 MG tablet Take 4 mg by mouth as needed for nausea.    . simvastatin (ZOCOR) 40 MG tablet Take 1 tablet (40 mg total) by mouth every evening. 30 tablet 11  . SUMAtriptan (IMITREX) 100 MG tablet Take 100 mg by mouth as needed for migraine.    Marland Kitchen telmisartan-hydrochlorothiazide (MICARDIS HCT) 80-12.5 MG per tablet Take 1 tablet by mouth daily. 30 tablet 6  . doxycycline (VIBRA-TABS) 100 MG tablet Take 1 tablet (100 mg total) by mouth 2 (two) times daily. 14 tablet 0  . fluconazole (DIFLUCAN) 150 MG tablet Take 1 tablet (150 mg total) by mouth once. 1 tablet 1  . fluticasone (FLONASE) 50 MCG/ACT nasal spray Place 2 sprays into the nose as needed.     No current facility-administered medications for this visit.   Allergies  Allergen Reactions  . Ampicillin Diarrhea  . Xigduo Xr [Dapagliflozin-Metformin Hcl Er]     Yeast infections.      Exam:  BP 130/86 mmHg  Pulse 70  Wt 207 lb (93.895 kg) Gen: Well NAD HEENT: EOMI,  MMM Lungs: Normal work of breathing. CTABL Heart: RRR no MRG Abd: NABS, Soft. Nondistended, Nontender Exts: Brisk capillary refill, warm and well perfused.  Skin: Left axilla 2 cm fluctuant and tender area left axilla without  induration. No discharge.  Abscess incision and drainage. Consent obtained and timeout performed. Skin cleaned with alcohol, and cold spray applied. 4 mL of lidocaine with epi injected achieving good anesthesia. Skin was again cleaned with alcohol. A sharp incision was made to the area of fluctuance. The incision was widened and pus was expressed. Blunt dissection was used to break up loculations. Further pus was expressed. Patient tolerated the procedure well. A dressing was applied   No results found for this or any previous visit (from the past 24 hour(s)). No results found.   Please see individual assessment and plan sections.

## 2015-02-03 ENCOUNTER — Ambulatory Visit (INDEPENDENT_AMBULATORY_CARE_PROVIDER_SITE_OTHER): Payer: BLUE CROSS/BLUE SHIELD | Admitting: Physician Assistant

## 2015-02-03 ENCOUNTER — Ambulatory Visit (INDEPENDENT_AMBULATORY_CARE_PROVIDER_SITE_OTHER): Payer: BLUE CROSS/BLUE SHIELD

## 2015-02-03 ENCOUNTER — Encounter: Payer: Self-pay | Admitting: Physician Assistant

## 2015-02-03 VITALS — BP 135/91 | HR 74 | Temp 98.0°F | Ht 64.0 in | Wt 208.0 lb

## 2015-02-03 DIAGNOSIS — Z23 Encounter for immunization: Secondary | ICD-10-CM | POA: Diagnosis not present

## 2015-02-03 DIAGNOSIS — R1031 Right lower quadrant pain: Secondary | ICD-10-CM

## 2015-02-03 LAB — CBC WITH DIFFERENTIAL/PLATELET
BASOS PCT: 0 % (ref 0–1)
Basophils Absolute: 0 10*3/uL (ref 0.0–0.1)
EOS ABS: 0.1 10*3/uL (ref 0.0–0.7)
EOS PCT: 2 % (ref 0–5)
HCT: 38.8 % (ref 36.0–46.0)
Hemoglobin: 13.3 g/dL (ref 12.0–15.0)
Lymphocytes Relative: 44 % (ref 12–46)
Lymphs Abs: 3.2 10*3/uL (ref 0.7–4.0)
MCH: 29.6 pg (ref 26.0–34.0)
MCHC: 34.3 g/dL (ref 30.0–36.0)
MCV: 86.4 fL (ref 78.0–100.0)
MONO ABS: 0.7 10*3/uL (ref 0.1–1.0)
MPV: 10.9 fL (ref 8.6–12.4)
Monocytes Relative: 9 % (ref 3–12)
NEUTROS ABS: 3.3 10*3/uL (ref 1.7–7.7)
Neutrophils Relative %: 45 % (ref 43–77)
PLATELETS: 241 10*3/uL (ref 150–400)
RBC: 4.49 MIL/uL (ref 3.87–5.11)
RDW: 13.4 % (ref 11.5–15.5)
WBC: 7.3 10*3/uL (ref 4.0–10.5)

## 2015-02-03 MED ORDER — HYDROCODONE-ACETAMINOPHEN 5-325 MG PO TABS: 1.0000 | ORAL_TABLET | Freq: Three times a day (TID) | ORAL | Status: DC | PRN

## 2015-02-03 MED ORDER — TRIAMCINOLONE ACETONIDE 0.1 % EX CREA: 1.0000 "application " | TOPICAL_CREAM | Freq: Two times a day (BID) | CUTANEOUS | Status: DC

## 2015-02-03 MED ORDER — KETOROLAC TROMETHAMINE 30 MG/ML IJ SOLN
30.0000 mg | Freq: Once | INTRAMUSCULAR | Status: AC
  Administered 2015-02-03: 30 mg via INTRAMUSCULAR

## 2015-02-03 NOTE — Progress Notes (Signed)
   Subjective:    Patient ID: Stacie Cardenas, female    DOB: 08-27-1980, 34 y.o.   MRN: [id number]  HPI  Pt presents to the clinic with RLQ pain that started Saturday night, 4 days ago. She has hx of having very similar pain but around her period;however her period just ended and pain has continued. She has chrons disease and has GI appt today. Pain is constant nothing makes worse or better. Rates pain 10/10 and woke her out of her sleep at 12:30 this am. She had a little bit of blood in stool this am but on occasion is known to have this with crohns. No nausea, vomiting or fever. Not taken anything to make better today. Over weekend took tylenol and ibuprofen with no relief. She had left over tramadol with no relief. She is having daily bowel movements that are unchanged and solid. Per pt she does not always take medications for chrons everyday. She is not taking bentyl. No painful urination.  Review of Systems  All other systems reviewed and are negative.      Objective:   Physical Exam  Constitutional: She is oriented to person, place, and time. She appears well-developed and well-nourished.  HENT:  Head: Normocephalic and atraumatic.  Cardiovascular: Normal rate, regular rhythm and normal heart sounds.   Pulmonary/Chest: Effort normal and breath sounds normal.  Negative CVA tenderness.  Abdominal: Soft. Bowel sounds are normal.  Tenderness over RLQ to palpation no guarding or rebound tenderness just painful to palpate.  Negative psoas sign.   Neurological: She is alert and oriented to person, place, and time.  Psychiatric: She has a normal mood and affect. Her behavior is normal.          Assessment & Plan:  RLQ pain- low suspicion of appendicitis or diverticulitis. Will get cbc to confirm no WBC elevation. Due to ongoing hx and around cycle will get pelvic u/s today. In meantime pt was given toradol IM for pain and inflammation and small quanity of norco for pain since tylenol,  tramadol and ibuprofen not working.   Flu shot given today.

## 2015-02-12 ENCOUNTER — Encounter: Payer: Self-pay | Admitting: Physician Assistant

## 2015-02-12 ENCOUNTER — Other Ambulatory Visit (HOSPITAL_COMMUNITY)
Admission: RE | Admit: 2015-02-12 | Discharge: 2015-02-12 | Disposition: A | Discharge status: Home / Self Care | Payer: BLUE CROSS/BLUE SHIELD | Source: Ambulatory Visit | Attending: Physician Assistant | Admitting: Physician Assistant

## 2015-02-12 ENCOUNTER — Ambulatory Visit (INDEPENDENT_AMBULATORY_CARE_PROVIDER_SITE_OTHER): Payer: BLUE CROSS/BLUE SHIELD | Admitting: Physician Assistant

## 2015-02-12 VITALS — BP 130/85 | HR 76 | Ht 64.0 in | Wt 212.0 lb

## 2015-02-12 DIAGNOSIS — Z113 Encounter for screening for infections with a predominantly sexual mode of transmission: Secondary | ICD-10-CM | POA: Diagnosis present

## 2015-02-12 DIAGNOSIS — B9689 Other specified bacterial agents as the cause of diseases classified elsewhere: Secondary | ICD-10-CM

## 2015-02-12 DIAGNOSIS — Z01419 Encounter for gynecological examination (general) (routine) without abnormal findings: Secondary | ICD-10-CM | POA: Insufficient documentation

## 2015-02-12 DIAGNOSIS — R7309 Other abnormal glucose: Secondary | ICD-10-CM

## 2015-02-12 DIAGNOSIS — Z0000 Encounter for general adult medical examination without abnormal findings: Secondary | ICD-10-CM

## 2015-02-12 DIAGNOSIS — Z1151 Encounter for screening for human papillomavirus (HPV): Secondary | ICD-10-CM | POA: Insufficient documentation

## 2015-02-12 DIAGNOSIS — Z79899 Other long term (current) drug therapy: Secondary | ICD-10-CM | POA: Diagnosis not present

## 2015-02-12 DIAGNOSIS — N760 Acute vaginitis: Secondary | ICD-10-CM | POA: Diagnosis present

## 2015-02-12 DIAGNOSIS — E785 Hyperlipidemia, unspecified: Secondary | ICD-10-CM | POA: Diagnosis not present

## 2015-02-12 DIAGNOSIS — I10 Essential (primary) hypertension: Secondary | ICD-10-CM

## 2015-02-12 DIAGNOSIS — A499 Bacterial infection, unspecified: Secondary | ICD-10-CM

## 2015-02-12 LAB — POCT GLYCOSYLATED HEMOGLOBIN (HGB A1C): HEMOGLOBIN A1C: 5.3

## 2015-02-12 NOTE — Patient Instructions (Signed)

## 2015-02-16 MED ORDER — TELMISARTAN-HCTZ 80-12.5 MG PO TABS: 1.0000 | ORAL_TABLET | Freq: Every day | ORAL | Status: DC

## 2015-02-16 NOTE — Progress Notes (Signed)
  Subjective:     Stacie Cardenas is a 34 y.o. female and is here for a comprehensive physical exam. The patient reports no problems.  Social History   Social History  . Marital Status: Married    Spouse Name: N/A  . Number of Children: N/A  . Years of Education: N/A   Occupational History  . Not on file.   Social History Main Topics  . Smoking status: Never Smoker   . Smokeless tobacco: Never Used     Comment: never used tobacco  . Alcohol Use: No  . Drug Use: No  . Sexual Activity: Yes    Birth Control/ Protection: Surgical   Other Topics Concern  . Not on file   Social History Narrative   Health Maintenance  Topic Date Due  . OPHTHALMOLOGY EXAM  01/07/1991  . PAP SMEAR  05/09/2013  . URINE MICROALBUMIN  05/07/2015  . HEMOGLOBIN A1C  08/12/2015  . FOOT EXAM  08/14/2015  . INFLUENZA VACCINE  01/11/2016  . PNEUMOCOCCAL POLYSACCHARIDE VACCINE (2) 08/14/2019  . TETANUS/TDAP  03/14/2022  . HIV Screening  Completed    The following portions of the patient's history were reviewed and updated as appropriate: allergies, current medications, past family history, past medical history, past social history, past surgical history and problem list.  Review of Systems A comprehensive review of systems was negative.   Objective:    BP 130/85 mmHg  Pulse 76  Ht 5' 4"  (1.626 m)  Wt 212 lb (96.163 kg)  BMI 36.37 kg/m2  LMP 01/25/2015 General appearance: alert, cooperative, appears stated age and moderately obese Head: Normocephalic, without obvious abnormality, atraumatic Eyes: conjunctivae/corneas clear. PERRL, EOM's intact. Fundi benign. Ears: normal TM's and external ear canals both ears Nose: Nares normal. Septum midline. Mucosa normal. No drainage or sinus tenderness. Throat: lips, mucosa, and tongue normal; teeth and gums normal Neck: no adenopathy, no carotid bruit, no JVD, supple, symmetrical, trachea midline and thyroid not enlarged, symmetric, no  tenderness/mass/nodules Back: symmetric, no curvature. ROM normal. No CVA tenderness. Lungs: clear to auscultation bilaterally Breasts: normal appearance, no masses or tenderness Heart: regular rate and rhythm, S1, S2 normal, no murmur, click, rub or gallop Abdomen: soft, non-tender; bowel sounds normal; no masses,  no organomegaly Pelvic: cervix normal in appearance, external genitalia normal, no adnexal masses or tenderness, no cervical motion tenderness, uterus normal size, shape, and consistency and vagina normal without discharge Extremities: extremities normal, atraumatic, no cyanosis or edema Pulses: 2+ and symmetric Skin: Skin color, texture, turgor normal. No rashes or lesions Lymph nodes: Cervical, supraclavicular, and axillary nodes normal. Neurologic: Grossly normal    Assessment:    Healthy female exam.      Plan:    CPE- pap done today. STD testing added. Fasting labs ordered. Discussed weight loss and healthy diet and exercise. Encouraged patient to start vitamin D 800 units and calcium 1527m.   HTN- rechecked and under 130/90. Refilled for 6 months.   Hyperlipidemia- will recheck and discuss plan.   Elevated A!C- .. Lab Results  Component Value Date   HGBA1C 5.3 02/12/2015   Amazing. Doing great and not taking metformin. Stay off and will recheck in 6 months.  See After Visit Summary for Counseling Recommendations

## 2015-02-17 LAB — CYTOLOGY - PAP

## 2015-02-18 LAB — CERVICOVAGINAL ANCILLARY ONLY: Candida vaginitis: NEGATIVE

## 2015-02-18 MED ORDER — METRONIDAZOLE 500 MG PO TABS: 500.0000 mg | ORAL_TABLET | Freq: Two times a day (BID) | ORAL | Status: DC

## 2015-02-18 NOTE — Addendum Note (Signed)
Addended by: Huel Cote on: 02/18/2015 04:19 PM   Modules accepted: Orders

## 2015-02-27 LAB — HM DIABETES EYE EXAM

## 2015-03-03 ENCOUNTER — Ambulatory Visit: Payer: BLUE CROSS/BLUE SHIELD

## 2015-03-08 ENCOUNTER — Encounter: Payer: Self-pay | Admitting: Family

## 2015-03-08 ENCOUNTER — Other Ambulatory Visit (HOSPITAL_BASED_OUTPATIENT_CLINIC_OR_DEPARTMENT_OTHER): Payer: BLUE CROSS/BLUE SHIELD

## 2015-03-08 ENCOUNTER — Ambulatory Visit (HOSPITAL_BASED_OUTPATIENT_CLINIC_OR_DEPARTMENT_OTHER): Payer: BLUE CROSS/BLUE SHIELD | Admitting: Family

## 2015-03-08 VITALS — BP 147/76 | HR 80 | Temp 98.0°F | Resp 16 | Ht 64.0 in | Wt 211.0 lb

## 2015-03-08 DIAGNOSIS — D508 Other iron deficiency anemias: Secondary | ICD-10-CM

## 2015-03-08 DIAGNOSIS — D509 Iron deficiency anemia, unspecified: Secondary | ICD-10-CM

## 2015-03-08 LAB — RETICULOCYTES (CHCC)
ABS Retic: 95.8 10*3/uL (ref 19.0–186.0)
RBC.: 4.79 MIL/uL (ref 3.87–5.11)
Retic Ct Pct: 2 % (ref 0.4–2.3)

## 2015-03-08 LAB — CBC WITH DIFFERENTIAL (CANCER CENTER ONLY)
BASO#: 0 10*3/uL (ref 0.0–0.2)
BASO%: 0.2 % (ref 0.0–2.0)
EOS%: 1.1 % (ref 0.0–7.0)
Eosinophils Absolute: 0.1 10*3/uL (ref 0.0–0.5)
HEMATOCRIT: 43.1 % (ref 34.8–46.6)
HGB: 14 g/dL (ref 11.6–15.9)
LYMPH#: 3.4 10*3/uL — ABNORMAL HIGH (ref 0.9–3.3)
LYMPH%: 39.6 % (ref 14.0–48.0)
MCH: 29.1 pg (ref 26.0–34.0)
MCHC: 32.5 g/dL (ref 32.0–36.0)
MCV: 90 fL (ref 81–101)
MONO#: 0.7 10*3/uL (ref 0.1–0.9)
MONO%: 8.5 % (ref 0.0–13.0)
NEUT#: 4.3 10*3/uL (ref 1.5–6.5)
NEUT%: 50.6 % (ref 39.6–80.0)
Platelets: 239 10*3/uL (ref 145–400)
RBC: 4.81 10*6/uL (ref 3.70–5.32)
RDW: 13.2 % (ref 11.1–15.7)
WBC: 8.6 10*3/uL (ref 3.9–10.0)

## 2015-03-08 LAB — IRON AND TIBC CHCC
%SAT: 28 % (ref 21–57)
IRON: 65 ug/dL (ref 41–142)
TIBC: 230 ug/dL — AB (ref 236–444)
UIBC: 165 ug/dL (ref 120–384)

## 2015-03-08 LAB — FERRITIN CHCC: Ferritin: 307 ng/ml — ABNORMAL HIGH (ref 9–269)

## 2015-03-08 MED ORDER — SODIUM CHLORIDE 0.9 % IV SOLN: 510.0000 mg | Freq: Once | INTRAVENOUS | Status: DC

## 2015-03-08 NOTE — Progress Notes (Signed)
Hematology and Oncology Follow Up Visit  Stacie Cardenas [id number] 07-Jan-1981 34 y.o. 03/08/2015   Principle Diagnosis:  Iron deficiency anemia Crohn's disease Menometrorrhagia  Current Therapy:   IV iron as indicated-patient to receive a dose today.    Interim History: Stacie Cardenas is here today for a follow-up. She is feeling extremely fatigued, chills and having numbness and tingling in her extremities.  Her last dose of Feraheme was in March. Her iron studies responded nicely to this.  She has Crohn's disease and gets Remicade periodically. Since starting this medications her flares have been controlled and she has not had any problems.  She denies fever, n/v, cough, rash, headache, dizziness, SOB, chest pain, palpitations, abdominal pain, changes in bowel or bladder habits. She denies having blood in her stool.  Her cycles are still heavy. No swelling or tenderness in her extremities.   Medications:    Medication List       This list is accurate as of: 03/08/15 10:36 AM.  Always use your most recent med list.               Adapalene-Benzoyl Peroxide 0.1-2.5 % gel  Apply 1 application topically every morning.     azaTHIOprine 50 MG tablet  Commonly known as:  IMURAN  Take 1 tablet (50 mg total) by mouth daily.     dicyclomine 10 MG capsule  Commonly known as:  BENTYL  Take 1 capsule (10 mg total) by mouth 2 (two) times daily.     diphenoxylate-atropine 2.5-0.025 MG per tablet  Commonly known as:  LOMOTIL  Take 2 tablets by mouth 4 times a day as needed for diarrhea or loose stools     doxycycline 100 MG tablet  Commonly known as:  VIBRA-TABS  TAKE 1 TABLET (100 MG TOTAL) BY MOUTH 2 (TWO) TIMES DAILY.     fluticasone 50 MCG/ACT nasal spray  Commonly known as:  FLONASE  Place 2 sprays into the nose as needed.     HYDROcodone-acetaminophen 5-325 MG per tablet  Commonly known as:  NORCO/VICODIN  Take 1 tablet by mouth every 8 (eight) hours as needed for moderate  pain.     ondansetron 4 MG tablet  Commonly known as:  ZOFRAN  Take 4 mg by mouth as needed for nausea.     simvastatin 40 MG tablet  Commonly known as:  ZOCOR  Take 1 tablet (40 mg total) by mouth every evening.     SUMAtriptan 100 MG tablet  Commonly known as:  IMITREX  Take 100 mg by mouth as needed for migraine.     telmisartan-hydrochlorothiazide 80-12.5 MG per tablet  Commonly known as:  MICARDIS HCT  Take 1 tablet by mouth daily.     triamcinolone cream 0.1 %  Commonly known as:  KENALOG  Apply 1 application topically 2 (two) times daily.        Allergies:  Allergies  Allergen Reactions  . Ampicillin Diarrhea  . Xigduo Xr [Dapagliflozin-Metformin Hcl Er]     Yeast infections.     Past Medical History, Surgical history, Social history, and Family History were reviewed and updated.  Review of Systems: All other 10 point review of systems is negative.   Physical Exam:  height is 5' 4"  (1.626 m) and weight is 211 lb (95.709 kg). Her oral temperature is 98 F (36.7 C). Her blood pressure is 147/76 and her pulse is 80. Her respiration is 16.   Wt Readings from Last 3 Encounters:  03/08/15 211 lb (95.709 kg)  02/12/15 212 lb (96.163 kg)  02/03/15 208 lb (94.348 kg)    Ocular: Sclerae unicteric, pupils equal, round and reactive to light Ear-nose-throat: Oropharynx clear, dentition fair Lymphatic: No cervical or supraclavicular adenopathy Lungs no rales or rhonchi, good excursion bilaterally Heart regular rate and rhythm, no murmur appreciated Abd soft, nontender, positive bowel sounds MSK no focal spinal tenderness, no joint edema Neuro: non-focal, well-oriented, appropriate affect Breasts: Deferred  Lab Results  Component Value Date   WBC 8.6 03/08/2015   HGB 14.0 03/08/2015   HCT 43.1 03/08/2015   MCV 90 03/08/2015   PLT 239 03/08/2015   Lab Results  Component Value Date   FERRITIN 491* 11/04/2014   IRON 73 11/04/2014   TIBC 203* 11/04/2014    UIBC 129 11/04/2014   IRONPCTSAT 36 11/04/2014   Lab Results  Component Value Date   RETICCTPCT 1.9 11/04/2014   RBC 4.81 03/08/2015   RETICCTABS 85.3 11/04/2014   No results found for: KPAFRELGTCHN, LAMBDASER, KAPLAMBRATIO No results found for: IGGSERUM, IGA, IGMSERUM No results found for: Odetta Pink, SPEI   Chemistry      Component Value Date/Time   NA 137 11/11/2013 1040   K 4.2 11/11/2013 1040   CL 101 11/11/2013 1040   CO2 27 11/11/2013 1040   BUN 8 11/11/2013 1040   CREATININE 0.77 11/11/2013 1040   CREATININE 0.78 04/27/2010 2349      Component Value Date/Time   CALCIUM 9.5 11/11/2013 1040   ALKPHOS 73 11/11/2013 1040   AST 17 11/11/2013 1040   ALT 20 11/11/2013 1040   BILITOT 0.3 11/11/2013 1040     Impression and Plan: Stacie Cardenas is a 34 yo African female with a history of iron deficiency secondary to menorrhagia and crohn's disease. She is symptomatic today with fatigue, chills and numbness and tingling in her hands.  She will get a dose of Feraheme today. We will see what her iron studies show and determine at that time if she needs a second dose in 8 days.  We will see her back in 4 months for follow-up and labs.  She knows to contact us with any questions or concerns. We can certainly see her sooner if need be.   Eliezer Bottom, NP 9/26/201610:36 AM

## 2015-03-10 ENCOUNTER — Ambulatory Visit (HOSPITAL_BASED_OUTPATIENT_CLINIC_OR_DEPARTMENT_OTHER): Payer: BLUE CROSS/BLUE SHIELD

## 2015-03-10 VITALS — BP 138/64 | HR 77 | Temp 98.2°F | Resp 18

## 2015-03-10 DIAGNOSIS — D509 Iron deficiency anemia, unspecified: Secondary | ICD-10-CM | POA: Diagnosis not present

## 2015-03-10 DIAGNOSIS — D508 Other iron deficiency anemias: Secondary | ICD-10-CM

## 2015-03-10 MED ORDER — SODIUM CHLORIDE 0.9 % IV SOLN
510.0000 mg | Freq: Once | INTRAVENOUS | Status: AC
  Administered 2015-03-10: 510 mg via INTRAVENOUS
  Filled 2015-03-10: qty 17

## 2015-03-10 NOTE — Patient Instructions (Signed)

## 2015-03-31 ENCOUNTER — Other Ambulatory Visit: Payer: Self-pay | Admitting: *Deleted

## 2015-03-31 ENCOUNTER — Telehealth: Payer: Self-pay | Admitting: *Deleted

## 2015-03-31 DIAGNOSIS — B9689 Other specified bacterial agents as the cause of diseases classified elsewhere: Secondary | ICD-10-CM

## 2015-03-31 DIAGNOSIS — N760 Acute vaginitis: Principal | ICD-10-CM

## 2015-03-31 MED ORDER — METRONIDAZOLE 500 MG PO TABS: 500.0000 mg | ORAL_TABLET | Freq: Two times a day (BID) | ORAL | Status: DC

## 2015-03-31 NOTE — Telephone Encounter (Signed)
Pt left vm stating that she is going out of town tomorrow and that she has bv again.  She requested rx for flagyl.  Sent to pharm per [place].

## 2015-04-10 ENCOUNTER — Emergency Department
Admission: EM | Admit: 2015-04-10 | Discharge: 2015-04-10 | Disposition: A | Discharge status: Home / Self Care | Payer: BLUE CROSS/BLUE SHIELD | Source: Home / Self Care | Attending: Family Medicine | Admitting: Family Medicine

## 2015-04-10 ENCOUNTER — Encounter: Payer: Self-pay | Admitting: Emergency Medicine

## 2015-04-10 DIAGNOSIS — H00013 Hordeolum externum right eye, unspecified eyelid: Secondary | ICD-10-CM | POA: Diagnosis not present

## 2015-04-10 MED ORDER — ERYTHROMYCIN 5 MG/GM OP OINT: TOPICAL_OINTMENT | OPHTHALMIC | Status: DC

## 2015-04-10 NOTE — ED Notes (Signed)
Pt c/o possible cyst on right eye x2 weeks but getting larger and more painful. Attempted vision exam but pt wears contacts and does not have them today.

## 2015-04-10 NOTE — ED Provider Notes (Signed)
CSN: [id number]     Arrival date & time 04/10/15  1250 History   First MD Initiated Contact with Patient 04/10/15 1304     Chief Complaint  Patient presents with  . Eye Problem   (Consider location/radiation/quality/duration/timing/severity/associated sxs/prior Treatment) HPI Pt is a 34yo female presenting to [facility] with c/o possible cyst on Right lower eyelid for 2 weeks, gradually worsening.  Pt notes she has a red, swollen, tender area on the lateral corner of her Right eye.  Pt states it seems to be getting bigger and more painful. She has tried warm compresses w/o relief.  Pain is mild to moderate in severity, worse when she touches the area.  Pt normally wears contacts but states she does not have them in today.  Denies pain or itching the the eyeball itself. Denies fever, cough, congestion.  No trauma to the eye.  When contacts or in she has not noticed in visual changes.    Past Medical History  Diagnosis Date  . Hypertension   . Hypercholesterolemia   . Other specified iron deficiency anemias 03/17/2013  . IBS (irritable bowel syndrome)    Past Surgical History  Procedure Laterality Date  . Cesarean section     History reviewed. No pertinent family history. Social History  Substance Use Topics  . Smoking status: Never Smoker   . Smokeless tobacco: Never Used     Comment: never used tobacco  . Alcohol Use: No   OB History    Gravida Para Term Preterm AB TAB SAB Ectopic Multiple Living   3 3 3  0 0 0 0 0 0 3     Review of Systems  Constitutional: Negative for fever and chills.  HENT: Negative for congestion, postnasal drip, rhinorrhea and sinus pressure.   Eyes: Positive for pain, redness and itching. Negative for photophobia, discharge and visual disturbance.       Right lower eyelid     Allergies  Ampicillin and Xigduo xr  Home Medications   Prior to Admission medications   Medication Sig Start Date End Date Taking? Authorizing Provider  Adapalene-Benzoyl  Peroxide 0.1-2.5 % gel Apply 1 application topically every morning.    Historical Provider, MD  azaTHIOprine (IMURAN) 50 MG tablet Take 1 tablet (50 mg total) by mouth daily. 06/09/14   [name], PA-C  dicyclomine (BENTYL) 10 MG capsule Take 1 capsule (10 mg total) by mouth 2 (two) times daily. 11/14/13   [name], PA-C  diphenoxylate-atropine (LOMOTIL) 2.5-0.025 MG per tablet Take 2 tablets by mouth 4 times a day as needed for diarrhea or loose stools 08/14/14   [name], PA-C  doxycycline (VIBRA-TABS) 100 MG tablet TAKE 1 TABLET (100 MG TOTAL) BY MOUTH 2 (TWO) TIMES DAILY. 12/31/14   Historical Provider, MD  erythromycin ophthalmic ointment Place a 1/2 inch ribbon of ointment into the lower eyelid TID for 5 days 04/10/15   Noland Fordyce, PA-C  fluticasone [facility]) 50 MCG/ACT nasal spray Place 2 sprays into the nose as needed. 10/14/12 06/11/14  [name], PA-C  HYDROcodone-acetaminophen (NORCO/VICODIN) 5-325 MG per tablet Take 1 tablet by mouth every 8 (eight) hours as needed for moderate pain. 02/03/15   [name], PA-C  metroNIDAZOLE (FLAGYL) 500 MG tablet Take 1 tablet (500 mg total) by mouth 2 (two) times daily. 03/31/15   [name], PA-C  ondansetron (ZOFRAN) 4 MG tablet Take 4 mg by mouth as needed for nausea. 02/07/13   [name], PA-C  simvastatin (ZOCOR) 40 MG tablet Take 1 tablet (40 mg total) by mouth every evening. 06/08/14   [name], PA-C  SUMAtriptan (IMITREX) 100 MG tablet Take 100 mg by mouth as needed for migraine. 02/07/13   [name], PA-C  telmisartan-hydrochlorothiazide (MICARDIS HCT) 80-12.5 MG per tablet Take 1 tablet by mouth daily. 02/16/15   [name], PA-C  triamcinolone cream (KENALOG) 0.1 % Apply 1 application topically 2 (two) times daily. 02/03/15   [name], PA-C   Meds Ordered and Administered this Visit  Medications - No data to display  BP 131/86 mmHg  Pulse 93  Temp(Src) 98.5 F (36.9 C) (Oral)  Wt  212 lb (96.163 kg)  SpO2 98%  LMP 03/17/2015 No data found.   Physical Exam  Constitutional: She is oriented to person, place, and time. She appears well-developed and well-nourished.  HENT:  Head: Normocephalic and atraumatic.  Nose: Nose normal.  Mouth/Throat: Uvula is midline, oropharynx is clear and moist and mucous membranes are normal.  Eyes: Conjunctivae and EOM are normal. Pupils are equal, round, and reactive to light. Right eye exhibits hordeolum. Right eye exhibits no discharge. Left eye exhibits no discharge. No scleral icterus.    Hordeolum on Right lower eyelid, lateral aspect. No periorbital edema or erythema. No bleeding or discharge.  Neck: Normal range of motion.  Cardiovascular: Normal rate.   Pulmonary/Chest: Effort normal.  Musculoskeletal: Normal range of motion.  Neurological: She is alert and oriented to person, place, and time.  Skin: Skin is warm and dry.  Psychiatric: She has a normal mood and affect. Her behavior is normal.  Nursing note and vitals reviewed.   ED Course  Procedures (including critical care time)  Labs Review Labs Reviewed - No data to display  Imaging Review No results found.    MDM   1. Hordeolum of right eye    Pt presenting to [facility] with worsening hordeolum of Right lower eyelid.   Warm compresses at home w/o relief. No evidence of periorbital cellulitis today Will tx erythromycin ointment with additional warm compresses.  F/u with Ophthalmologist in 3-4 days if not improving, sooner if worsening. Patient verbalized understanding and agreement with treatment plan.    Noland Fordyce, PA-C 04/10/15 1430

## 2015-04-10 NOTE — Discharge Instructions (Signed)
Stye A stye is a bump on your eyelid caused by a bacterial infection. A stye can form inside the eyelid (internal stye) or outside the eyelid (external stye). An internal stye may be caused by an infected oil-producing gland inside your eyelid. An external stye may be caused by an infection at the base of your eyelash (hair follicle). Styes are very common. Anyone can get them at any age. They usually occur in just one eye, but you may have more than one in either eye.  CAUSES  The infection is almost always caused by bacteria called Staphylococcus aureus. This is a common type of bacteria that lives on your skin. RISK FACTORS You may be at higher risk for a stye if you have had one before. You may also be at higher risk if you have:  Diabetes.  Long-term illness.  Long-term eye redness.  A skin condition called seborrhea.  High fat levels in your blood (lipids). SIGNS AND SYMPTOMS  Eyelid pain is the most common symptom of a stye. Internal styes are more painful than external styes. Other signs and symptoms may include:  Painful swelling of your eyelid.  A scratchy feeling in your eye.  Tearing and redness of your eye.  Pus draining from the stye. DIAGNOSIS  Your health care provider may be able to diagnose a stye just by examining your eye. The health care provider may also check to make sure:  You do not have a fever or other signs of a more serious infection.  The infection has not spread to other parts of your eye or areas around your eye. TREATMENT  Most styes will clear up in a few days without treatment. In some cases, you may need to use antibiotic drops or ointment to prevent infection. Your health care provider may have to drain the stye surgically if your stye is:  Large.  Causing a lot of pain.  Interfering with your vision. This can be done using a thin blade or a needle.  HOME CARE INSTRUCTIONS   Take medicines only as directed by your health care  provider.  Apply a clean, warm compress to your eye for 10 minutes, 4 times a day.  Do not wear contact lenses or eye makeup until your stye has healed.  Do not try to pop or drain the stye. SEEK MEDICAL CARE IF:  You have chills or a fever.  Your stye does not go away after several days.  Your stye affects your vision.  Your eyeball becomes swollen, red, or painful. MAKE SURE YOU:  Understand these instructions.  Will watch your condition.  Will get help right away if you are not doing well or get worse.   This information is not intended to replace advice given to you by your health care provider. Make sure you discuss any questions you have with your health care provider.   Document Released: 03/08/2005 Document Revised: 06/19/2014 Document Reviewed: 09/12/2013 Elsevier Interactive Patient Education 2016 [employer]  

## 2015-04-13 ENCOUNTER — Telehealth: Payer: Self-pay | Admitting: *Deleted

## 2015-06-02 ENCOUNTER — Ambulatory Visit: Payer: BLUE CROSS/BLUE SHIELD

## 2015-06-02 ENCOUNTER — Other Ambulatory Visit: Payer: BLUE CROSS/BLUE SHIELD

## 2015-06-02 ENCOUNTER — Ambulatory Visit: Payer: BLUE CROSS/BLUE SHIELD | Admitting: Hematology & Oncology

## 2015-06-09 ENCOUNTER — Other Ambulatory Visit: Payer: Self-pay | Admitting: Nurse Practitioner

## 2015-06-18 ENCOUNTER — Ambulatory Visit: Payer: BLUE CROSS/BLUE SHIELD | Admitting: Physician Assistant

## 2015-06-18 ENCOUNTER — Encounter: Payer: Self-pay | Admitting: Physician Assistant

## 2015-06-18 ENCOUNTER — Ambulatory Visit (INDEPENDENT_AMBULATORY_CARE_PROVIDER_SITE_OTHER): Payer: 59 | Admitting: Physician Assistant

## 2015-06-18 VITALS — BP 132/78 | HR 80 | Ht 64.0 in | Wt 220.0 lb

## 2015-06-18 DIAGNOSIS — D508 Other iron deficiency anemias: Secondary | ICD-10-CM

## 2015-06-18 DIAGNOSIS — F411 Generalized anxiety disorder: Secondary | ICD-10-CM | POA: Diagnosis not present

## 2015-06-18 DIAGNOSIS — B9689 Other specified bacterial agents as the cause of diseases classified elsewhere: Secondary | ICD-10-CM

## 2015-06-18 DIAGNOSIS — N760 Acute vaginitis: Secondary | ICD-10-CM

## 2015-06-18 DIAGNOSIS — G4700 Insomnia, unspecified: Secondary | ICD-10-CM

## 2015-06-18 DIAGNOSIS — A499 Bacterial infection, unspecified: Secondary | ICD-10-CM

## 2015-06-18 LAB — WET PREP FOR TRICH, YEAST, CLUE
Clue Cells Wet Prep HPF POC: NONE SEEN
Trich, Wet Prep: NONE SEEN
Yeast Wet Prep HPF POC: NONE SEEN

## 2015-06-18 MED ORDER — ALPRAZOLAM 0.25 MG PO TABS: ORAL_TABLET | ORAL | Status: DC

## 2015-06-18 MED ORDER — TRAZODONE HCL 50 MG PO TABS: 25.0000 mg | ORAL_TABLET | Freq: Every evening | ORAL | Status: DC | PRN

## 2015-06-18 MED ORDER — METRONIDAZOLE 0.75 % EX GEL: CUTANEOUS | Status: DC

## 2015-06-18 MED ORDER — METRONIDAZOLE 500 MG PO TABS: 500.0000 mg | ORAL_TABLET | Freq: Two times a day (BID) | ORAL | Status: DC

## 2015-06-18 NOTE — Patient Instructions (Signed)

## 2015-06-19 LAB — COMPLETE METABOLIC PANEL WITH GFR
ALT: 32 U/L — ABNORMAL HIGH (ref 6–29)
AST: 27 U/L (ref 10–30)
Albumin: 4.1 g/dL (ref 3.6–5.1)
Alkaline Phosphatase: 55 U/L (ref 33–115)
BUN: 7 mg/dL (ref 7–25)
CALCIUM: 9.7 mg/dL (ref 8.6–10.2)
CHLORIDE: 103 mmol/L (ref 98–110)
CO2: 28 mmol/L (ref 20–31)
Creat: 0.67 mg/dL (ref 0.50–1.10)
GFR, Est African American: >89 mL/min (ref >=60)
GFR, Est Non African American: >89 mL/min (ref >=60)
GLUCOSE: 73 mg/dL (ref 65–99)
POTASSIUM: 4 mmol/L (ref 3.5–5.3)
SODIUM: 140 mmol/L (ref 135–146)
Total Bilirubin: 0.5 mg/dL (ref 0.2–1.2)
Total Protein: 7.3 g/dL (ref 6.1–8.1)

## 2015-06-19 LAB — LIPID PANEL
CHOL/HDL RATIO: 4.4 ratio (ref <=5.0)
Cholesterol: 225 mg/dL — ABNORMAL HIGH (ref 125–200)
HDL: 51 mg/dL (ref >=46)
LDL CALC: 154 mg/dL — AB (ref <130)
TRIGLYCERIDES: 101 mg/dL (ref <150)
VLDL: 20 mg/dL (ref <30)

## 2015-06-20 DIAGNOSIS — G4700 Insomnia, unspecified: Secondary | ICD-10-CM | POA: Insufficient documentation

## 2015-06-20 NOTE — Progress Notes (Signed)
   Subjective:    Patient ID: Stacie Cardenas, female    DOB: [birth year], 35 y.o.   MRN: [id number]  HPI Patient is a 35 year old female who presents to the clinic with what she feels like is recurrent bacterial vaginosis. She has tested positive in office twice before. She feels like after every. She has the same symptoms of a sheet odor and discharge. She wonders why she keeps getting these infections. They do resolve with metronidazole but come back. She denies any pain. She has not tried anything else to prevent these. She does wear cotton panties and does not successively clamped or douche.  Patient does have a history of anemia. She would like her hemoglobin checked today.  Patient is also having increasing problem with anxiety. She denies being able to sleep at night. She feels like not sleeping contributes to her anxiety during the day. She feels like everything gets on her nerves and she is just on edge. In the past she was given Xanax to take as needed for a few months. This helped tremendously. She has not been on any other long-term daily medications for anxiety. There is no new stressors just life and work. She denies any suicidal or homicidal thoughts.   Review of Systems  All other systems reviewed and are negative.      Objective:   Physical Exam  Constitutional: She is oriented to person, place, and time. She appears well-developed and well-nourished.  HENT:  Head: Normocephalic and atraumatic.  Cardiovascular: Normal rate, regular rhythm and normal heart sounds.   Pulmonary/Chest: Effort normal and breath sounds normal.  Neurological: She is alert and oriented to person, place, and time.  Psychiatric: She has a normal mood and affect. Her behavior is normal.          Assessment & Plan:  Bacterial vaginosis-patient does have history of positive BV in wet prep. Stat wet prep performed today. I did go ahead and send metronidazole due to patient's history. She can take  this and then start 4-6 month preventative therapy. Patient was reeducated on what that-year-old vaginosis is. Other more natural prevention was discussed. Follow-up as needed.  Anemia-patient has a history of anemia. CBC added today. Patient has not gotten her other fasting labs done therefore we'll reprinted out lab slip today to have them all done.  Anxiety/insomnia-some of her issues could be coming from not sleeping. Trazodone since the pharmacy to use at bedtime. Side effects discussed. Follow-up in the next 1-2 months. Patient has had Xanax for 4 to use as needed. I did go ahead and give her a small quantity to use only as needed. Side effects discussed. Abuse potential discussed. If finding that she continues to have to use benzodiazepines we should discuss a daily option. Patient did not want to start a daily medication today.

## 2015-06-21 ENCOUNTER — Other Ambulatory Visit: Payer: Self-pay | Admitting: *Deleted

## 2015-06-21 DIAGNOSIS — D508 Other iron deficiency anemias: Secondary | ICD-10-CM

## 2015-06-22 ENCOUNTER — Ambulatory Visit (HOSPITAL_BASED_OUTPATIENT_CLINIC_OR_DEPARTMENT_OTHER): Payer: 59 | Admitting: Hematology & Oncology

## 2015-06-22 ENCOUNTER — Other Ambulatory Visit (HOSPITAL_BASED_OUTPATIENT_CLINIC_OR_DEPARTMENT_OTHER): Payer: 59

## 2015-06-22 ENCOUNTER — Encounter: Payer: Self-pay | Admitting: Hematology & Oncology

## 2015-06-22 ENCOUNTER — Ambulatory Visit: Payer: BLUE CROSS/BLUE SHIELD

## 2015-06-22 VITALS — BP 140/82 | HR 82 | Temp 98.0°F | Resp 16 | Ht 64.0 in | Wt 220.0 lb

## 2015-06-22 DIAGNOSIS — D509 Iron deficiency anemia, unspecified: Secondary | ICD-10-CM | POA: Diagnosis not present

## 2015-06-22 DIAGNOSIS — D508 Other iron deficiency anemias: Secondary | ICD-10-CM

## 2015-06-22 DIAGNOSIS — D518 Other vitamin B12 deficiency anemias: Secondary | ICD-10-CM

## 2015-06-22 DIAGNOSIS — D649 Anemia, unspecified: Secondary | ICD-10-CM

## 2015-06-22 DIAGNOSIS — K5090 Crohn's disease, unspecified, without complications: Secondary | ICD-10-CM

## 2015-06-22 LAB — CBC WITH DIFFERENTIAL (CANCER CENTER ONLY)
BASO#: 0 10*3/uL (ref 0.0–0.2)
BASO%: 0.2 % (ref 0.0–2.0)
EOS%: 1 % (ref 0.0–7.0)
Eosinophils Absolute: 0.1 10*3/uL (ref 0.0–0.5)
HEMATOCRIT: 43.2 % (ref 34.8–46.6)
HGB: 14 g/dL (ref 11.6–15.9)
LYMPH#: 3 10*3/uL (ref 0.9–3.3)
LYMPH%: 36.5 % (ref 14.0–48.0)
MCH: 29.2 pg (ref 26.0–34.0)
MCHC: 32.4 g/dL (ref 32.0–36.0)
MCV: 90 fL (ref 81–101)
MONO#: 0.5 10*3/uL (ref 0.1–0.9)
MONO%: 6.1 % (ref 0.0–13.0)
NEUT#: 4.6 10*3/uL (ref 1.5–6.5)
NEUT%: 56.2 % (ref 39.6–80.0)
PLATELETS: 207 10*3/uL (ref 145–400)
RBC: 4.79 10*6/uL (ref 3.70–5.32)
RDW: 13.1 % (ref 11.1–15.7)
WBC: 8.2 10*3/uL (ref 3.9–10.0)

## 2015-06-22 LAB — IRON AND TIBC
%SAT: 39 % (ref 21–57)
IRON: 87 ug/dL (ref 41–142)
TIBC: 225 ug/dL — AB (ref 236–444)
UIBC: 138 ug/dL (ref 120–384)

## 2015-06-22 LAB — FERRITIN: Ferritin: 344 ng/ml — ABNORMAL HIGH (ref 9–269)

## 2015-06-22 NOTE — Progress Notes (Signed)
Hematology and Oncology Follow Up Visit  PAGE PUCCIARELLI [id number] 11-19-1980 34 y.o. 06/22/2015   Principle Diagnosis:  Iron deficiency anemia Crohn's disease Menometrorrhagia  Current Therapy:   IV iron as indicated-patient last received a dose in September 2016    Interim History: Stacie Cardenas is here today for a follow-up. She is doing ok. She feels better. Her Crohn's disease is doing okay. I believe that she is on Remicade and Imuran for this.  She did get iron back in September. This made her feel better. She does feel a little tired today.  [name] not noted any obvious bright red blood per rectum.  Her appetite has been doing well. [name] had no problems with leg swelling. [name] had no joint issues. Had no rashes. [name] had no diarrhea. [name] had no fever.  Overall, her point status is ECOG 1. .   Medications:    Medication List       This list is accurate as of: 06/22/15 11:06 AM.  Always use your most recent med list.               Adapalene-Benzoyl Peroxide 0.1-2.5 % gel  Apply 1 application topically every morning.     ALPRAZolam 0.25 MG tablet  Commonly known as:  XANAX  Once daily as needed for acute anxiety.     azaTHIOprine 50 MG tablet  Commonly known as:  IMURAN  Take 1 tablet (50 mg total) by mouth daily.     dicyclomine 10 MG capsule  Commonly known as:  BENTYL  Take 1 capsule (10 mg total) by mouth 2 (two) times daily.     diphenoxylate-atropine 2.5-0.025 MG tablet  Commonly known as:  LOMOTIL  Take 2 tablets by mouth 4 times a day as needed for diarrhea or loose stools     erythromycin ophthalmic ointment  Place a 1/2 inch ribbon of ointment into the lower eyelid TID for 5 days     fluticasone 50 MCG/ACT nasal spray  Commonly known as:  FLONASE  Place 2 sprays into the nose as needed.     HYDROcodone-acetaminophen 5-325 MG tablet  Commonly known as:  NORCO/VICODIN  Take 1 tablet by mouth every 8 (eight) hours as needed for moderate pain.       metroNIDAZOLE 0.75 % gel  Commonly known as:  METROGEL  1 application to the vagina twice a week for 4-6 months.     metroNIDAZOLE 500 MG tablet  Commonly known as:  FLAGYL  Take 1 tablet (500 mg total) by mouth 2 (two) times daily.     ondansetron 4 MG tablet  Commonly known as:  ZOFRAN  Take 4 mg by mouth as needed for nausea.     predniSONE 10 MG tablet  Commonly known as:  DELTASONE  Take 40 mg (4 pills) daily for 7 days, then take 30 mg (3 pills) daily for 7 days, then take 98m (2 pills) daily for 7 days, then take 129m(1 pill) for 7 days, then take 26m28m1/2 pill) for 7 days, then stop.     simvastatin 40 MG tablet  Commonly known as:  ZOCOR  Take 1 tablet (40 mg total) by mouth every evening.     SUMAtriptan 100 MG tablet  Commonly known as:  IMITREX  Take 100 mg by mouth as needed for migraine.     telmisartan-hydrochlorothiazide 80-12.5 MG tablet  Commonly known as:  MICARDIS HCT  Take 1 tablet by mouth daily.  traZODone 50 MG tablet  Commonly known as:  DESYREL  Take 0.5-1 tablets (25-50 mg total) by mouth at bedtime as needed for sleep.     triamcinolone cream 0.1 %  Commonly known as:  KENALOG  Apply 1 application topically 2 (two) times daily.        Allergies:  Allergies  Allergen Reactions  . Ampicillin Diarrhea  . Xigduo Xr [Dapagliflozin-Metformin Hcl Er]     Yeast infections.     Past Medical History, Surgical history, Social history, and Family History were reviewed and updated.  Review of Systems: All other 10 point review of systems is negative.   Physical Exam:  height is 5' 4"  (1.626 m) and weight is 220 lb (99.791 kg). Her oral temperature is 98 F (36.7 C). Her blood pressure is 140/82 and her pulse is 82. Her respiration is 16.   Wt Readings from Last 3 Encounters:  06/22/15 220 lb (99.791 kg)  06/18/15 220 lb (99.791 kg)  04/10/15 212 lb (96.163 kg)    Ocular: Sclerae unicteric, pupils equal, round and reactive to  light Ear-nose-throat: Oropharynx clear, dentition fair Lymphatic: No cervical or supraclavicular adenopathy Lungs no rales or rhonchi, good excursion bilaterally Heart regular rate and rhythm, no murmur appreciated Abd soft, nontender, positive bowel sounds MSK no focal spinal tenderness, no joint edema Neuro: non-focal, well-oriented, appropriate affect Breasts: Deferred  Lab Results  Component Value Date   WBC 8.2 06/22/2015   HGB 14.0 06/22/2015   HCT 43.2 06/22/2015   MCV 90 06/22/2015   PLT 207 06/22/2015   Lab Results  Component Value Date   FERRITIN 307* 03/08/2015   IRON 65 03/08/2015   TIBC 230* 03/08/2015   UIBC 165 03/08/2015   IRONPCTSAT 28 03/08/2015   Lab Results  Component Value Date   RETICCTPCT 2.0 03/08/2015   RBC 4.79 06/22/2015   RETICCTABS 95.8 03/08/2015   No results found for: KPAFRELGTCHN, LAMBDASER, KAPLAMBRATIO No results found for: IGGSERUM, IGA, IGMSERUM No results found for: Odetta Pink, SPEI   Chemistry      Component Value Date/Time   NA 140 06/18/2015 1442   K 4.0 06/18/2015 1442   CL 103 06/18/2015 1442   CO2 28 06/18/2015 1442   BUN 7 06/18/2015 1442   CREATININE 0.67 06/18/2015 1442   CREATININE 0.78 04/27/2010 2349      Component Value Date/Time   CALCIUM 9.7 06/18/2015 1442   ALKPHOS 55 06/18/2015 1442   AST 27 06/18/2015 1442   ALT 32* 06/18/2015 1442   BILITOT 0.5 06/18/2015 1442     Impression and Plan: Stacie Cardenas is a 35 year old African female with a history of iron deficiency.I wonder she also has some B-12 deficiency from the Crohn's disease. We did check a B-12 level on her back in March and this was okay. I will repeat level.  We will see what her iron studies show today. I would be very surprised if her iron levels are low.   I think we can probably in her back to see [place] in another 3 or 4 months.   Stacie Napoleon, MD 1/10/201711:06 AM

## 2015-06-23 LAB — VITAMIN B12: Vitamin B12: 1366 pg/mL — ABNORMAL HIGH (ref 211–946)

## 2015-06-23 LAB — RETICULOCYTES: Reticulocyte Count: 1.9 % (ref 0.6–2.6)

## 2015-06-29 ENCOUNTER — Other Ambulatory Visit: Payer: BLUE CROSS/BLUE SHIELD

## 2015-06-29 ENCOUNTER — Ambulatory Visit: Payer: BLUE CROSS/BLUE SHIELD | Admitting: Hematology & Oncology

## 2015-09-06 ENCOUNTER — Telehealth: Payer: Self-pay | Admitting: *Deleted

## 2015-09-06 NOTE — Telephone Encounter (Signed)
Pt left a vm this afternoon stating that she has a few personal questions and wanted to know if you could call her back personally.

## 2015-09-07 ENCOUNTER — Ambulatory Visit (INDEPENDENT_AMBULATORY_CARE_PROVIDER_SITE_OTHER): Payer: 59 | Admitting: Physician Assistant

## 2015-09-07 ENCOUNTER — Encounter: Payer: Self-pay | Admitting: Physician Assistant

## 2015-09-07 VITALS — BP 150/95 | HR 75 | Ht 64.0 in | Wt 224.0 lb

## 2015-09-07 DIAGNOSIS — K50111 Crohn's disease of large intestine with rectal bleeding: Secondary | ICD-10-CM | POA: Diagnosis not present

## 2015-09-07 DIAGNOSIS — I10 Essential (primary) hypertension: Secondary | ICD-10-CM

## 2015-09-07 DIAGNOSIS — E669 Obesity, unspecified: Secondary | ICD-10-CM

## 2015-09-07 DIAGNOSIS — D518 Other vitamin B12 deficiency anemias: Secondary | ICD-10-CM

## 2015-09-07 DIAGNOSIS — R1031 Right lower quadrant pain: Secondary | ICD-10-CM | POA: Diagnosis not present

## 2015-09-07 DIAGNOSIS — R1032 Left lower quadrant pain: Secondary | ICD-10-CM

## 2015-09-07 DIAGNOSIS — D508 Other iron deficiency anemias: Secondary | ICD-10-CM

## 2015-09-07 DIAGNOSIS — D649 Anemia, unspecified: Secondary | ICD-10-CM | POA: Insufficient documentation

## 2015-09-07 LAB — CBC WITH DIFFERENTIAL/PLATELET
BASOS ABS: 0 10*3/uL (ref 0.0–0.1)
Basophils Relative: 0 % (ref 0–1)
EOS PCT: 1 % (ref 0–5)
Eosinophils Absolute: 0.1 10*3/uL (ref 0.0–0.7)
HEMATOCRIT: 39.1 % (ref 36.0–46.0)
Hemoglobin: 13.3 g/dL (ref 12.0–15.0)
Lymphocytes Relative: 40 % (ref 12–46)
Lymphs Abs: 3.5 10*3/uL (ref 0.7–4.0)
MCH: 28.9 pg (ref 26.0–34.0)
MCHC: 34 g/dL (ref 30.0–36.0)
MCV: 84.8 fL (ref 78.0–100.0)
MPV: 11.3 fL (ref 8.6–12.4)
Monocytes Absolute: 0.7 10*3/uL (ref 0.1–1.0)
Monocytes Relative: 8 % (ref 3–12)
Neutro Abs: 4.5 10*3/uL (ref 1.7–7.7)
Neutrophils Relative %: 51 % (ref 43–77)
Platelets: 254 10*3/uL (ref 150–400)
RBC: 4.61 MIL/uL (ref 3.87–5.11)
RDW: 13.4 % (ref 11.5–15.5)
WBC: 8.8 10*3/uL (ref 4.0–10.5)

## 2015-09-07 MED ORDER — PREDNISONE 10 MG PO TABS: ORAL_TABLET | ORAL | Status: DC

## 2015-09-07 MED ORDER — HYDROCODONE-ACETAMINOPHEN 5-325 MG PO TABS: 1.0000 | ORAL_TABLET | Freq: Three times a day (TID) | ORAL | Status: DC | PRN

## 2015-09-07 NOTE — Telephone Encounter (Signed)
Called patient. She is having some right side lower abdominal pain with loose stools and blood in stool. Has chrons. She wanted to see where to go. I told her to follow up with chrons doctor. If she cannot get in a timely manner make appt with me to check out everything.

## 2015-09-07 NOTE — Patient Instructions (Signed)
Crohn Disease Crohn disease is a long-lasting (chronic) disease that affects your gastrointestinal (GI) tract. It often causes irritation and swelling (inflammation) in your small intestine and the beginning of your large intestine. However, it can affect any part of your GI tract. Crohn disease is part of a group of illnesses that are known as inflammatory bowel disease (IBD). Crohn disease may start slowly and get worse over time. Symptoms may come and go. They may also disappear for months or even years at a time (remission). CAUSES The exact cause of Crohn disease is not known. It may be a response that causes your body's defense system (immune system) to mistakenly attack healthy cells and tissues (autoimmune response). Your genes and your environment may also play a role. RISK FACTORS You may be at greater risk for Crohn disease if you:  Have other family members with Crohn disease or another IBD.  Use any tobacco products, including cigarettes, chewing tobacco, or electronic cigarettes.  Are in your 76s.  Have [place] European ancestry. SIGNS AND SYMPTOMS The main signs and symptoms of Crohn disease involve your GI tract. These include:  Diarrhea.  Rectal bleeding.  An urgent need to move your bowels.  The feeling that you are not finished having a bowel movement.  Abdominal pain or cramping.  Constipation. General signs and symptoms of Crohn disease may also include:  Unexplained weight loss.  Fatigue.  Fever.  Nausea.  Loss of appetite.  Joint pain  Changes in vision.  Red bumps on your skin. DIAGNOSIS Your health care provider may suspect Crohn disease based on your symptoms and your medical history. Your health care provider will do a physical exam. You may need to see a health care provider who specializes in diseases of the digestive tract (gastroenterologist). You may also have tests to help your health care providers make a diagnosis. These may  include:  Blood tests.  Stool sample tests.  Imaging tests, such as X-rays and CT scans.  Tests to examine the inside of your intestines using a long, flexible tube that has a light and a camera on the end (endoscopy or colonoscopy).  A procedure to take tissue samples from inside your bowel (biopsy) to be examined under a microscope. TREATMENT  There is no cure for Crohn disease. Treatment will focus on managing your symptoms. Crohn disease affects each person differently. Your treatment may include:  Resting your bowels. Drinking only clear liquids or getting nutrition through an IV for a period of time gives your bowels a chance to heal because they are not passing stools.  Medicines. These may be used alone or in combination (combination therapy). These may include antibiotic medicines. You may be given medicines that help to:  Reduce inflammation.  Control your immune system activity.  Fight infections.  Relieve cramps and prevent diarrhea.  Control your pain.  Surgery. You may need surgery if:  Medicines and other treatments are no longer working.  You develop complications from severe Crohn disease.  A section of your intestine becomes so damaged that it needs to be removed. HOME CARE INSTRUCTIONS  Take medicines only as directed by your health care provider.  If you were prescribed an antibiotic medicine, finish it all even if you start to feel better.  Keep all follow-up visits as directed by your health care provider. This is important.  Talk with your health care provider about changing your diet. This may help your symptoms. Your health care provide may recommend changes, such  as:  Drinking more fluids.  Avoiding milk and other foods that contain lactose.  Eating a low-fat diet.  Avoiding high-fiber foods, such as popcorn and nuts.  Avoiding carbonated beverages, such as soda.  Eating smaller meals more often rather than eating large  meals.  Keeping a food diary to identify foods that make your symptoms better or worse.  Do not use any tobacco products, including cigarettes, chewing tobacco, or electronic cigarettes. If you need help quitting, ask your health care provider.  Limit alcohol intake to no more than 1 drink per day for nonpregnant women and 2 drinks per day for men. One drink equals 12 ounces of beer, 5 ounces of wine, or 1 ounces of hard liquor.  Exercise daily or as directed by your health care provider. SEEK MEDICAL CARE IF:  You have diarrhea, abdominal cramps, and other gastrointestinal problems that are present almost all of the time.  Your symptoms do not improve with treatment.  You continue to lose weight.  You develop a rash or sores on your skin.  You develop eye problems.  You have a fever.   Your symptoms get worse.  You develop new symptoms. SEEK IMMEDIATE MEDICAL CARE IF:  You have bloody diarrhea.  You develop severe abdominal pain.  You cannot pass stools.   This information is not intended to replace advice given to you by your health care provider. Make sure you discuss any questions you have with your health care provider.   Document Released: 03/08/2005 Document Revised: 06/19/2014 Document Reviewed: 01/14/2014 Elsevier Interactive Patient Education [employer].

## 2015-09-07 NOTE — Progress Notes (Signed)
   Subjective:    Patient ID: Stacie Cardenas, female    DOB: 06/15/1980, 35 y.o.   MRN: [id number]  HPI  Pt is a 35 yo female who presents to the clinic with bilateral lower abdominal pain more on right than left for 6 days with hematochezia and loose stools. She reports 4-5 loose stools a day.Seems to be getting progressively worse. She has crohns disease and previously consider in remission with remicade injections and azathioprine .she called GI and they could not see her and said to go to ER. She cannot afford co-pay for ER. She has tried aleve, ibuprofen, tramadol for pain with no relief. She denies any nausea or vomiting. No fever, chills or body aches. She admits to eating whatever she likes. She recently had a lot of ice cream with nuts in it last week. She knows she needs to lose weight.  She did not remember BP medication this am.    Review of Systems  All other systems reviewed and are negative.      Objective:   Physical Exam  Constitutional: She is oriented to person, place, and time. She appears well-developed and well-nourished.  HENT:  Head: Normocephalic and atraumatic.  Pulmonary/Chest: Effort normal and breath sounds normal.  No CVA tenderness.  Abdominal: Soft. Bowel sounds are normal. She exhibits distension. She exhibits no mass. There is tenderness. There is no rebound and no guarding.  Tenderness over left and right lower quadrant to palpation. No rebound or guarding.  Negative psoas sign.  Neurological: She is alert and oriented to person, place, and time.  Skin: Skin is dry.  Psychiatric: She has a normal mood and affect. Her behavior is normal.          Assessment & Plan:  Hypertension- encouraged pt to take BP medications daily. Will continue to monitor.  crohn's flare/obesity- CBC STAT with normal WBC and diff. I do not think pt has infection with flare. Will only treat with prednisone. Follow up with digestive health in 1 week. Next remicade injection  is April 17th, 2017. Discussed with patient stress and diet triggers. I do think trigger could have come from recent nut ingestion or overall bad diet. Will make nutrition referral.

## 2015-10-21 ENCOUNTER — Ambulatory Visit (HOSPITAL_BASED_OUTPATIENT_CLINIC_OR_DEPARTMENT_OTHER): Payer: 59 | Admitting: Family

## 2015-10-21 ENCOUNTER — Other Ambulatory Visit (HOSPITAL_BASED_OUTPATIENT_CLINIC_OR_DEPARTMENT_OTHER): Payer: 59

## 2015-10-21 ENCOUNTER — Encounter: Payer: Self-pay | Admitting: Family

## 2015-10-21 VITALS — BP 156/99 | HR 72 | Temp 98.1°F | Resp 16 | Ht 64.0 in | Wt 226.0 lb

## 2015-10-21 DIAGNOSIS — K5090 Crohn's disease, unspecified, without complications: Secondary | ICD-10-CM

## 2015-10-21 DIAGNOSIS — D500 Iron deficiency anemia secondary to blood loss (chronic): Secondary | ICD-10-CM | POA: Diagnosis not present

## 2015-10-21 DIAGNOSIS — N920 Excessive and frequent menstruation with regular cycle: Secondary | ICD-10-CM | POA: Diagnosis not present

## 2015-10-21 DIAGNOSIS — D508 Other iron deficiency anemias: Secondary | ICD-10-CM

## 2015-10-21 DIAGNOSIS — D518 Other vitamin B12 deficiency anemias: Secondary | ICD-10-CM

## 2015-10-21 LAB — FERRITIN: Ferritin: 236 ng/ml (ref 9–269)

## 2015-10-21 LAB — CBC WITH DIFFERENTIAL (CANCER CENTER ONLY)
BASO#: 0 10*3/uL (ref 0.0–0.2)
BASO%: 0.1 % (ref 0.0–2.0)
EOS ABS: 0.1 10*3/uL (ref 0.0–0.5)
EOS%: 0.7 % (ref 0.0–7.0)
HCT: 39.8 % (ref 34.8–46.6)
HEMOGLOBIN: 13.3 g/dL (ref 11.6–15.9)
LYMPH#: 3.5 10*3/uL — ABNORMAL HIGH (ref 0.9–3.3)
LYMPH%: 40.9 % (ref 14.0–48.0)
MCH: 29.4 pg (ref 26.0–34.0)
MCHC: 33.4 g/dL (ref 32.0–36.0)
MCV: 88 fL (ref 81–101)
MONO#: 0.5 10*3/uL (ref 0.1–0.9)
MONO%: 6.2 % (ref 0.0–13.0)
NEUT%: 52.1 % (ref 39.6–80.0)
NEUTROS ABS: 4.4 10*3/uL (ref 1.5–6.5)
Platelets: 242 10*3/uL (ref 145–400)
RBC: 4.52 10*6/uL (ref 3.70–5.32)
RDW: 12.7 % (ref 11.1–15.7)
WBC: 8.5 10*3/uL (ref 3.9–10.0)

## 2015-10-21 LAB — IRON AND TIBC
%SAT: 23 % (ref 21–57)
Iron: 52 ug/dL (ref 41–142)
TIBC: 229 ug/dL — AB (ref 236–444)
UIBC: 177 ug/dL (ref 120–384)

## 2015-10-21 LAB — CHCC SATELLITE - SMEAR

## 2015-10-21 NOTE — Progress Notes (Signed)
Hematology and Oncology Follow Up Visit  Stacie Cardenas [id number] [birth year] 35 y.o. 10/21/2015   Principle Diagnosis:  Iron deficiency anemia Crohn's disease Menometrorrhagia  Current Therapy:   IV iron as indicated - last received September 2016     Interim History: Stacie Cardenas is here today for a follow-up. She is feeling fatigued and chewing lots of ice. She states that recent testing with GI showed she was low in vitamin D. She is now taking Vit D 50,000 units once a week.  Her last dose of Feraheme was in  September and she had a nice response. Her Hgb is stable at 13.3 with an MCV of 88.  She has not had a recent flare with her Chron's. She receives Remicade every 8 weeks and takes Imuran  No fever, chills, n/v, cough, rash, headache, dizziness, SOB, chest pain, palpitations, abdominal pain or changes in bowel or bladder habits.  Her cycles are still heavy. No other bleeding or bruising. No lymphadenopathy found on exam.  No swelling, tenderness, numbness or tingling in her extremities.   Medications:    Medication List       This list is accurate as of: 10/21/15 10:54 AM.  Always use your most recent med list.               Adapalene-Benzoyl Peroxide 0.1-2.5 % gel  Apply 1 application topically every morning.     ALPRAZolam 0.25 MG tablet  Commonly known as:  XANAX  Once daily as needed for acute anxiety.     azaTHIOprine 50 MG tablet  Commonly known as:  IMURAN  Take 1 tablet (50 mg total) by mouth daily.     dicyclomine 20 MG tablet  Commonly known as:  BENTYL  Take 20 mg by mouth 3 (three) times daily.     fluticasone 50 MCG/ACT nasal spray  Commonly known as:  FLONASE  Place 2 sprays into the nose as needed.     REMICADE 100 MG injection  Generic drug:  inFLIXimab  Inject into the vein every 8 (eight) weeks.     simvastatin 40 MG tablet  Commonly known as:  ZOCOR  Take 1 tablet (40 mg total) by mouth every evening.     SUMAtriptan 100 MG tablet    Commonly known as:  IMITREX  Take 100 mg by mouth as needed for migraine.     telmisartan-hydrochlorothiazide 80-12.5 MG tablet  Commonly known as:  MICARDIS HCT  Take 1 tablet by mouth daily.     traZODone 50 MG tablet  Commonly known as:  DESYREL  Take 0.5-1 tablets (25-50 mg total) by mouth at bedtime as needed for sleep.     triamcinolone cream 0.1 %  Commonly known as:  KENALOG  Apply 1 application topically 2 (two) times daily.     Vitamin D (Ergocalciferol) 50000 units Caps capsule  Commonly known as:  DRISDOL  TAKE 1 CAPSULE BY MOUTH ONCE A WEEK FOR X 8 WEEK        Allergies:  Allergies  Allergen Reactions  . Ampicillin Diarrhea  . Xigduo Xr [Dapagliflozin-Metformin Hcl Er]     Yeast infections.     Past Medical History, Surgical history, Social history, and Family History were reviewed and updated.  Review of Systems: All other 10 point review of systems is negative.   Physical Exam:  height is 5' 4"  (1.626 m) and weight is 226 lb (102.513 kg). Her oral temperature is 98.1 F (36.7 C). Her blood  pressure is 156/99 and her pulse is 72. Her respiration is 16.   Wt Readings from Last 3 Encounters:  10/21/15 226 lb (102.513 kg)  09/07/15 224 lb (101.606 kg)  06/22/15 220 lb (99.791 kg)    Ocular: Sclerae unicteric, pupils equal, round and reactive to light Ear-nose-throat: Oropharynx clear, dentition fair Lymphatic: No cervical supraclavicular or axillary adenopathy Lungs no rales or rhonchi, good excursion bilaterally Heart regular rate and rhythm, no murmur appreciated Abd soft, nontender, positive bowel sounds, no liver or spleen tip palpated on exam, no fluid wave MSK no focal spinal tenderness, no joint edema Neuro: non-focal, well-oriented, appropriate affect Breasts: Deferred  Lab Results  Component Value Date   WBC 8.5 10/21/2015   HGB 13.3 10/21/2015   HCT 39.8 10/21/2015   MCV 88 10/21/2015   PLT 242 10/21/2015   Lab Results  Component  Value Date   FERRITIN 344* 06/22/2015   IRON 87 06/22/2015   TIBC 225* 06/22/2015   UIBC 138 06/22/2015   IRONPCTSAT 39 06/22/2015   Lab Results  Component Value Date   RETICCTPCT 2.0 03/08/2015   RBC 4.52 10/21/2015   RETICCTABS 95.8 03/08/2015   No results found for: KPAFRELGTCHN, LAMBDASER, KAPLAMBRATIO No results found for: IGGSERUM, IGA, IGMSERUM No results found for: Odetta Pink, SPEI   Chemistry      Component Value Date/Time   NA 140 06/18/2015 1442   K 4.0 06/18/2015 1442   CL 103 06/18/2015 1442   CO2 28 06/18/2015 1442   BUN 7 06/18/2015 1442   CREATININE 0.67 06/18/2015 1442   CREATININE 0.78 04/27/2010 2349      Component Value Date/Time   CALCIUM 9.7 06/18/2015 1442   ALKPHOS 55 06/18/2015 1442   AST 27 06/18/2015 1442   ALT 32* 06/18/2015 1442   BILITOT 0.5 06/18/2015 1442     Impression and Plan: Stacie Cardenas is a 35 yo African female with a history of iron deficiency secondary to menorrhagia and crohn's disease. She is symptomatic with fatigued and chewing ice.  Her Hgb is stable at 13.3 with an MCV of 88. We will see what her iron studies show bring her in for an infusion next week if needed.  We will plan to see her back in 4 months for follow-up and labs.  She knows to contact us with any questions or concerns. We can certainly see her sooner if need be.   Eliezer Bottom, NP 5/11/201710:54 AM

## 2015-10-22 LAB — RETICULOCYTES: Reticulocyte Count: 1.8 % (ref 0.6–2.6)

## 2016-01-11 ENCOUNTER — Encounter: Payer: Self-pay | Admitting: Physician Assistant

## 2016-02-24 ENCOUNTER — Other Ambulatory Visit: Payer: 59

## 2016-02-24 ENCOUNTER — Ambulatory Visit: Payer: 59 | Admitting: Family

## 2016-03-01 ENCOUNTER — Other Ambulatory Visit (HOSPITAL_BASED_OUTPATIENT_CLINIC_OR_DEPARTMENT_OTHER): Payer: 59

## 2016-03-01 ENCOUNTER — Ambulatory Visit (HOSPITAL_BASED_OUTPATIENT_CLINIC_OR_DEPARTMENT_OTHER): Payer: 59

## 2016-03-01 ENCOUNTER — Encounter: Payer: Self-pay | Admitting: Family

## 2016-03-01 ENCOUNTER — Ambulatory Visit (HOSPITAL_BASED_OUTPATIENT_CLINIC_OR_DEPARTMENT_OTHER): Payer: 59 | Admitting: Family

## 2016-03-01 ENCOUNTER — Other Ambulatory Visit: Payer: Self-pay | Admitting: Family

## 2016-03-01 VITALS — BP 151/90 | HR 81 | Temp 98.0°F | Resp 20 | Ht 64.0 in | Wt 227.0 lb

## 2016-03-01 VITALS — BP 141/83 | HR 76

## 2016-03-01 DIAGNOSIS — D500 Iron deficiency anemia secondary to blood loss (chronic): Secondary | ICD-10-CM

## 2016-03-01 DIAGNOSIS — D508 Other iron deficiency anemias: Secondary | ICD-10-CM

## 2016-03-01 DIAGNOSIS — N920 Excessive and frequent menstruation with regular cycle: Secondary | ICD-10-CM

## 2016-03-01 DIAGNOSIS — K5090 Crohn's disease, unspecified, without complications: Secondary | ICD-10-CM | POA: Diagnosis not present

## 2016-03-01 DIAGNOSIS — D509 Iron deficiency anemia, unspecified: Secondary | ICD-10-CM

## 2016-03-01 DIAGNOSIS — N921 Excessive and frequent menstruation with irregular cycle: Secondary | ICD-10-CM

## 2016-03-01 DIAGNOSIS — K920 Hematemesis: Secondary | ICD-10-CM

## 2016-03-01 LAB — CHCC SATELLITE - SMEAR

## 2016-03-01 LAB — CBC WITH DIFFERENTIAL (CANCER CENTER ONLY)
BASO#: 0 10*3/uL (ref 0.0–0.2)
BASO%: 0.4 % (ref 0.0–2.0)
EOS ABS: 0.1 10*3/uL (ref 0.0–0.5)
EOS%: 0.9 % (ref 0.0–7.0)
HEMATOCRIT: 40.9 % (ref 34.8–46.6)
HEMOGLOBIN: 13.7 g/dL (ref 11.6–15.9)
LYMPH#: 3.2 10*3/uL (ref 0.9–3.3)
LYMPH%: 40.5 % (ref 14.0–48.0)
MCH: 28.8 pg (ref 26.0–34.0)
MCHC: 33.5 g/dL (ref 32.0–36.0)
MCV: 86 fL (ref 81–101)
MONO#: 0.5 10*3/uL (ref 0.1–0.9)
MONO%: 6.4 % (ref 0.0–13.0)
NEUT%: 51.8 % (ref 39.6–80.0)
NEUTROS ABS: 4 10*3/uL (ref 1.5–6.5)
Platelets: 230 10*3/uL (ref 145–400)
RBC: 4.75 10*6/uL (ref 3.70–5.32)
RDW: 13.1 % (ref 11.1–15.7)
WBC: 7.8 10*3/uL (ref 3.9–10.0)

## 2016-03-01 LAB — IRON AND TIBC
%SAT: 23 % (ref 21–57)
Iron: 56 ug/dL (ref 41–142)
TIBC: 240 ug/dL (ref 236–444)
UIBC: 184 ug/dL (ref 120–384)

## 2016-03-01 LAB — FERRITIN: Ferritin: 175 ng/ml (ref 9–269)

## 2016-03-01 MED ORDER — SODIUM CHLORIDE 0.9 % IV SOLN
Freq: Once | INTRAVENOUS | Status: AC
  Administered 2016-03-01: 10:00:00 via INTRAVENOUS

## 2016-03-01 MED ORDER — SODIUM CHLORIDE 0.9 % IV SOLN: 510.0000 mg | Freq: Once | INTRAVENOUS | Status: DC

## 2016-03-01 MED ORDER — SODIUM CHLORIDE 0.9 % IV SOLN
510.0000 mg | Freq: Once | INTRAVENOUS | Status: AC
  Administered 2016-03-01: 510 mg via INTRAVENOUS
  Filled 2016-03-01: qty 17

## 2016-03-01 NOTE — Patient Instructions (Signed)

## 2016-03-01 NOTE — Progress Notes (Signed)
Hematology and Oncology Follow Up Visit  Stacie Cardenas [id number] May 29, 1981 35 y.o. 03/01/2016   Principle Diagnosis:  Iron deficiency anemia Crohn's disease Menometrorrhagia  Current Therapy:   IV iron as indicated - last received September 2016     Interim History: Ms. Stacie Cardenas is here today for a follow-up. She is symptomatic with increasing fatigue, chewing ice and numbness/tingling in her fingertips.  She has not had a recent flare with her Chron's. She is now on a doubled dose of Remicade every 8 weeks and still takes Imuran 50 mg daily.   No fever, chills, n/v, cough, rash, headache, dizziness, SOB, chest pain, palpitations, abdominal pain or changes in bowel or bladder habits.  Her cycles are still heavy. No other bleeding or bruising. No lymphadenopathy found on exam.  No swelling or tenderness in her extremities.  She has maintained a good appetite and is staying well hydrated. Her weight is stable.   Medications:    Medication List       Accurate as of 03/01/16  9:19 AM. Always use your most recent med list.          Adapalene-Benzoyl Peroxide 0.1-2.5 % gel Apply 1 application topically every morning.   ALPRAZolam 0.25 MG tablet Commonly known as:  XANAX Once daily as needed for acute anxiety.   azaTHIOprine 50 MG tablet Commonly known as:  IMURAN Take 1 tablet (50 mg total) by mouth daily.   dicyclomine 20 MG tablet Commonly known as:  BENTYL Take 20 mg by mouth 3 (three) times daily.   fluticasone 50 MCG/ACT nasal spray Commonly known as:  FLONASE Place 2 sprays into the nose as needed.   REMICADE 100 MG injection Generic drug:  inFLIXimab Inject into the vein every 8 (eight) weeks.   simvastatin 40 MG tablet Commonly known as:  ZOCOR Take 1 tablet (40 mg total) by mouth every evening.   SUMAtriptan 100 MG tablet Commonly known as:  IMITREX Take 100 mg by mouth as needed for migraine.   telmisartan-hydrochlorothiazide 80-12.5 MG  tablet Commonly known as:  MICARDIS HCT Take 1 tablet by mouth daily.   traZODone 50 MG tablet Commonly known as:  DESYREL Take 0.5-1 tablets (25-50 mg total) by mouth at bedtime as needed for sleep.   triamcinolone cream 0.1 % Commonly known as:  KENALOG Apply 1 application topically 2 (two) times daily.   Vitamin D (Ergocalciferol) 50000 units Caps capsule Commonly known as:  DRISDOL TAKE 1 CAPSULE BY MOUTH ONCE A WEEK FOR X 8 WEEK       Allergies:  Allergies  Allergen Reactions  . Ampicillin Diarrhea  . Xigduo Xr [Dapagliflozin-Metformin Hcl Er]     Yeast infections.     Past Medical History, Surgical history, Social history, and Family History were reviewed and updated.  Review of Systems: All other 10 point review of systems is negative.   Physical Exam:  vitals were not taken for this visit.  Wt Readings from Last 3 Encounters:  10/21/15 226 lb (102.5 kg)  09/07/15 224 lb (101.6 kg)  06/22/15 220 lb (99.8 kg)    Ocular: Sclerae unicteric, pupils equal, round and reactive to light Ear-nose-throat: Oropharynx clear, dentition fair Lymphatic: No cervical supraclavicular or axillary adenopathy Lungs no rales or rhonchi, good excursion bilaterally Heart regular rate and rhythm, no murmur appreciated Abd soft, nontender, positive bowel sounds, no liver or spleen tip palpated on exam, no fluid wave MSK no focal spinal tenderness, no joint edema Neuro: non-focal, well-oriented,  appropriate affect Breasts: Deferred  Lab Results  Component Value Date   WBC 7.8 03/01/2016   HGB 13.7 03/01/2016   HCT 40.9 03/01/2016   MCV 86 03/01/2016   PLT 230 03/01/2016   Lab Results  Component Value Date   FERRITIN 236 10/21/2015   IRON 52 10/21/2015   TIBC 229 (L) 10/21/2015   UIBC 177 10/21/2015   IRONPCTSAT 23 10/21/2015   Lab Results  Component Value Date   RETICCTPCT 2.0 03/08/2015   RBC 4.75 03/01/2016   RETICCTABS 95.8 03/08/2015   No results found for:  KPAFRELGTCHN, LAMBDASER, KAPLAMBRATIO No results found for: IGGSERUM, IGA, IGMSERUM No results found for: Odetta Pink, SPEI   Chemistry      Component Value Date/Time   NA 140 06/18/2015 1442   K 4.0 06/18/2015 1442   CL 103 06/18/2015 1442   CO2 28 06/18/2015 1442   BUN 7 06/18/2015 1442   CREATININE 0.67 06/18/2015 1442      Component Value Date/Time   CALCIUM 9.7 06/18/2015 1442   ALKPHOS 55 06/18/2015 1442   AST 27 06/18/2015 1442   ALT 32 (H) 06/18/2015 1442   BILITOT 0.5 06/18/2015 1442     Impression and Plan: [name] is a 35 yo African female with a history of iron deficiency secondary to menometrorrhagia and crohn's disease.  She is symptomatic at this time and requesting an iron infusion. We will proceed with one dose of iron today. We will then see what her iron studies show and if she will require a second dose. We will plan to see her back in 3 months, before Christmas, for follow-up and labs.  She knows to contact us with any questions or concerns. We can certainly see her sooner if need be.   Eliezer Bottom, NP 9/20/20179:19 AM

## 2016-03-02 LAB — RETICULOCYTES: Reticulocyte Count: 1.5 % (ref 0.6–2.6)

## 2016-03-03 ENCOUNTER — Telehealth: Payer: Self-pay | Admitting: *Deleted

## 2016-03-03 LAB — HM DIABETES EYE EXAM

## 2016-03-03 NOTE — Telephone Encounter (Signed)
-----   Message from Eliezer Bottom, NP sent at 03/02/2016  8:59 AM EDT ----- Regarding: Iron  Iron studies looking a bit better. Had iron yesterday and will not require a second dose at this time. Thank you!  Stacie Cardenas  ----- Message ----- From: Interface, Lab In Three Zero One Sent: 03/01/2016   9:16 AM To: Eliezer Bottom, NP

## 2016-03-17 ENCOUNTER — Encounter: Payer: Self-pay | Admitting: Physician Assistant

## 2016-05-03 ENCOUNTER — Encounter: Payer: 59 | Admitting: Physician Assistant

## 2016-05-16 ENCOUNTER — Ambulatory Visit (INDEPENDENT_AMBULATORY_CARE_PROVIDER_SITE_OTHER): Payer: 59 | Admitting: Physician Assistant

## 2016-05-16 ENCOUNTER — Encounter: Payer: Self-pay | Admitting: Physician Assistant

## 2016-05-16 VITALS — BP 140/92 | HR 80 | Ht 64.0 in | Wt 227.0 lb

## 2016-05-16 DIAGNOSIS — Z0000 Encounter for general adult medical examination without abnormal findings: Secondary | ICD-10-CM

## 2016-05-16 DIAGNOSIS — I10 Essential (primary) hypertension: Secondary | ICD-10-CM

## 2016-05-16 DIAGNOSIS — Z13220 Encounter for screening for lipoid disorders: Secondary | ICD-10-CM | POA: Diagnosis not present

## 2016-05-16 DIAGNOSIS — Z131 Encounter for screening for diabetes mellitus: Secondary | ICD-10-CM

## 2016-05-16 DIAGNOSIS — R7301 Impaired fasting glucose: Secondary | ICD-10-CM

## 2016-05-16 DIAGNOSIS — D509 Iron deficiency anemia, unspecified: Secondary | ICD-10-CM

## 2016-05-16 LAB — COMPLETE METABOLIC PANEL WITH GFR
ALT: 28 U/L (ref 6–29)
AST: 20 U/L (ref 10–30)
Albumin: 4 g/dL (ref 3.6–5.1)
Alkaline Phosphatase: 60 U/L (ref 33–115)
BUN: 6 mg/dL — AB (ref 7–25)
CHLORIDE: 102 mmol/L (ref 98–110)
CO2: 28 mmol/L (ref 20–31)
CREATININE: 0.72 mg/dL (ref 0.50–1.10)
Calcium: 9.1 mg/dL (ref 8.6–10.2)
GFR, Est African American: >89 mL/min (ref >=60)
GFR, Est Non African American: >89 mL/min (ref >=60)
GLUCOSE: 123 mg/dL — AB (ref 65–99)
POTASSIUM: 3.9 mmol/L (ref 3.5–5.3)
SODIUM: 137 mmol/L (ref 135–146)
Total Bilirubin: 0.4 mg/dL (ref 0.2–1.2)
Total Protein: 7 g/dL (ref 6.1–8.1)

## 2016-05-16 LAB — LIPID PANEL
CHOL/HDL RATIO: 4.8 ratio (ref <5.0)
CHOLESTEROL: 234 mg/dL — AB (ref <200)
HDL: 49 mg/dL — ABNORMAL LOW (ref >50)
LDL CALC: 162 mg/dL — AB (ref <100)
Triglycerides: 115 mg/dL (ref <150)
VLDL: 23 mg/dL (ref <30)

## 2016-05-16 LAB — CBC WITH DIFFERENTIAL/PLATELET
BASOS PCT: 0 %
Basophils Absolute: 0 cells/uL (ref 0–200)
EOS ABS: 84 {cells}/uL (ref 15–500)
EOS PCT: 1 %
HCT: 40.8 % (ref 35.0–45.0)
Hemoglobin: 13.3 g/dL (ref 11.7–15.5)
LYMPHS PCT: 40 %
Lymphs Abs: 3360 cells/uL (ref 850–3900)
MCH: 28.1 pg (ref 27.0–33.0)
MCHC: 32.6 g/dL (ref 32.0–36.0)
MCV: 86.3 fL (ref 80.0–100.0)
MONOS PCT: 5 %
MPV: 11.9 fL (ref 7.5–12.5)
Monocytes Absolute: 420 cells/uL (ref 200–950)
NEUTROS ABS: 4536 {cells}/uL (ref 1500–7800)
Neutrophils Relative %: 54 %
PLATELETS: 228 10*3/uL (ref 140–400)
RBC: 4.73 MIL/uL (ref 3.80–5.10)
RDW: 14.3 % (ref 11.0–15.0)
WBC: 8.4 10*3/uL (ref 3.8–10.8)

## 2016-05-16 LAB — HEMOGLOBIN A1C
HEMOGLOBIN A1C: 6.5 % — AB (ref <5.7)
Mean Plasma Glucose: 140 mg/dL

## 2016-05-16 LAB — IBC PANEL
%SAT: 27 % (ref 11–50)
TIBC: 234 ug/dL — AB (ref 250–450)
UIBC: 171 ug/dL (ref 125–400)

## 2016-05-16 LAB — FERRITIN: FERRITIN: 270 ng/mL — AB (ref 10–154)

## 2016-05-16 LAB — IRON: IRON: 63 ug/dL (ref 40–190)

## 2016-05-16 NOTE — Patient Instructions (Signed)

## 2016-05-16 NOTE — Progress Notes (Addendum)
Subjective:    Patient ID: Stacie Cardenas, female    DOB: 07-09-1980, 35 y.o.   MRN: [id number]  HPI Patient is a 35 yo female coming for an annual physical. Patient did not take Micardis HCT this morning because she was afraid that it would ruin her fasting blood work that she knew she was going to get today. Her blood pressure in the office today is 159/109. Patient has no complaints.   Past Medical History:  Diagnosis Date  . Hypercholesterolemia   . Hypertension   . IBS (irritable bowel syndrome)   . Other specified iron deficiency anemias 03/17/2013    Current Outpatient Prescriptions on File Prior to Visit  Medication Sig Dispense Refill  . Adapalene-Benzoyl Peroxide 0.1-2.5 % gel Apply 1 application topically every morning.    Marland Kitchen ALPRAZolam (XANAX) 0.25 MG tablet Once daily as needed for acute anxiety. 30 tablet 1  . azaTHIOprine (IMURAN) 50 MG tablet Take 1 tablet (50 mg total) by mouth daily.    Marland Kitchen dicyclomine (BENTYL) 20 MG tablet Take 20 mg by mouth 3 (three) times daily.  1  . inFLIXimab (REMICADE) 100 MG injection Inject into the vein every 8 (eight) weeks.    . simvastatin (ZOCOR) 40 MG tablet Take 1 tablet (40 mg total) by mouth every evening. 30 tablet 11  . SUMAtriptan (IMITREX) 100 MG tablet Take 100 mg by mouth as needed for migraine.    Marland Kitchen telmisartan-hydrochlorothiazide (MICARDIS HCT) 80-12.5 MG per tablet Take 1 tablet by mouth daily. 30 tablet 6  . traZODone (DESYREL) 50 MG tablet Take 0.5-1 tablets (25-50 mg total) by mouth at bedtime as needed for sleep. 30 tablet 3  . triamcinolone cream (KENALOG) 0.1 % Apply 1 application topically 2 (two) times daily. 45 g 0  . Vitamin D, Ergocalciferol, (DRISDOL) 50000 units CAPS capsule TAKE 1 CAPSULE BY MOUTH ONCE A WEEK FOR X 8 WEEK  0  . fluticasone (FLONASE) 50 MCG/ACT nasal spray Place 2 sprays into the nose as needed.     No current facility-administered medications on file prior to visit.    Allergies  Allergen  Reactions  . Ampicillin Diarrhea  . Xigduo Xr [Dapagliflozin-Metformin Hcl Er]     Yeast infections.    Social History   Social History  . Marital status: Married    Spouse name: N/A  . Number of children: N/A  . Years of education: N/A   Occupational History  . Not on file.   Social History Main Topics  . Smoking status: Never Smoker  . Smokeless tobacco: Never Used     Comment: never used tobacco  . Alcohol use No  . Drug use: No  . Sexual activity: Yes    Birth control/ protection: Surgical   Other Topics Concern  . Not on file   Social History Narrative  . No narrative on file     Review of Systems  All other systems reviewed and are negative.      Objective: Blood pressure (!) 159/109, pulse 80, height 5' 4"  (1.626 m), weight 227 lb (103 kg).   Physical Exam  Constitutional: She is oriented to person, place, and time. She appears well-developed and well-nourished. No distress.  HENT:  Head: Normocephalic and atraumatic.  Right Ear: External ear normal.  Left Ear: External ear normal.  Nose: Nose normal.  Mouth/Throat: Oropharynx is clear and moist.  Tympanic membranes visualized and clear bilaterally.   Eyes: EOM are normal.  Neck: Neck supple.  No thyromegaly present.  Cardiovascular: Normal rate and intact distal pulses.  Exam reveals no gallop and no friction rub.   No murmur heard. Pulmonary/Chest: Effort normal and breath sounds normal. No respiratory distress. She has no wheezes. She has no rales.  Abdominal: Soft. Bowel sounds are normal. She exhibits no distension and no mass. There is no tenderness. There is no rebound and no guarding.  Musculoskeletal: She exhibits no edema, tenderness or deformity.  Neurological: She is alert and oriented to person, place, and time. She has normal reflexes.  Brachial and patellar reflex +2 bilaterally. Strength of upper and lower extremities 5/5 bilaterally. Cranial nerves II-XII intact.   Skin: Skin is warm.  No rash noted. No erythema.  Psychiatric: She has a normal mood and affect. Her behavior is normal.      Assessment & Plan:  Stacie Cardenas was seen today for annual exam.  Diagnoses and all orders for this visit:  1. Annual physical exam -COMPLETE METABOLIC PANEL WITH GFR to monitor electrolytes, liver and kidney function.  - Lipid Profile to check cholesterol.  - CBC with Differential due to patient having a history of iron deficiency anemia.  - HgB A1c due to patient having elevated A1c in the past.  -  IBC panel due to history of iron deficiency anemia.  -  Ferritin due to history of iron deficiency anemia. -discussed Vitamin D 800 units and calcium 155m daily -encouraged red yeast rice.   2. Screening for lipid disorders -   Preform Lipid Profile to check cholesterol.   3. Screening for diabetes mellitus -  Preform  COMPLETE METABOLIC PANEL WITH GFR to monitor electrolytes, liver and kidney function.   4. HYPERTENSION, BENIGN ESSENTIAL        -  Patient's blood pressure 159/109 today due to her not wanting to take Micardis HCT because she knew she was getting fasting labs today. Educated the to take her Micardis HCT daily to control her blood pressure. Patient understands, and we gave the patient water to take Micardis HCT in the office today.  2nd recheck BP started to come down.   5. Iron deficiency anemia, unspecified iron deficiency anemia type        - IBC panel due to history of iron deficiency anemia.   - Ferritin due to history of iron deficiency anemia.  6. Impaired fasting glucose        -   Check  HgB A1c due to patient having elevated A1c in the past.

## 2016-05-17 ENCOUNTER — Encounter: Payer: Self-pay | Admitting: Physician Assistant

## 2016-05-17 NOTE — Progress Notes (Signed)
Call pt: A!C is back in diabetes range. Up from one year ago. We should start metformin and recheck in 3 months. Is this ok?  LdL increased more. Are you taking zocor? Goal is LDL under 100.

## 2016-05-19 ENCOUNTER — Other Ambulatory Visit: Payer: Self-pay | Admitting: *Deleted

## 2016-05-19 MED ORDER — SIMVASTATIN 40 MG PO TABS: 40.0000 mg | ORAL_TABLET | Freq: Every evening | ORAL | 11 refills | Status: DC

## 2016-05-19 MED ORDER — EMPAGLIFLOZIN 10 MG PO TABS: 10.0000 mg | ORAL_TABLET | Freq: Every day | ORAL | 2 refills | Status: DC

## 2016-05-19 NOTE — Progress Notes (Signed)
Call pt: you need to restart zocor. At one point you were on extended release metformin. Did you not tolerate that either? If not can send in jardiance 51m daily #30 2 refills. Recheck a1c in 3 months.

## 2016-05-19 NOTE — Progress Notes (Unsigned)
Refills sent to pharmacy. 

## 2016-05-24 ENCOUNTER — Telehealth: Payer: Self-pay | Admitting: *Deleted

## 2016-05-24 NOTE — Telephone Encounter (Signed)
Jardiance submitted and approved by insurance Outcome  Approvedtoday  PA Case [id number] is Approved. For further questions, call [phone].   Patient notified and pharmacy notified

## 2016-05-25 ENCOUNTER — Other Ambulatory Visit: Payer: Self-pay | Admitting: Family Medicine

## 2016-05-31 ENCOUNTER — Ambulatory Visit (HOSPITAL_BASED_OUTPATIENT_CLINIC_OR_DEPARTMENT_OTHER): Payer: 59 | Admitting: Family

## 2016-05-31 ENCOUNTER — Other Ambulatory Visit (HOSPITAL_BASED_OUTPATIENT_CLINIC_OR_DEPARTMENT_OTHER): Payer: 59

## 2016-05-31 VITALS — BP 132/84 | HR 83 | Temp 98.7°F | Resp 20 | Wt 224.0 lb

## 2016-05-31 DIAGNOSIS — K5090 Crohn's disease, unspecified, without complications: Secondary | ICD-10-CM

## 2016-05-31 DIAGNOSIS — N920 Excessive and frequent menstruation with regular cycle: Secondary | ICD-10-CM

## 2016-05-31 DIAGNOSIS — D500 Iron deficiency anemia secondary to blood loss (chronic): Secondary | ICD-10-CM

## 2016-05-31 DIAGNOSIS — N921 Excessive and frequent menstruation with irregular cycle: Secondary | ICD-10-CM

## 2016-05-31 DIAGNOSIS — D509 Iron deficiency anemia, unspecified: Secondary | ICD-10-CM

## 2016-05-31 LAB — CBC WITH DIFFERENTIAL (CANCER CENTER ONLY)
BASO#: 0 10*3/uL (ref 0.0–0.2)
BASO%: 0.3 % (ref 0.0–2.0)
EOS%: 0.9 % (ref 0.0–7.0)
Eosinophils Absolute: 0.1 10*3/uL (ref 0.0–0.5)
HEMATOCRIT: 41.4 % (ref 34.8–46.6)
HGB: 13.7 g/dL (ref 11.6–15.9)
LYMPH#: 3.5 10*3/uL — AB (ref 0.9–3.3)
LYMPH%: 43.5 % (ref 14.0–48.0)
MCH: 28.7 pg (ref 26.0–34.0)
MCHC: 33.1 g/dL (ref 32.0–36.0)
MCV: 87 fL (ref 81–101)
MONO#: 0.5 10*3/uL (ref 0.1–0.9)
MONO%: 6.3 % (ref 0.0–13.0)
NEUT#: 3.9 10*3/uL (ref 1.5–6.5)
NEUT%: 49 % (ref 39.6–80.0)
PLATELETS: 222 10*3/uL (ref 145–400)
RBC: 4.78 10*6/uL (ref 3.70–5.32)
RDW: 13.5 % (ref 11.1–15.7)
WBC: 8 10*3/uL (ref 3.9–10.0)

## 2016-05-31 LAB — IRON AND TIBC
%SAT: 31 % (ref 21–57)
IRON: 76 ug/dL (ref 41–142)
TIBC: 248 ug/dL (ref 236–444)
UIBC: 171 ug/dL (ref 120–384)

## 2016-05-31 NOTE — Progress Notes (Signed)
Hematology and Oncology Follow Up Visit  Stacie Cardenas [id number] 1981-04-23 35 y.o. 05/31/2016   Principle Diagnosis:  Iron deficiency anemia Crohn's disease Menometrorrhagia  Current Therapy:   IV iron as indicated - last received September 2017     Interim History: Stacie Cardenas is here today for a follow-up. She is doing well but feeling fatigued and chewing ice quite a bit. Her total iron and ferritin earlier this month were ok. Iron saturation today is pending. CBC looks good.  Her cycles are still quite heavy. She has had no other episodes of bleeding, bruising or petechiae.  She has not had a recent flare with her Chron's. She is still on her doubled dose of Remicade every 8 weeks and taking Imuran 50 mg daily.   No fever, chills, n/v, cough, rash, headache, dizziness, SOB, chest pain, palpitations, abdominal pain or changes in bowel or bladder habits.  No lymphadenopathy found on exam.  No swelling, tenderness, numbness or tingling in her extremities.  She has maintained a good appetite and is staying well hydrated. Her weight is stable.   Medications:  Allergies as of 05/31/2016      Reactions   Ampicillin Diarrhea   Xigduo Xr [dapagliflozin-metformin Hcl Er]    Yeast infections.       Medication List       Accurate as of 05/31/16  9:29 AM. Always use your most recent med list.          Adapalene-Benzoyl Peroxide 0.1-2.5 % gel Apply 1 application topically every morning.   ALPRAZolam 0.25 MG tablet Commonly known as:  XANAX Once daily as needed for acute anxiety.   azaTHIOprine 50 MG tablet Commonly known as:  IMURAN Take 1 tablet (50 mg total) by mouth daily.   BAYER CONTOUR NEXT MONITOR w/Device Kit CHECK BLOOD SUGAR ONCE A DAY   BAYER CONTOUR NEXT TEST test strip Generic drug:  glucose blood CHECK BLOOD SAGUAR ONCE A DAY   dicyclomine 20 MG tablet Commonly known as:  BENTYL Take 20 mg by mouth 3 (three) times daily.   empagliflozin 10 MG Tabs  tablet Commonly known as:  JARDIANCE Take 10 mg by mouth daily.   REMICADE 100 MG injection Generic drug:  inFLIXimab Inject into the vein every 8 (eight) weeks.   simvastatin 40 MG tablet Commonly known as:  ZOCOR Take 1 tablet (40 mg total) by mouth every evening.   SUMAtriptan 100 MG tablet Commonly known as:  IMITREX Take 100 mg by mouth as needed for migraine.   telmisartan-hydrochlorothiazide 80-12.5 MG tablet Commonly known as:  MICARDIS HCT Take 1 tablet by mouth daily.   traZODone 50 MG tablet Commonly known as:  DESYREL Take 0.5-1 tablets (25-50 mg total) by mouth at bedtime as needed for sleep.   triamcinolone cream 0.1 % Commonly known as:  KENALOG Apply 1 application topically 2 (two) times daily.       Allergies:  Allergies  Allergen Reactions  . Ampicillin Diarrhea  . Xigduo Xr [Dapagliflozin-Metformin Hcl Er]     Yeast infections.     Past Medical History, Surgical history, Social history, and Family History were reviewed and updated.  Review of Systems: All other 10 point review of systems is negative.   Physical Exam:  weight is 224 lb (101.6 kg). Her oral temperature is 98.7 F (37.1 C). Her blood pressure is 132/84 and her pulse is 83. Her respiration is 20.   Wt Readings from Last 3 Encounters:  05/31/16 224  lb (101.6 kg)  05/16/16 227 lb (103 kg)  03/01/16 227 lb (103 kg)    Ocular: Sclerae unicteric, pupils equal, round and reactive to light Ear-nose-throat: Oropharynx clear, dentition fair Lymphatic: No cervical supraclavicular or axillary adenopathy Lungs no rales or rhonchi, good excursion bilaterally Heart regular rate and rhythm, no murmur appreciated Abd soft, nontender, positive bowel sounds, no liver or spleen tip palpated on exam, no fluid wave MSK no focal spinal tenderness, no joint edema Neuro: non-focal, well-oriented, appropriate affect Breasts: Deferred  Lab Results  Component Value Date   WBC 8.0 05/31/2016    HGB 13.7 05/31/2016   HCT 41.4 05/31/2016   MCV 87 05/31/2016   PLT 222 05/31/2016   Lab Results  Component Value Date   FERRITIN 270 (H) 05/16/2016   IRON 63 05/16/2016   TIBC 234 (L) 05/16/2016   UIBC 171 05/16/2016   IRONPCTSAT 27 05/16/2016   Lab Results  Component Value Date   RETICCTPCT 2.0 03/08/2015   RBC 4.78 05/31/2016   RETICCTABS 95.8 03/08/2015   No results found for: KPAFRELGTCHN, LAMBDASER, KAPLAMBRATIO No results found for: IGGSERUM, IGA, IGMSERUM No results found for: Ronnald Ramp, A1GS, A2GS, Violet Baldy, MSPIKE, SPEI   Chemistry      Component Value Date/Time   NA 137 05/16/2016 1005   K 3.9 05/16/2016 1005   CL 102 05/16/2016 1005   CO2 28 05/16/2016 1005   BUN 6 (L) 05/16/2016 1005   CREATININE 0.72 05/16/2016 1005      Component Value Date/Time   CALCIUM 9.1 05/16/2016 1005   ALKPHOS 60 05/16/2016 1005   AST 20 05/16/2016 1005   ALT 28 05/16/2016 1005   BILITOT 0.4 05/16/2016 1005     Impression and Plan: Stacie Cardenas is a 35 yo African female with a history of iron deficiency secondary to menometrorrhagia and crohn's disease. She is doing fairly well but has noticed increased fatigue and chewing ice.  Iron studies earlier this month were stable and CBC today looks good.  Her cycles continue to be heavy. We will see what her iron saturation today is and bring her back in later this week for an infusion if needed.  We will plan to see her back in 4 months for follow-up and labs.  She knows to contact us with any questions or concerns. We can certainly see her sooner if need be.   Eliezer Bottom, NP 12/20/20179:29 AM

## 2016-06-01 ENCOUNTER — Telehealth: Payer: Self-pay | Admitting: *Deleted

## 2016-06-01 LAB — RETICULOCYTES: Reticulocyte Count: 1.6 % (ref 0.6–2.6)

## 2016-06-01 NOTE — Telephone Encounter (Addendum)
Patient aware of results  ----- Message from Eliezer Bottom, NP sent at 05/31/2016  4:23 PM EST ----- Regarding: Iron  Iron looks good. No infusion needed. Thank you!  Sarah  ----- Message ----- From: Interface, Lab In Three Zero One Sent: 05/31/2016   9:20 AM To: Eliezer Bottom, NP

## 2016-06-08 ENCOUNTER — Other Ambulatory Visit: Payer: Self-pay | Admitting: *Deleted

## 2016-06-08 MED ORDER — AMBULATORY NON FORMULARY MEDICATION: 1 refills | Status: DC

## 2016-06-08 MED ORDER — TELMISARTAN-HCTZ 80-12.5 MG PO TABS: 1.0000 | ORAL_TABLET | Freq: Every day | ORAL | 6 refills | Status: DC

## 2016-09-29 ENCOUNTER — Other Ambulatory Visit: Payer: Self-pay | Admitting: *Deleted

## 2016-09-29 ENCOUNTER — Other Ambulatory Visit (HOSPITAL_BASED_OUTPATIENT_CLINIC_OR_DEPARTMENT_OTHER): Payer: 59

## 2016-09-29 ENCOUNTER — Ambulatory Visit (HOSPITAL_BASED_OUTPATIENT_CLINIC_OR_DEPARTMENT_OTHER): Payer: 59 | Admitting: Family

## 2016-09-29 VITALS — BP 135/90 | HR 71 | Temp 98.0°F | Resp 18 | Wt 219.8 lb

## 2016-09-29 DIAGNOSIS — D500 Iron deficiency anemia secondary to blood loss (chronic): Secondary | ICD-10-CM

## 2016-09-29 DIAGNOSIS — N921 Excessive and frequent menstruation with irregular cycle: Secondary | ICD-10-CM | POA: Diagnosis not present

## 2016-09-29 DIAGNOSIS — D508 Other iron deficiency anemias: Secondary | ICD-10-CM

## 2016-09-29 DIAGNOSIS — D510 Vitamin B12 deficiency anemia due to intrinsic factor deficiency: Secondary | ICD-10-CM

## 2016-09-29 DIAGNOSIS — E559 Vitamin D deficiency, unspecified: Secondary | ICD-10-CM

## 2016-09-29 DIAGNOSIS — K5090 Crohn's disease, unspecified, without complications: Secondary | ICD-10-CM

## 2016-09-29 DIAGNOSIS — N920 Excessive and frequent menstruation with regular cycle: Secondary | ICD-10-CM

## 2016-09-29 LAB — CBC WITH DIFFERENTIAL (CANCER CENTER ONLY)
BASO#: 0 10*3/uL (ref 0.0–0.2)
BASO%: 0.2 % (ref 0.0–2.0)
EOS%: 0.9 % (ref 0.0–7.0)
Eosinophils Absolute: 0.1 10*3/uL (ref 0.0–0.5)
HEMATOCRIT: 43.5 % (ref 34.8–46.6)
HEMOGLOBIN: 14.1 g/dL (ref 11.6–15.9)
LYMPH#: 4.4 10*3/uL — AB (ref 0.9–3.3)
LYMPH%: 43.5 % (ref 14.0–48.0)
MCH: 28.2 pg (ref 26.0–34.0)
MCHC: 32.4 g/dL (ref 32.0–36.0)
MCV: 87 fL (ref 81–101)
MONO#: 0.6 10*3/uL (ref 0.1–0.9)
MONO%: 6.2 % (ref 0.0–13.0)
NEUT%: 49.2 % (ref 39.6–80.0)
NEUTROS ABS: 4.9 10*3/uL (ref 1.5–6.5)
Platelets: 242 10*3/uL (ref 145–400)
RBC: 5 10*6/uL (ref 3.70–5.32)
RDW: 13.4 % (ref 11.1–15.7)
WBC: 10 10*3/uL (ref 3.9–10.0)

## 2016-09-29 LAB — IRON AND TIBC
%SAT: 17 % — AB (ref 21–57)
IRON: 45 ug/dL (ref 41–142)
TIBC: 266 ug/dL (ref 236–444)
UIBC: 221 ug/dL (ref 120–384)

## 2016-09-29 LAB — FERRITIN: FERRITIN: 130 ng/mL (ref 9–269)

## 2016-09-29 NOTE — Progress Notes (Signed)
Hematology and Oncology Follow Up Visit  Stacie Cardenas [id number] 01/19/1981 36 y.o. 09/29/2016   Principle Diagnosis:  Iron deficiency anemia Crohn's disease Menometrorrhagia  Current Therapy:   IV iron as indicated - last received September 2017    Interim History:  Stacie Cardenas is here today for follow-up. She is symptomatic with fatigue. She states that she feels tired all the time.  Her Chron's has been well controlled with no recent flare. She is still receiving her Remicade every 8 weeks and daily Imuran.  We will add a vit D and B 12 to her lab work. Iron studies are pending.  She has not required an iron infusion since September 2017.  Her cycles continue to be heavy. No other bleeding and no bruising or petechiae. No lymphadenopathy found one exam.  No fever, chills, n/v, cough, rash, dizziness, SOB, chest pain, palpitations, abdominal pain or changes in bowel or bladder habits.  No swelling or tenderness in her extremities. She has occasional numbness and tingling in her arm which is positional and comes and goes.  She has maintained a good appetite and is staying well hydrated. Her weight is stable.   ECOG Performance Status: 1 - Symptomatic but completely ambulatory  Medications:  Allergies as of 09/29/2016      Reactions   Ampicillin Diarrhea   Xigduo Xr [dapagliflozin-metformin Hcl Er]    Yeast infections.       Medication List       Accurate as of 09/29/16 10:08 AM. Always use your most recent med list.          Adapalene-Benzoyl Peroxide 0.1-2.5 % gel Apply 1 application topically every morning.   ALPRAZolam 0.25 MG tablet Commonly known as:  XANAX Once daily as needed for acute anxiety.   AMBULATORY NON FORMULARY MEDICATION BAYER CONTOUR NEXT TESTING STRIPS AND LANCETS   azaTHIOprine 50 MG tablet Commonly known as:  IMURAN Take 1 tablet (50 mg total) by mouth daily.   BAYER CONTOUR NEXT MONITOR w/Device Kit CHECK BLOOD SUGAR ONCE A DAY   BAYER  CONTOUR NEXT TEST test strip Generic drug:  glucose blood CHECK BLOOD SAGUAR ONCE A DAY   dicyclomine 20 MG tablet Commonly known as:  BENTYL Take 20 mg by mouth 3 (three) times daily.   empagliflozin 10 MG Tabs tablet Commonly known as:  JARDIANCE Take 10 mg by mouth daily.   REMICADE 100 MG injection Generic drug:  inFLIXimab Inject into the vein every 8 (eight) weeks.   simvastatin 40 MG tablet Commonly known as:  ZOCOR Take 1 tablet (40 mg total) by mouth every evening.   SUMAtriptan 100 MG tablet Commonly known as:  IMITREX Take 100 mg by mouth as needed for migraine.   telmisartan-hydrochlorothiazide 80-12.5 MG tablet Commonly known as:  MICARDIS HCT Take 1 tablet by mouth daily.   traZODone 50 MG tablet Commonly known as:  DESYREL Take 0.5-1 tablets (25-50 mg total) by mouth at bedtime as needed for sleep.   triamcinolone cream 0.1 % Commonly known as:  KENALOG Apply 1 application topically 2 (two) times daily.       Allergies:  Allergies  Allergen Reactions  . Ampicillin Diarrhea  . Xigduo Xr [Dapagliflozin-Metformin Hcl Er]     Yeast infections.     Past Medical History, Surgical history, Social history, and Family History were reviewed and updated.  Review of Systems: All other 10 point review of systems is negative.   Physical Exam:  weight is 219 lb  12.8 oz (99.7 kg). Her oral temperature is 98 F (36.7 C). Her blood pressure is 135/90 and her pulse is 71. Her respiration is 18 and oxygen saturation is 97%.   Wt Readings from Last 3 Encounters:  09/29/16 219 lb 12.8 oz (99.7 kg)  05/31/16 224 lb (101.6 kg)  05/16/16 227 lb (103 kg)    Ocular: Sclerae unicteric, pupils equal, round and reactive to light Ear-nose-throat: Oropharynx clear, dentition fair Lymphatic: No cervical, supraclavicular or axillary adenopathy Lungs no rales or rhonchi, good excursion bilaterally Heart regular rate and rhythm, no murmur appreciated Abd soft, nontender,  positive bowel sounds, no liver or spleen tip palpated on exam, no fluid wave MSK no focal spinal tenderness, no joint edema Neuro: non-focal, well-oriented, appropriate affect Breasts: Deferred  Lab Results  Component Value Date   WBC 10.0 09/29/2016   HGB 14.1 09/29/2016   HCT 43.5 09/29/2016   MCV 87 09/29/2016   PLT 242 09/29/2016   Lab Results  Component Value Date   FERRITIN 270 (H) 05/16/2016   IRON 76 05/31/2016   TIBC 248 05/31/2016   UIBC 171 05/31/2016   IRONPCTSAT 31 05/31/2016   Lab Results  Component Value Date   RETICCTPCT 2.0 03/08/2015   RBC 5.00 09/29/2016   RETICCTABS 95.8 03/08/2015   No results found for: KPAFRELGTCHN, LAMBDASER, KAPLAMBRATIO No results found for: IGGSERUM, IGA, IGMSERUM No results found for: Ronnald Ramp, A1GS, A2GS, Violet Baldy, MSPIKE, SPEI   Chemistry      Component Value Date/Time   NA 137 05/16/2016 1005   K 3.9 05/16/2016 1005   CL 102 05/16/2016 1005   CO2 28 05/16/2016 1005   BUN 6 (L) 05/16/2016 1005   CREATININE 0.72 05/16/2016 1005      Component Value Date/Time   CALCIUM 9.1 05/16/2016 1005   ALKPHOS 60 05/16/2016 1005   AST 20 05/16/2016 1005   ALT 28 05/16/2016 1005   BILITOT 0.4 05/16/2016 1005      Impression and Plan: Stacie Cardenas is a 36 yo African female with a history of iron deficiency secondary to menometrorrhagia and crohn's disease. She is c/o fatigue and feeling tired all the time.  We have checked iron studies, vit B 12 and vit D levels.  We will bring her back in next week for infusion and or injection if needed.  I spent 15 minutes face to face counseling with the patient.  We will plan to see her back again in 4 months for follow-up and labs.  She will contact our office with any questions or concerns. We can certainly see her sooner if need be.    Eliezer Bottom, NP 4/20/201810:08 AM

## 2016-10-02 LAB — VITAMIN B12: VITAMIN B 12: 970 pg/mL (ref 232–1245)

## 2016-10-02 LAB — RETICULOCYTES: Reticulocyte Count: 1.7 % (ref 0.6–2.6)

## 2016-10-02 LAB — VITAMIN D 25 HYDROXY (VIT D DEFICIENCY, FRACTURES): VIT D 25 HYDROXY: 13.4 ng/mL — AB (ref 30.0–100.0)

## 2016-10-03 ENCOUNTER — Other Ambulatory Visit: Payer: Self-pay | Admitting: Family

## 2016-10-03 DIAGNOSIS — E559 Vitamin D deficiency, unspecified: Secondary | ICD-10-CM | POA: Insufficient documentation

## 2016-10-03 MED ORDER — ERGOCALCIFEROL 1.25 MG (50000 UT) PO CAPS: 50000.0000 [IU] | ORAL_CAPSULE | ORAL | 3 refills | Status: DC

## 2016-10-04 ENCOUNTER — Telehealth: Payer: Self-pay | Admitting: *Deleted

## 2016-10-04 NOTE — Telephone Encounter (Addendum)
Multiple attempts made to call patient. She has not returned nursing calls, but has scheduled iron infusion.   ----- Message from Eliezer Bottom, NP sent at 10/03/2016  1:04 PM EDT ----- Regarding: Vit D and B 12 B 12 looks good but Vit D is low! I have sent a script for Vit D supplement 50,000 units once a week. Thank you!!  Hansel Feinstein, NP  P Onc Nurse Hp        She will need one dose of IV iron this week please. Thank you! LOS sent to [facility].   Judson Roch

## 2016-10-10 ENCOUNTER — Other Ambulatory Visit: Payer: Self-pay | Admitting: Physician Assistant

## 2016-10-11 ENCOUNTER — Ambulatory Visit (HOSPITAL_BASED_OUTPATIENT_CLINIC_OR_DEPARTMENT_OTHER): Payer: 59

## 2016-10-11 VITALS — BP 133/77 | HR 72 | Temp 98.3°F | Resp 20

## 2016-10-11 DIAGNOSIS — D500 Iron deficiency anemia secondary to blood loss (chronic): Secondary | ICD-10-CM

## 2016-10-11 DIAGNOSIS — K5090 Crohn's disease, unspecified, without complications: Secondary | ICD-10-CM

## 2016-10-11 DIAGNOSIS — N921 Excessive and frequent menstruation with irregular cycle: Secondary | ICD-10-CM | POA: Diagnosis not present

## 2016-10-11 MED ORDER — SODIUM CHLORIDE 0.9 % IV SOLN
510.0000 mg | Freq: Once | INTRAVENOUS | Status: AC
  Administered 2016-10-11: 510 mg via INTRAVENOUS
  Filled 2016-10-11: qty 17

## 2016-10-11 MED ORDER — SODIUM CHLORIDE 0.9 % IV SOLN
Freq: Once | INTRAVENOUS | Status: AC
  Administered 2016-10-11: 10:00:00 via INTRAVENOUS

## 2016-10-11 NOTE — Patient Instructions (Signed)

## 2016-10-18 ENCOUNTER — Encounter: Payer: Self-pay | Admitting: Physician Assistant

## 2016-10-18 ENCOUNTER — Ambulatory Visit (INDEPENDENT_AMBULATORY_CARE_PROVIDER_SITE_OTHER): Payer: 59 | Admitting: Physician Assistant

## 2016-10-18 VITALS — BP 149/101 | HR 74 | Ht 64.0 in | Wt 220.0 lb

## 2016-10-18 DIAGNOSIS — I10 Essential (primary) hypertension: Secondary | ICD-10-CM | POA: Diagnosis not present

## 2016-10-18 DIAGNOSIS — E669 Obesity, unspecified: Secondary | ICD-10-CM | POA: Diagnosis not present

## 2016-10-18 DIAGNOSIS — IMO0001 Reserved for inherently not codable concepts without codable children: Secondary | ICD-10-CM

## 2016-10-18 DIAGNOSIS — F5101 Primary insomnia: Secondary | ICD-10-CM

## 2016-10-18 DIAGNOSIS — E118 Type 2 diabetes mellitus with unspecified complications: Secondary | ICD-10-CM | POA: Diagnosis not present

## 2016-10-18 DIAGNOSIS — E7800 Pure hypercholesterolemia, unspecified: Secondary | ICD-10-CM

## 2016-10-18 LAB — POCT GLYCOSYLATED HEMOGLOBIN (HGB A1C): Hemoglobin A1C: 5.5

## 2016-10-18 MED ORDER — LORCASERIN HCL 10 MG PO TABS: 1.0000 | ORAL_TABLET | Freq: Two times a day (BID) | ORAL | 1 refills | Status: DC

## 2016-10-18 MED ORDER — TELMISARTAN-HCTZ 80-12.5 MG PO TABS: 1.0000 | ORAL_TABLET | Freq: Every day | ORAL | 5 refills | Status: DC

## 2016-10-18 MED ORDER — TRAZODONE HCL 50 MG PO TABS: 25.0000 mg | ORAL_TABLET | Freq: Every evening | ORAL | 3 refills | Status: DC | PRN

## 2016-10-18 MED ORDER — EMPAGLIFLOZIN 10 MG PO TABS: 10.0000 mg | ORAL_TABLET | Freq: Every day | ORAL | 1 refills | Status: DC

## 2016-10-18 MED ORDER — EMPAGLIFLOZIN 10 MG PO TABS: 10.0000 mg | ORAL_TABLET | Freq: Every day | ORAL | 5 refills | Status: DC

## 2016-10-18 NOTE — Progress Notes (Signed)
   Subjective:    Patient ID: Stacie Cardenas, female    DOB: 1981-03-13, 36 y.o.   MRN: [id number]  HPI  Pt is a 36 yo female who presents to the clinic for 3 month DM follow up. She is taking jardiance daily. She is checking fasting sugars and ranging from 80-120. She denies any hypoglycemia. No open sores or wounds. She wants to lose weight. She is not exericsing or dieting.   HTN- did not take mediation today. Denies any headache, palpitations, CP, vision changes. .    Review of Systems  All other systems reviewed and are negative.      Objective:   Physical Exam  Constitutional: She is oriented to person, place, and time. She appears well-developed and well-nourished.  Obesity.   HENT:  Head: Normocephalic and atraumatic.  Neck: Normal range of motion. Neck supple. No thyromegaly present.  Cardiovascular: Normal rate, regular rhythm and normal heart sounds.   Pulmonary/Chest: Effort normal and breath sounds normal.  Neurological: She is alert and oriented to person, place, and time.  Psychiatric: She has a normal mood and affect. Her behavior is normal.          Assessment & Plan:  .Kelia was seen today for diabetes.  Diagnoses and all orders for this visit:  Controlled type 2 diabetes mellitus with complication, without long-term current use of insulin (HCC) -     POCT HgB A1C -     COMPLETE METABOLIC PANEL WITH GFR -     empagliflozin (JARDIANCE) 10 MG TABS tablet; Take 10 mg by mouth daily. -     Amb ref to Medical Nutrition Therapy-MNT  HYPERTENSION, BENIGN ESSENTIAL -     telmisartan-hydrochlorothiazide (MICARDIS HCT) 80-12.5 MG tablet; Take 1 tablet by mouth daily.  Pure hypercholesterolemia -     Lipid panel  Obesity, Class II, BMI 35-39.9, with comorbidity -     Lorcaserin HCl 10 MG TABS; Take 1 tablet by mouth 2 (two) times daily. -     Amb ref to Medical Nutrition Therapy-MNT  Primary insomnia -     Lorcaserin HCl 10 MG TABS; Take 1 tablet by mouth 2  (two) times daily.  Other orders -     Discontinue: empagliflozin (JARDIANCE) 10 MG TABS tablet; Take 10 mg by mouth daily. -     traZODone (DESYREL) 50 MG tablet; Take 0.5-1 tablets (25-50 mg total) by mouth at bedtime as needed for sleep.     .. Lab Results  Component Value Date   HGBA1C 5.5 10/18/2016   .Marland Kitchen Diabetic Foot Exam - Simple   Simple Foot Form Diabetic Foot exam was performed with the following findings:  Yes   Visual Inspection No deformities, no ulcerations, no other skin breakdown bilaterally:  Yes Sensation Testing Intact to touch and monofilament testing bilaterally:  Yes Pulse Check Comments    Discussed weight. Consider belviq. Discussed side effects. Follow up in 2 months.  nutrition referral made.   Discussed importance of taking BP medications. Pt did not take today. Come in 2 weeks for recheck via nurse visit in office.  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

## 2017-01-01 ENCOUNTER — Ambulatory Visit (INDEPENDENT_AMBULATORY_CARE_PROVIDER_SITE_OTHER): Payer: 59 | Admitting: Osteopathic Medicine

## 2017-01-01 ENCOUNTER — Telehealth: Payer: Self-pay | Admitting: *Deleted

## 2017-01-01 ENCOUNTER — Encounter: Payer: Self-pay | Admitting: Osteopathic Medicine

## 2017-01-01 ENCOUNTER — Telehealth: Payer: Self-pay | Admitting: Physician Assistant

## 2017-01-01 VITALS — BP 116/82 | HR 90 | Ht 64.0 in | Wt 221.0 lb

## 2017-01-01 DIAGNOSIS — K50919 Crohn's disease, unspecified, with unspecified complications: Secondary | ICD-10-CM

## 2017-01-01 DIAGNOSIS — R5383 Other fatigue: Secondary | ICD-10-CM

## 2017-01-01 DIAGNOSIS — D508 Other iron deficiency anemias: Secondary | ICD-10-CM | POA: Diagnosis not present

## 2017-01-01 DIAGNOSIS — E559 Vitamin D deficiency, unspecified: Secondary | ICD-10-CM | POA: Diagnosis not present

## 2017-01-01 DIAGNOSIS — E118 Type 2 diabetes mellitus with unspecified complications: Secondary | ICD-10-CM

## 2017-01-01 NOTE — Telephone Encounter (Signed)
Patient called request to know if she can get a lab order to have her iron and vitamin D Levels check'd anything that would pertain to her being tired all the time. Please adv. Thanks

## 2017-01-01 NOTE — Addendum Note (Signed)
Addended by: Beatris Ship L on: 01/01/2017 02:28 PM   Modules accepted: Orders

## 2017-01-01 NOTE — Telephone Encounter (Signed)
Called patient and advised that an appointment would be needed. ( see phone note from oncology office).patient transferred for scheduliing

## 2017-01-01 NOTE — Progress Notes (Signed)
HPI: Stacie Cardenas is a 36 y.o. female  who presents to [facility] today, 01/01/17,  for chief complaint of:  Chief Complaint  Patient presents with  . Fatigue   New fatigue. No night sweats,no fever, no recent new illness. Stable iron deficiency and Crohn's. DM2: sugars have been normal. Requests labs today.   Past medical history, surgical history, social history and family history reviewed.  Patient Active Problem List   Diagnosis Date Noted  . Obesity, Class II, BMI 35-39.9, with comorbidity 10/20/2016  . Vitamin D deficiency 10/03/2016  . Bilateral lower abdominal pain 09/07/2015  . Absolute anemia 09/07/2015  . Insomnia 06/20/2015  . Abscess of left axilla 12/31/2014  . Type 2 diabetes mellitus, controlled ([place]) 06/08/2014  . Elevated hemoglobin A1c 05/11/2014  . Crohn's disease ([place]) 03/30/2014  . Menorrhagia 07/03/2013  . Other iron deficiency anemias 03/17/2013  . Hyperlipidemia 10/14/2012  . Obesity, unspecified 10/14/2012  . MIGRAINE HEADACHE 06/28/2010  . HYPERLIPIDEMIA 04/27/2010  . IMPAIRED FASTING GLUCOSE 04/27/2010  . Iron deficiency anemia 01/21/2010  . WEIGHT GAIN 11/13/2008  . HYPERTENSION, BENIGN ESSENTIAL 04/26/2006  . SYMPTOM, FREQUENCY, URINARY 04/26/2006    Current medication list and allergy/intolerance information reviewed.   Current Outpatient Prescriptions on File Prior to Visit  Medication Sig Dispense Refill  . Adapalene-Benzoyl Peroxide 0.1-2.5 % gel Apply 1 application topically every morning.    Marland Kitchen ALPRAZolam (XANAX) 0.25 MG tablet Once daily as needed for acute anxiety. 30 tablet 1  . AMBULATORY NON FORMULARY MEDICATION BAYER CONTOUR NEXT TESTING STRIPS AND LANCETS 100 each 1  . azaTHIOprine (IMURAN) 50 MG tablet Take 1 tablet (50 mg total) by mouth daily.    Marland Kitchen BAYER CONTOUR NEXT TEST test strip CHECK BLOOD SAGUAR ONCE A DAY 100 each 11  . Blood Glucose Monitoring Suppl (BAYER CONTOUR NEXT MONITOR)  w/Device KIT CHECK BLOOD SUGAR ONCE A DAY 1 kit 0  . dicyclomine (BENTYL) 20 MG tablet Take 20 mg by mouth 3 (three) times daily.  1  . empagliflozin (JARDIANCE) 10 MG TABS tablet Take 10 mg by mouth daily. 90 tablet 1  . ergocalciferol (VITAMIN D2) 50000 units capsule Take 1 capsule (50,000 Units total) by mouth once a week. 8 capsule 3  . inFLIXimab (REMICADE) 100 MG injection Inject into the vein every 8 (eight) weeks.    . Lorcaserin HCl 10 MG TABS Take 1 tablet by mouth 2 (two) times daily. 60 tablet 1  . simvastatin (ZOCOR) 40 MG tablet Take 1 tablet (40 mg total) by mouth every evening. 30 tablet 11  . SUMAtriptan (IMITREX) 100 MG tablet Take 100 mg by mouth as needed for migraine.    Marland Kitchen telmisartan-hydrochlorothiazide (MICARDIS HCT) 80-12.5 MG tablet Take 1 tablet by mouth daily. 30 tablet 5  . traZODone (DESYREL) 50 MG tablet Take 0.5-1 tablets (25-50 mg total) by mouth at bedtime as needed for sleep. 30 tablet 3  . triamcinolone cream (KENALOG) 0.1 % Apply 1 application topically 2 (two) times daily. 45 g 0   No current facility-administered medications on file prior to visit.    Allergies  Allergen Reactions  . Ampicillin Diarrhea  . Xigduo Xr [Dapagliflozin-Metformin Hcl Er]     Yeast infections.       Review of Systems:  Constitutional: No recent illness  HEENT: No  headache, no vision change  Cardiac: No  chest pain, No  pressure, No palpitations  Respiratory:  No  shortness of breath. No  Cough  Gastrointestinal: No  abdominal pain, no change on bowel habits, no blood in stool  Musculoskeletal: No new myalgia/arthralgia  Skin: No  Rash  Hem/Onc: No  easy bruising/bleeding  Neurologic: No  weakness, No  Dizziness  Psychiatric: No  concerns with depression, No  concerns with anxiety  Exam:  BP 116/82   Pulse 90   Ht 5' 4"  (1.626 m)   Wt 221 lb (100.2 kg)   BMI 37.93 kg/m   Constitutional: VS see above. General Appearance: alert, well-developed,  well-nourished, NAD  Eyes: Normal lids and conjunctive, non-icteric sclera  Ears, Nose, Mouth, Throat: MMM, Normal external inspection ears/nares/mouth/lips/gums.  Neck: No masses, trachea midline.   Respiratory: Normal respiratory effort. no wheeze, no rhonchi, no rales  Cardiovascular: S1/S2 normal, no murmur, no rub/gallop auscultated. RRR.   Musculoskeletal: Gait normal. Symmetric and independent movement of all extremities  Neurological: Normal balance/coordination. No tremor.  Skin: warm, dry, intact.   Psychiatric: Normal judgment/insight. Normal mood and affect. Oriented x3.    Recent Results (from the past 2160 hour(s))  POCT HgB A1C     Status: None   Collection Time: 10/18/16  1:19 PM  Result Value Ref Range   Hemoglobin A1C 5.5       ASSESSMENT/PLAN:   Other fatigue  Other iron deficiency anemias - Plan: CBC with Differential/Platelet, Iron and TIBC, Ferritin  Controlled type 2 diabetes mellitus with complication, without long-term current use of insulin ([place]) - Plan: COMPLETE METABOLIC PANEL WITH GFR  Crohn's disease with complication, unspecified gastrointestinal tract location ([place]) - Plan: CBC with Differential/Platelet, COMPLETE METABOLIC PANEL WITH GFR, TSH  Vitamin D deficiency - Plan: VITAMIN D 25 Hydroxy (Vit-D Deficiency, Fractures)     Follow-up plan: Return for routine follow-up with Jade as directed .  Visit summary with medication list and pertinent instructions was printed for patient to review, alert [place] if any changes needed. All questions at time of visit were answered - patient instructed to contact office with any additional concerns. ER/RTC precautions were reviewed with the patient and understanding verbalized.

## 2017-01-01 NOTE — Telephone Encounter (Signed)
Patient c/o severe fatigue. She states she can't get anything done because she feels so poorly. She has an appointment in August but wants to know if she can be seen today.  Instructed patient that in cases like these we can bring her in for lab work early to assess her iron and vitamin D levels to determine if her symptoms are related. She wants to be seen by a provider. Explained that without lab results, we cannot formulate a treatment plan. Labs need to be collected and if issues are seen we will bring her in for a provider visit and possible treatment.  Patient states she will see her PCP instead and just have them draw the lab work. Asked patient to have them draw iron and vitamin D levels. She agreed.

## 2017-01-02 LAB — COMPREHENSIVE METABOLIC PANEL
A/G RATIO: 1.3 (ref 1.2–2.2)
ALK PHOS: 63 IU/L (ref 39–117)
ALT: 23 IU/L (ref 0–32)
AST: 14 IU/L (ref 0–40)
Albumin: 4 g/dL (ref 3.5–5.5)
BUN/Creatinine Ratio: 9 (ref 9–23)
BUN: 6 mg/dL (ref 6–20)
Bilirubin Total: 0.3 mg/dL (ref 0.0–1.2)
CHLORIDE: 102 mmol/L (ref 96–106)
CO2: 25 mmol/L (ref 20–29)
Calcium: 9.6 mg/dL (ref 8.7–10.2)
Creatinine, Ser: 0.69 mg/dL (ref 0.57–1.00)
GFR calc Af Amer: 130 mL/min/{1.73_m2} (ref >59)
GFR calc non Af Amer: 113 mL/min/{1.73_m2} (ref >59)
GLOBULIN, TOTAL: 3.1 g/dL (ref 1.5–4.5)
Glucose: 119 mg/dL — ABNORMAL HIGH (ref 65–99)
POTASSIUM: 4 mmol/L (ref 3.5–5.2)
SODIUM: 139 mmol/L (ref 134–144)
Total Protein: 7.1 g/dL (ref 6.0–8.5)

## 2017-01-02 LAB — CBC WITH DIFFERENTIAL/PLATELET
BASOS: 0 %
Basophils Absolute: 0 10*3/uL (ref 0.0–0.2)
EOS (ABSOLUTE): 0.1 10*3/uL (ref 0.0–0.4)
EOS: 1 %
HEMATOCRIT: 38.2 % (ref 34.0–46.6)
HEMOGLOBIN: 12.8 g/dL (ref 11.1–15.9)
IMMATURE GRANULOCYTES: 0 %
Immature Grans (Abs): 0 10*3/uL (ref 0.0–0.1)
LYMPHS ABS: 4.8 10*3/uL — AB (ref 0.7–3.1)
Lymphs: 48 %
MCH: 28.4 pg (ref 26.6–33.0)
MCHC: 33.5 g/dL (ref 31.5–35.7)
MCV: 85 fL (ref 79–97)
MONOCYTES: 8 %
Monocytes Absolute: 0.8 10*3/uL (ref 0.1–0.9)
Neutrophils Absolute: 4.2 10*3/uL (ref 1.4–7.0)
Neutrophils: 43 %
Platelets: 223 10*3/uL (ref 150–379)
RBC: 4.51 x10E6/uL (ref 3.77–5.28)
RDW: 14.9 % (ref 12.3–15.4)
WBC: 9.8 10*3/uL (ref 3.4–10.8)

## 2017-01-02 LAB — FERRITIN: FERRITIN: 340 ng/mL — AB (ref 15–150)

## 2017-01-02 LAB — IRON AND TIBC
IRON SATURATION: 36 % (ref 15–55)
IRON: 83 ug/dL (ref 27–159)
TIBC: 231 ug/dL — AB (ref 250–450)
UIBC: 148 ug/dL (ref 131–425)

## 2017-01-02 LAB — VITAMIN D 25 HYDROXY (VIT D DEFICIENCY, FRACTURES): VIT D 25 HYDROXY: 13.2 ng/mL — AB (ref 30.0–100.0)

## 2017-01-02 LAB — TSH: TSH: 1.31 u[IU]/mL (ref 0.450–4.500)

## 2017-01-04 ENCOUNTER — Other Ambulatory Visit: Payer: Self-pay | Admitting: *Deleted

## 2017-01-04 ENCOUNTER — Other Ambulatory Visit: Payer: Self-pay | Admitting: Family

## 2017-01-04 DIAGNOSIS — E559 Vitamin D deficiency, unspecified: Secondary | ICD-10-CM

## 2017-01-04 NOTE — Progress Notes (Unsigned)
[place]

## 2017-02-02 ENCOUNTER — Ambulatory Visit (HOSPITAL_BASED_OUTPATIENT_CLINIC_OR_DEPARTMENT_OTHER): Payer: 59 | Admitting: Hematology & Oncology

## 2017-02-02 ENCOUNTER — Other Ambulatory Visit: Payer: Self-pay | Admitting: *Deleted

## 2017-02-02 ENCOUNTER — Other Ambulatory Visit (HOSPITAL_BASED_OUTPATIENT_CLINIC_OR_DEPARTMENT_OTHER): Payer: 59

## 2017-02-02 VITALS — BP 132/83 | HR 70 | Temp 98.4°F | Resp 18 | Wt 224.0 lb

## 2017-02-02 DIAGNOSIS — R635 Abnormal weight gain: Secondary | ICD-10-CM

## 2017-02-02 DIAGNOSIS — D510 Vitamin B12 deficiency anemia due to intrinsic factor deficiency: Secondary | ICD-10-CM

## 2017-02-02 DIAGNOSIS — D508 Other iron deficiency anemias: Secondary | ICD-10-CM

## 2017-02-02 DIAGNOSIS — K5000 Crohn's disease of small intestine without complications: Secondary | ICD-10-CM

## 2017-02-02 DIAGNOSIS — D500 Iron deficiency anemia secondary to blood loss (chronic): Secondary | ICD-10-CM | POA: Diagnosis not present

## 2017-02-02 DIAGNOSIS — D509 Iron deficiency anemia, unspecified: Secondary | ICD-10-CM

## 2017-02-02 DIAGNOSIS — N921 Excessive and frequent menstruation with irregular cycle: Secondary | ICD-10-CM | POA: Diagnosis not present

## 2017-02-02 DIAGNOSIS — K5090 Crohn's disease, unspecified, without complications: Secondary | ICD-10-CM | POA: Diagnosis not present

## 2017-02-02 DIAGNOSIS — E559 Vitamin D deficiency, unspecified: Secondary | ICD-10-CM

## 2017-02-02 LAB — CMP (CANCER CENTER ONLY)
ALBUMIN: 3.3 g/dL (ref 3.3–5.5)
ALT(SGPT): 29 U/L (ref 10–47)
AST: 25 U/L (ref 11–38)
Alkaline Phosphatase: 66 U/L (ref 26–84)
BILIRUBIN TOTAL: 0.6 mg/dL (ref 0.20–1.60)
BUN, Bld: 8 mg/dL (ref 7–22)
CO2: 31 meq/L (ref 18–33)
CREATININE: 0.9 mg/dL (ref 0.6–1.2)
Calcium: 9.4 mg/dL (ref 8.0–10.3)
Chloride: 105 mEq/L (ref 98–108)
GLUCOSE: 119 mg/dL — AB (ref 73–118)
Potassium: 4.4 mEq/L (ref 3.3–4.7)
SODIUM: 138 meq/L (ref 128–145)
Total Protein: 7.2 g/dL (ref 6.4–8.1)

## 2017-02-02 LAB — CBC WITH DIFFERENTIAL (CANCER CENTER ONLY)
BASO#: 0 10*3/uL (ref 0.0–0.2)
BASO%: 0.1 % (ref 0.0–2.0)
EOS ABS: 0.1 10*3/uL (ref 0.0–0.5)
EOS%: 1.4 % (ref 0.0–7.0)
HCT: 38 % (ref 34.8–46.6)
HGB: 12.5 g/dL (ref 11.6–15.9)
LYMPH#: 3.2 10*3/uL (ref 0.9–3.3)
LYMPH%: 45.8 % (ref 14.0–48.0)
MCH: 29.3 pg (ref 26.0–34.0)
MCHC: 32.9 g/dL (ref 32.0–36.0)
MCV: 89 fL (ref 81–101)
MONO#: 0.5 10*3/uL (ref 0.1–0.9)
MONO%: 7.5 % (ref 0.0–13.0)
NEUT#: 3.2 10*3/uL (ref 1.5–6.5)
NEUT%: 45.2 % (ref 39.6–80.0)
PLATELETS: 212 10*3/uL (ref 145–400)
RBC: 4.26 10*6/uL (ref 3.70–5.32)
RDW: 13.8 % (ref 11.1–15.7)
WBC: 7.1 10*3/uL (ref 3.9–10.0)

## 2017-02-02 LAB — IRON AND TIBC
%SAT: 29 % (ref 21–57)
Iron: 63 ug/dL (ref 41–142)
TIBC: 220 ug/dL — ABNORMAL LOW (ref 236–444)
UIBC: 157 ug/dL (ref 120–384)

## 2017-02-02 LAB — FERRITIN: Ferritin: 227 ng/ml (ref 9–269)

## 2017-02-02 LAB — RETICULOCYTES: Reticulocyte Count: 1.8 % (ref 0.6–2.6)

## 2017-02-02 NOTE — Progress Notes (Signed)
Hematology and Oncology Follow Up Visit  Stacie Cardenas [id number] [birth year] 36 y.o. 02/02/2017   Principle Diagnosis:   Iron deficiency anemia  Crohn's disease with intermittent GI bleeding  Menometrorrhagia  Current Therapy:   IV iron as indicated - last dose given on 10/11/2016    Interim History:  Stacie Cardenas is here today for follow-up. She just is not feeling well. I'm not sure as to why she is not feeling well.  She has had no obvious flareups of the Crohn's disease. She is on Remicade for this. She is also on Imuran. His cell oh she's not taking this on a regular basis. Establish is not taking quite a few medications on a regular basis. She has diabetes. She says that her blood sugars are not been all that bad.  [name] had no pain. There's been no obvious change in bowel or bladder habits. He's had no melena or bright red blood per rectum.  We saw her today, her iron studies showed a ferritin of 227 and iron saturation 29%.  [name] had no fever. Her weight has been holding pretty steady.  Her thyroid has been checked. This also is okay. Patient does have a low vitamin D level., Sure if this would be causing all of her fatigue.  She is still working. She is having a tough time trying to get through work.  [name] had no rashes. [name] had no headache. There's been no visual changes.  Overall, her performance status is ECOG 1.  Medications:  Current Outpatient Prescriptions:  Marland Kitchen  Vitamin D, Ergocalciferol, (DRISDOL) 50000 units CAPS capsule, Take by mouth., Disp: , Rfl:  .  Adapalene-Benzoyl Peroxide 0.1-2.5 % gel, Apply 1 application topically every morning., Disp: , Rfl:  .  ALPRAZolam (XANAX) 0.25 MG tablet, Once daily as needed for acute anxiety., Disp: 30 tablet, Rfl: 1 .  AMBULATORY NON FORMULARY MEDICATION, BAYER CONTOUR NEXT TESTING STRIPS AND LANCETS, Disp: 100 each, Rfl: 1 .  azaTHIOprine (IMURAN) 50 MG tablet, Take 1 tablet (50 mg total) by mouth daily., Disp: ,  Rfl:  .  BAYER CONTOUR NEXT TEST test strip, CHECK BLOOD SAGUAR ONCE A DAY, Disp: 100 each, Rfl: 11 .  Blood Glucose Monitoring Suppl (BAYER CONTOUR NEXT MONITOR) w/Device KIT, CHECK BLOOD SUGAR ONCE A DAY, Disp: 1 kit, Rfl: 0 .  dicyclomine (BENTYL) 20 MG tablet, Take 20 mg by mouth 3 (three) times daily., Disp: , Rfl: 1 .  empagliflozin (JARDIANCE) 10 MG TABS tablet, Take 10 mg by mouth daily., Disp: 90 tablet, Rfl: 1 .  inFLIXimab (REMICADE) 100 MG injection, Inject into the vein every 8 (eight) weeks., Disp: , Rfl:  .  Lorcaserin HCl 10 MG TABS, Take 1 tablet by mouth 2 (two) times daily., Disp: 60 tablet, Rfl: 1 .  simvastatin (ZOCOR) 40 MG tablet, Take 1 tablet (40 mg total) by mouth every evening., Disp: 30 tablet, Rfl: 11 .  SUMAtriptan (IMITREX) 100 MG tablet, Take 100 mg by mouth as needed for migraine., Disp: , Rfl:  .  telmisartan-hydrochlorothiazide (MICARDIS HCT) 80-12.5 MG tablet, Take 1 tablet by mouth daily., Disp: 30 tablet, Rfl: 5 .  traZODone (DESYREL) 50 MG tablet, Take 0.5-1 tablets (25-50 mg total) by mouth at bedtime as needed for sleep., Disp: 30 tablet, Rfl: 3 .  triamcinolone cream (KENALOG) 0.1 %, Apply 1 application topically 2 (two) times daily., Disp: 45 g, Rfl: 0  Allergies:  Allergies  Allergen Reactions  . Ampicillin Diarrhea  . Launa Grill [  Dapagliflozin-Metformin Hcl Er]     Yeast infections.     Past Medical History, Surgical history, Social history, and Family History were reviewed and updated.  Review of Systems: Review of Systems  Constitutional: Negative for appetite change, fatigue, fever and unexpected weight change.  HENT:   Negative for lump/mass, mouth sores, sore throat and trouble swallowing.   Respiratory: Negative for cough, hemoptysis and shortness of breath.   Cardiovascular: Negative for leg swelling and palpitations.  Gastrointestinal: Negative for abdominal distention, abdominal pain, blood in stool, constipation, diarrhea, nausea and  vomiting.  Genitourinary: Negative for bladder incontinence, dysuria, frequency and hematuria.   Musculoskeletal: Negative for arthralgias, back pain, gait problem and myalgias.  Skin: Negative for itching and rash.  Neurological: Negative for dizziness, extremity weakness, gait problem, headaches, numbness, seizures and speech difficulty.  Hematological: Does not bruise/bleed easily.  Psychiatric/Behavioral: Negative for depression and sleep disturbance. The patient is not nervous/anxious.     Physical Exam:  weight is 224 lb (101.6 kg). Her oral temperature is 98.4 F (36.9 C). Her blood pressure is 132/83 and her pulse is 70. Her respiration is 18 and oxygen saturation is 100%.   Wt Readings from Last 3 Encounters:  02/02/17 224 lb (101.6 kg)  01/01/17 221 lb (100.2 kg)  10/18/16 220 lb (99.8 kg)     Physical Exam  Constitutional: She is oriented to person, place, and time.  HENT:  Head: Normocephalic and atraumatic.  Mouth/Throat: Oropharynx is clear and moist.  Eyes: Pupils are equal, round, and reactive to light. EOM are normal.  Neck: Normal range of motion.  Cardiovascular: Normal rate, regular rhythm and normal heart sounds.   Pulmonary/Chest: Effort normal and breath sounds normal.  Abdominal: Soft. Bowel sounds are normal.  Musculoskeletal: Normal range of motion. She exhibits no edema, tenderness or deformity.  Lymphadenopathy:    She has no cervical adenopathy.  Neurological: She is alert and oriented to person, place, and time.  Skin: Skin is warm and dry. No rash noted. No erythema.  Psychiatric: She has a normal mood and affect. Her behavior is normal. Judgment and thought content normal.  Vitals reviewed.   Lab Results  Component Value Date   WBC 7.1 02/02/2017   HGB 12.5 02/02/2017   HCT 38.0 02/02/2017   MCV 89 02/02/2017   PLT 212 02/02/2017     Chemistry      Component Value Date/Time   NA 138 02/02/2017 1015   K 4.4 02/02/2017 1015   CL 105  02/02/2017 1015   CO2 31 02/02/2017 1015   BUN 8 02/02/2017 1015   CREATININE 0.9 02/02/2017 1015      Component Value Date/Time   CALCIUM 9.4 02/02/2017 1015   ALKPHOS 66 02/02/2017 1015   AST 25 02/02/2017 1015   ALT 29 02/02/2017 1015   BILITOT 0.60 02/02/2017 1015         Impression and Plan: Ms. Tiedt  is a 36 year old African-American female. She has Crohn's disease. He has had iron deficiency anemia.  Her labs look pretty good today. I will get her blood under the microscope. I did not see anything that looked suspicious. Her iron studies clearly are not low.  Her blood sugar was adequate. Her calcium was not high. Her sodium was not low. She had normal liver function studies.  I'm not sure why she is not feeling well. I cannot find anything on her physical exam that would be suspicious.  I will like to see her  back in 3 months.  If she worsens between now and her next visit, then we may have to consider scans.   Volanda Napoleon, MD 8/24/201811:26 AM

## 2017-02-03 LAB — VITAMIN D 25 HYDROXY (VIT D DEFICIENCY, FRACTURES): Vitamin D, 25-Hydroxy: 29.6 ng/mL — ABNORMAL LOW (ref 30.0–100.0)

## 2017-02-03 LAB — VITAMIN B12: Vitamin B12: 959 pg/mL (ref 232–1245)

## 2017-02-05 ENCOUNTER — Telehealth: Payer: Self-pay | Admitting: *Deleted

## 2017-02-05 DIAGNOSIS — D508 Other iron deficiency anemias: Secondary | ICD-10-CM

## 2017-02-05 NOTE — Telephone Encounter (Addendum)
Patient is aware of results. She is currently taking vitamin D 50,000 units twice a week. She just started this dosing a few weeks ago. We will check the level again next time she is seen to evaluate level  ----- Message from Volanda Napoleon, MD sent at 02/05/2017  8:11 AM EDT ----- Call - vit D is low!!  Please take 2000 units a day!!  pete

## 2017-02-08 ENCOUNTER — Encounter: Payer: Self-pay | Admitting: Family Medicine

## 2017-02-08 ENCOUNTER — Ambulatory Visit (INDEPENDENT_AMBULATORY_CARE_PROVIDER_SITE_OTHER): Payer: 59 | Admitting: Family Medicine

## 2017-02-08 VITALS — BP 155/93 | HR 71 | Temp 98.0°F | Wt 227.0 lb

## 2017-02-08 DIAGNOSIS — J029 Acute pharyngitis, unspecified: Secondary | ICD-10-CM | POA: Diagnosis not present

## 2017-02-08 DIAGNOSIS — L739 Follicular disorder, unspecified: Secondary | ICD-10-CM

## 2017-02-08 LAB — POCT RAPID STREP A (OFFICE): RAPID STREP A SCREEN: NEGATIVE

## 2017-02-08 MED ORDER — MUPIROCIN 2 % EX OINT: TOPICAL_OINTMENT | CUTANEOUS | 3 refills | Status: DC

## 2017-02-08 NOTE — Patient Instructions (Signed)
Thank you for coming in today. Apply the antibiotic ointment to the nostril 3x daily  Let me know if you worsen.   For Sore throat I think this is viral.  Continue tylenol for pain and fever.  Dayquil contains tylenol make sure to not take more than 1019m 4x daily.   Back up plan for oral antibiotics.  Call or send a message if you think you need them.   Call or go to the emergency room if you get worse, have trouble breathing, have chest pains, or palpitations.

## 2017-02-08 NOTE — Progress Notes (Signed)
Stacie Cardenas is a 36 y.o. female who presents to [facility] today for sore throat and runny nose. This has been present for about 1-2 days. She has tried some over-the-counter medicines which have helped a little. She notes DayQuil seems to be the most effective.  Additionally patient notes a small sore on the inner part of her right nostril. [name] been using Neosporin for the last week which have not been effective. She feels well otherwise.   Past Medical History:  Diagnosis Date  . Crohn's disease ([place]) 03/30/2014   Biopsies done 03/23/14.  Digestive health management.    . Hypercholesterolemia   . Hypertension   . IBS (irritable bowel syndrome)   . Other specified iron deficiency anemias 03/17/2013  . Type 2 diabetes mellitus, controlled ([place]) 06/08/2014   Past Surgical History:  Procedure Laterality Date  . CESAREAN SECTION     Social History  Substance Use Topics  . Smoking status: Never Smoker  . Smokeless tobacco: Never Used     Comment: never used tobacco  . Alcohol use No   family history is not on file.  ROS as above:  Medications: Current Outpatient Prescriptions  Medication Sig Dispense Refill  . Adapalene-Benzoyl Peroxide 0.1-2.5 % gel Apply 1 application topically every morning.    Marland Kitchen ALPRAZolam (XANAX) 0.25 MG tablet Once daily as needed for acute anxiety. 30 tablet 1  . AMBULATORY NON FORMULARY MEDICATION BAYER CONTOUR NEXT TESTING STRIPS AND LANCETS 100 each 1  . azaTHIOprine (IMURAN) 50 MG tablet Take 1 tablet (50 mg total) by mouth daily.    Marland Kitchen BAYER CONTOUR NEXT TEST test strip CHECK BLOOD SAGUAR ONCE A DAY 100 each 11  . Blood Glucose Monitoring Suppl (BAYER CONTOUR NEXT MONITOR) w/Device KIT CHECK BLOOD SUGAR ONCE A DAY 1 kit 0  . dicyclomine (BENTYL) 20 MG tablet Take 20 mg by mouth 3 (three) times daily.  1  . empagliflozin  (JARDIANCE) 10 MG TABS tablet Take 10 mg by mouth daily. 90 tablet 1  . inFLIXimab (REMICADE) 100 MG injection Inject into the vein every 8 (eight) weeks.    . Lorcaserin HCl 10 MG TABS Take 1 tablet by mouth 2 (two) times daily. 60 tablet 1  . simvastatin (ZOCOR) 40 MG tablet Take 1 tablet (40 mg total) by mouth every evening. 30 tablet 11  . SUMAtriptan (IMITREX) 100 MG tablet Take 100 mg by mouth as needed for migraine.    Marland Kitchen telmisartan-hydrochlorothiazide (MICARDIS HCT) 80-12.5 MG tablet Take 1 tablet by mouth daily. 30 tablet 5  . traZODone (DESYREL) 50 MG tablet Take 0.5-1 tablets (25-50 mg total) by mouth at bedtime as needed for sleep. 30 tablet 3  . triamcinolone cream (KENALOG) 0.1 % Apply 1 application topically 2 (two) times daily. 45 g 0  . Vitamin D, Ergocalciferol, (DRISDOL) 50000 units CAPS capsule Take by mouth.    . mupirocin ointment (BACTROBAN) 2 % Apply to affected area TID for 7 days. 30 g 3   No current facility-administered medications for this visit.    Allergies  Allergen Reactions  . Ampicillin Diarrhea  . Xigduo Xr [Dapagliflozin-Metformin Hcl Er]     Yeast infections.     Health Maintenance Health Maintenance  Topic Date Due  . INFLUENZA VACCINE  02/08/2018 (Originally 01/10/2017)  . OPHTHALMOLOGY EXAM  03/03/2017  . HEMOGLOBIN A1C  04/20/2017  . FOOT EXAM  10/18/2017  . PAP SMEAR  02/11/2018  . PNEUMOCOCCAL POLYSACCHARIDE VACCINE (2) 08/14/2019  . TETANUS/TDAP  03/14/2022  . HIV Screening  Completed     Exam:  BP (!) 155/93   Pulse 71   Temp 98 F (36.7 C) (Oral)   Wt 227 lb (103 kg)   BMI 38.96 kg/m  Gen: Well NAD HEENT: EOMI,  MMM  Small erythematous papule inner right nostril.  Tender to touch.  Clear nasal discharge. Nontender maxillary sinus. Normal tympanic membranes bilaterally Lungs: Normal work of breathing. CTABL Heart: RRR no MRG Abd: NABS, Soft. Nondistended, Nontender Exts: Brisk capillary refill, warm and well perfused.     Results for orders placed or performed in visit on 02/08/17 (from the past 72 hour(s))  POCT rapid strep A     Status: Normal   Collection Time: 02/08/17  3:45 PM  Result Value Ref Range   Rapid Strep A Screen Negative Negative   No results found.    Assessment and Plan: 36 y.o. female with  Viral URI: Continue OTC medications. Watchful waiting.   Nasal Folliculitis: Treat with bactroban ointment. RTC prn.    Orders Placed This Encounter  Procedures  . POCT rapid strep A   Meds ordered this encounter  Medications  . mupirocin ointment (BACTROBAN) 2 %    Sig: Apply to affected area TID for 7 days.    Dispense:  30 g    Refill:  3     Discussed warning signs or symptoms. Please see discharge instructions. Patient expresses understanding.  I spent 25 minutes with this patient, greater than 50% was face-to-face time counseling regarding DDx, plan medication side effects and back up plan. Marland Kitchen

## 2017-02-09 ENCOUNTER — Telehealth: Payer: Self-pay | Admitting: Family Medicine

## 2017-02-09 ENCOUNTER — Other Ambulatory Visit: Payer: Self-pay | Admitting: *Deleted

## 2017-02-09 MED ORDER — FLUCONAZOLE 150 MG PO TABS: 150.0000 mg | ORAL_TABLET | Freq: Once | ORAL | 1 refills | Status: AC

## 2017-02-09 MED ORDER — AZITHROMYCIN 250 MG PO TABS: ORAL_TABLET | ORAL | 0 refills | Status: DC

## 2017-02-09 NOTE — Telephone Encounter (Signed)
Patient advise that she gets infusions and had a schedule appointment and told Dr. Georgina Snell and couldn't take antibiotic and get infusion. So she has postpone infusion appt and would like to get antibiotic called in today so she can get started. Thanks

## 2017-02-09 NOTE — Telephone Encounter (Signed)
Pt notified of rx. 

## 2017-02-09 NOTE — Telephone Encounter (Signed)
Patient called seen Dr. Georgina Snell 02/08/17 and would like to get antibiotic called into [facility] in [place] on [address]. Pt would also like to get some Diflucan called into in case she gets a yeast infection. Today please

## 2017-02-09 NOTE — Telephone Encounter (Signed)
Ok I sent zpak to pharmacy.

## 2017-02-14 ENCOUNTER — Ambulatory Visit (INDEPENDENT_AMBULATORY_CARE_PROVIDER_SITE_OTHER): Payer: 59 | Admitting: Physician Assistant

## 2017-02-14 ENCOUNTER — Encounter: Payer: Self-pay | Admitting: Physician Assistant

## 2017-02-14 VITALS — BP 140/101 | HR 79 | Temp 97.8°F | Wt 223.0 lb

## 2017-02-14 DIAGNOSIS — R5382 Chronic fatigue, unspecified: Secondary | ICD-10-CM

## 2017-02-14 DIAGNOSIS — R109 Unspecified abdominal pain: Secondary | ICD-10-CM | POA: Diagnosis not present

## 2017-02-14 DIAGNOSIS — K5000 Crohn's disease of small intestine without complications: Secondary | ICD-10-CM | POA: Diagnosis not present

## 2017-02-14 DIAGNOSIS — I10 Essential (primary) hypertension: Secondary | ICD-10-CM | POA: Diagnosis not present

## 2017-02-14 DIAGNOSIS — J329 Chronic sinusitis, unspecified: Secondary | ICD-10-CM

## 2017-02-14 DIAGNOSIS — D509 Iron deficiency anemia, unspecified: Secondary | ICD-10-CM

## 2017-02-14 DIAGNOSIS — E118 Type 2 diabetes mellitus with unspecified complications: Secondary | ICD-10-CM

## 2017-02-14 DIAGNOSIS — G9332 Myalgic encephalomyelitis/chronic fatigue syndrome: Secondary | ICD-10-CM

## 2017-02-14 LAB — POCT URINALYSIS DIPSTICK
Bilirubin, UA: NEGATIVE
KETONES UA: NEGATIVE
Leukocytes, UA: NEGATIVE
Nitrite, UA: NEGATIVE
Protein, UA: NEGATIVE
RBC UA: NEGATIVE
SPEC GRAV UA: 1.02 (ref 1.010–1.025)
UROBILINOGEN UA: 1 U/dL
pH, UA: 7 (ref 5.0–8.0)

## 2017-02-14 LAB — POCT GLYCOSYLATED HEMOGLOBIN (HGB A1C): HEMOGLOBIN A1C: 6.9

## 2017-02-14 MED ORDER — BUPROPION HCL ER (XL) 150 MG PO TB24: 150.0000 mg | ORAL_TABLET | Freq: Every day | ORAL | 1 refills | Status: DC

## 2017-02-14 MED ORDER — METHYLPREDNISOLONE SODIUM SUCC 125 MG IJ SOLR
125.0000 mg | Freq: Once | INTRAMUSCULAR | Status: AC
  Administered 2017-02-14: 125 mg via INTRAMUSCULAR

## 2017-02-14 NOTE — Progress Notes (Signed)
Subjective:    Patient ID: Stacie Cardenas, female    DOB: [birth year], 36 y.o.   MRN: [id number]  HPI  Pt is a 36 yo female who presents to the clinic for 3 month follow up.   Sinusitis- she was recently seen by Dr. Georgina Snell. He treated virally but she did not get better. He sent over zpak. She is finished but continues to have a lot of pressure and ear popping.   Chron's- remicade infusion are working great at keeping flares away. She has not been able to have the last one due to sinusitis. She is concerned for chrons flare.   DM- not checking sugars. Previously been controlled. She admits has missed some Jardiance doses. No hypoglycemia. No open sores or wounds.   Pt sees hematology but has not needed iron infusion recently. 2 weeks ago B12 looked great. Vitamin D still a little low but better than as been.   Patient is very concerned at the level of fatigue she has. She has absolutely no energy. She sleeps well at night. She feels like she cannot keep going. She feels completely drained. No fever, chills, SOB, cough. Pt denies any depression.   .. Active Ambulatory Problems    Diagnosis Date Noted  . HYPERLIPIDEMIA 04/27/2010  . Iron deficiency anemia 01/21/2010  . HYPERTENSION, BENIGN ESSENTIAL 04/26/2006  . WEIGHT GAIN 11/13/2008  . MIGRAINE HEADACHE 06/28/2010  . Menorrhagia 07/03/2013  . Crohn's disease ([place]) 03/30/2014  . Type 2 diabetes mellitus, controlled ([place]) 06/08/2014  . Insomnia 06/20/2015  . Absolute anemia 09/07/2015  . Vitamin D deficiency 10/03/2016  . Obesity, Class II, BMI 35-39.9, with comorbidity 10/20/2016  . Chronic fatigue syndrome 02/18/2017   Resolved Ambulatory Problems    Diagnosis Date Noted  . [employer] GLAND 02/19/2009  . CARBUNCLE/FURUNCLE NOS 04/26/2006  . SKIN RASH 11/13/2008  . SYMPTOM, FREQUENCY, URINARY 04/26/2006  . Abdominal pain, epigastric 04/27/2010  . Impaired fasting glucose 04/27/2010  . 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 VAGINAL DISCHARGE 08/10/2010  . FOLLICULITIS 34/91/7915  . SORE THROAT 01/10/2011  . ABDOMINAL PAIN 01/10/2011  . Hyperlipidemia 10/14/2012  . Obesity, unspecified 10/14/2012  . Other iron deficiency anemias 03/17/2013  . Elevated hemoglobin A1c 05/11/2014  . Abscess of left axilla 12/31/2014  . Bilateral lower abdominal pain 09/07/2015   Past Medical History:  Diagnosis Date  . Crohn's disease ([place]) 03/30/2014  . Hypercholesterolemia   . Hypertension   . IBS (irritable bowel syndrome)   . Other specified iron deficiency anemias 03/17/2013  . Type 2 diabetes mellitus, controlled ([place]) 06/08/2014         Review of Systems See HPI.     Objective:   Physical Exam  Constitutional: She is oriented to person, place, and time. She appears well-developed and well-nourished.  HENT:  Head: Normocephalic and atraumatic.  Cardiovascular: Normal rate, regular rhythm and normal heart sounds.   Pulmonary/Chest: Effort normal and breath sounds normal. She has no wheezes.  No CVA tenderness.   Abdominal: Soft. Bowel sounds are normal. She exhibits no distension and no mass. There is no tenderness. There is no rebound and no guarding.  Neurological: She is alert and oriented to person, place, and time.  Psychiatric: She has a normal mood and affect. Her behavior is normal.          Assessment & Plan:   Marland KitchenMarland KitchenNanie was seen today for fatigue and dysuria.  Diagnoses and all orders for  this visit:  Sinusitis, unspecified chronicity, unspecified location -     methylPREDNISolone sodium succinate (SOLU-MEDROL) 125 mg/2 mL injection 125 mg; Inject 2 mLs (125 mg total) into the muscle once.  Flank pain -     Urinalysis Dipstick -     Cancel: Urine Culture  Controlled type 2 diabetes mellitus with complication, without long-term current use of insulin (HCC) -     POCT HgB A1C  HYPERTENSION, BENIGN ESSENTIAL  Crohn's disease of small intestine  without complication (HCC)  Iron deficiency anemia, unspecified iron deficiency anemia type  Chronic fatigue syndrome -     buPROPion (WELLBUTRIN XL) 150 MG 24 hr tablet; Take 1 tablet (150 mg total) by mouth daily.    .. Depression screen [place] 2/9 09/24/2013 06/23/2013  Decreased Interest 0 0  Down, Depressed, Hopeless 0 0  PHQ - 2 Score 0 0   .Marland Kitchen Results for orders placed or performed in visit on 02/14/17  Urinalysis Dipstick  Result Value Ref Range   Color, UA yellow    Clarity, UA clear    Glucose, UA 124m/dl    Bilirubin, UA negative    Ketones, UA negative    Spec Grav, UA 1.020 1.010 - 1.025   Blood, UA negative    pH, UA 7.0 5.0 - 8.0   Protein, UA negative    Urobilinogen, UA 1.0 0.2 or 1.0 E.U./dL   Nitrite, UA negative    Leukocytes, UA Negative Negative   ..Marland KitchenLab Results  Component Value Date   HGBA1C 6.9 02/14/2017   A!C has trended up but still under control. Restart jardiance.  Has flu shot.  Up to date vaccines and eye exam.   For sinusitis added shot of solumedrol today finish zpak.   Fatigue: depression screen negative, labs look good unclear what is causing fatigue. Suggestive of chronic fatigue syndrome. Started wellbutrin. Discussed side effects. Will write out for 2 weeks then follow up to see how doing.

## 2017-02-14 NOTE — Patient Instructions (Signed)
Solumedrol shot given today.  Start flonase 2 sprays each nostril.  Delsym for cough. Suck on honey/cough drops.

## 2017-02-18 DIAGNOSIS — G9332 Myalgic encephalomyelitis/chronic fatigue syndrome: Secondary | ICD-10-CM | POA: Insufficient documentation

## 2017-02-18 DIAGNOSIS — R5382 Chronic fatigue, unspecified: Secondary | ICD-10-CM | POA: Insufficient documentation

## 2017-02-20 ENCOUNTER — Telehealth: Payer: Self-pay | Admitting: *Deleted

## 2017-02-20 NOTE — Telephone Encounter (Signed)
Called and notified patient forms ready. Patient asks for forms to be mailed. Mailed per request and copy sent to scan

## 2017-02-21 ENCOUNTER — Other Ambulatory Visit: Payer: Self-pay | Admitting: Family

## 2017-06-01 ENCOUNTER — Encounter: Payer: Self-pay | Admitting: Physician Assistant

## 2017-06-01 ENCOUNTER — Ambulatory Visit (INDEPENDENT_AMBULATORY_CARE_PROVIDER_SITE_OTHER): Payer: 59 | Admitting: Physician Assistant

## 2017-06-01 VITALS — BP 126/58 | HR 94 | Ht 64.0 in | Wt 219.0 lb

## 2017-06-01 DIAGNOSIS — L0292 Furuncle, unspecified: Secondary | ICD-10-CM | POA: Diagnosis not present

## 2017-06-01 MED ORDER — SULFAMETHOXAZOLE-TRIMETHOPRIM 800-160 MG PO TABS: 1.0000 | ORAL_TABLET | Freq: Two times a day (BID) | ORAL | 0 refills | Status: DC

## 2017-06-01 MED ORDER — CEFTRIAXONE SODIUM 1 G IJ SOLR
1.0000 g | Freq: Once | INTRAMUSCULAR | Status: AC
  Administered 2017-06-01: 1 g via INTRAMUSCULAR

## 2017-06-01 NOTE — Patient Instructions (Signed)
Skin Abscess A skin abscess is an infected area on or under your skin that contains pus and other material. An abscess can happen almost anywhere on your body. Some abscesses break open (rupture) on their own. Most continue to get worse unless they are treated. The infection can spread deeper into the body and into your blood, which can make you feel sick. Treatment usually involves draining the abscess. Follow these instructions at home: Abscess Care  If you have an abscess that has not drained, place a warm, clean, wet washcloth over the abscess several times a day. Do this as told by your doctor.  Follow instructions from your doctor about how to take care of your abscess. Make sure you: ? Cover the abscess with a bandage (dressing). ? Change your bandage or gauze as told by your doctor. ? Wash your hands with soap and water before you change the bandage or gauze. If you cannot use soap and water, use hand sanitizer.  Check your abscess every day for signs that the infection is getting worse. Check for: ? More redness, swelling, or pain. ? More fluid or blood. ? Warmth. ? More pus or a bad smell. Medicines   Take over-the-counter and prescription medicines only as told by your doctor.  If you were prescribed an antibiotic medicine, take it as told by your doctor. Do not stop taking the antibiotic even if you start to feel better. General instructions  To avoid spreading the infection: ? Do not share personal care items, towels, or hot tubs with others. ? Avoid making skin-to-skin contact with other people.  Keep all follow-up visits as told by your doctor. This is important. Contact a doctor if:  You have more redness, swelling, or pain around your abscess.  You have more fluid or blood coming from your abscess.  Your abscess feels warm when you touch it.  You have more pus or a bad smell coming from your abscess.  You have a fever.  Your muscles ache.  You have  chills.  You feel sick. Get help right away if:  You have very bad (severe) pain.  You see red streaks on your skin spreading away from the abscess. This information is not intended to replace advice given to you by your health care provider. Make sure you discuss any questions you have with your health care provider. Document Released: 11/15/2007 Document Revised: 01/23/2016 Document Reviewed: 04/07/2015 Elsevier Interactive Patient Education  [employer].

## 2017-06-03 ENCOUNTER — Encounter: Payer: Self-pay | Admitting: Physician Assistant

## 2017-06-03 NOTE — Progress Notes (Signed)
   Subjective:    Patient ID: Stacie Cardenas, female    DOB: 1981-01-13, 36 y.o.   MRN: [id number]  HPI Pt is a 36 yo female with Chrons disease and hx of abscess that presents to the clinic today with sore red bump in between breast. She has noticed for last 5 days. Continues to get bigger and more red and increasing in tenderness. No fever, chills, body aches, nausea. She is concerned because she is due for her next remicade injection on 12/28. She cannot be on aBX FOR 7 DAYS prior to injection and she wants to have injection before end of the year due to cost concerns and meeting deductible. She has done some warm compresses.   .. Active Ambulatory Problems    Diagnosis Date Noted  . HYPERLIPIDEMIA 04/27/2010  . Iron deficiency anemia 01/21/2010  . HYPERTENSION, BENIGN ESSENTIAL 04/26/2006  . WEIGHT GAIN 11/13/2008  . MIGRAINE HEADACHE 06/28/2010  . Menorrhagia 07/03/2013  . Crohn's disease ([place]) 03/30/2014  . Type 2 diabetes mellitus, controlled ([place]) 06/08/2014  . Insomnia 06/20/2015  . Absolute anemia 09/07/2015  . Vitamin D deficiency 10/03/2016  . Obesity, Class II, BMI 35-39.9, with comorbidity 10/20/2016  . Chronic fatigue syndrome 02/18/2017   Resolved Ambulatory Problems    Diagnosis Date Noted  . [place] GLAND 02/19/2009  . CARBUNCLE/FURUNCLE NOS 04/26/2006  . SKIN RASH 11/13/2008  . SYMPTOM, FREQUENCY, URINARY 04/26/2006  . Abdominal pain, epigastric 04/27/2010  . Impaired fasting glucose 04/27/2010  . 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 VAGINAL DISCHARGE 08/10/2010  . FOLLICULITIS 62/83/6629  . SORE THROAT 01/10/2011  . ABDOMINAL PAIN 01/10/2011  . Hyperlipidemia 10/14/2012  . Obesity, unspecified 10/14/2012  . Other iron deficiency anemias 03/17/2013  . Elevated hemoglobin A1c 05/11/2014  . Abscess of left axilla 12/31/2014  . Bilateral lower abdominal pain 09/07/2015   Past Medical History:  Diagnosis Date  . Crohn's  disease ([place]) 03/30/2014  . Hypercholesterolemia   . Hypertension   . IBS (irritable bowel syndrome)   . Other specified iron deficiency anemias 03/17/2013  . Type 2 diabetes mellitus, controlled ([place]) 06/08/2014      Review of Systems See HPI.     Objective:   Physical Exam  Constitutional: She is oriented to person, place, and time. She appears well-developed and well-nourished.  HENT:  Head: Normocephalic and atraumatic.  Cardiovascular: Normal rate, regular rhythm and normal heart sounds.  Pulmonary/Chest: Effort normal and breath sounds normal.  Neurological: She is alert and oriented to person, place, and time.  Skin:     Psychiatric: She has a normal mood and affect. Her behavior is normal.          Assessment & Plan:  Marland KitchenMarland KitchenDiagnoses and all orders for this visit:  Boil -     sulfamethoxazole-trimethoprim (BACTRIM DS,SEPTRA DS) 800-160 MG tablet; Take 1 tablet by mouth 2 (two) times daily. For 7 days. -     cefTRIAXone (ROCEPHIN) injection 1 g   Shot of rocephin given today IM. This will allow her to get remicade injection before end of year. Warm compresses 3 times a day. Bactrim given to start if fever, chills, body aches. Offered to I and D today and let it drain. Pt declined and wanted to try this first. Follow up as needed.

## 2017-07-04 ENCOUNTER — Encounter: Payer: Self-pay | Admitting: Physician Assistant

## 2017-07-04 ENCOUNTER — Ambulatory Visit: Payer: 59 | Admitting: Physician Assistant

## 2017-07-04 VITALS — BP 131/75 | HR 75 | Ht 64.0 in | Wt 221.0 lb

## 2017-07-04 DIAGNOSIS — L0292 Furuncle, unspecified: Secondary | ICD-10-CM | POA: Diagnosis not present

## 2017-07-04 DIAGNOSIS — F419 Anxiety disorder, unspecified: Secondary | ICD-10-CM | POA: Diagnosis not present

## 2017-07-04 DIAGNOSIS — M545 Low back pain: Secondary | ICD-10-CM | POA: Diagnosis not present

## 2017-07-04 DIAGNOSIS — R5382 Chronic fatigue, unspecified: Secondary | ICD-10-CM

## 2017-07-04 DIAGNOSIS — R109 Unspecified abdominal pain: Secondary | ICD-10-CM | POA: Diagnosis not present

## 2017-07-04 DIAGNOSIS — G8929 Other chronic pain: Secondary | ICD-10-CM | POA: Diagnosis not present

## 2017-07-04 DIAGNOSIS — G9332 Myalgic encephalomyelitis/chronic fatigue syndrome: Secondary | ICD-10-CM

## 2017-07-04 LAB — POCT URINALYSIS DIPSTICK
Bilirubin, UA: NEGATIVE
Glucose, UA: NEGATIVE
KETONES UA: NEGATIVE
LEUKOCYTES UA: NEGATIVE
Nitrite, UA: NEGATIVE
Protein, UA: NEGATIVE
RBC UA: NEGATIVE
SPEC GRAV UA: 1.015 (ref 1.010–1.025)
Urobilinogen, UA: 0.2 E.U./dL
pH, UA: 6 (ref 5.0–8.0)

## 2017-07-04 MED ORDER — DICLOFENAC SODIUM 1 % TD GEL: 4.0000 g | Freq: Four times a day (QID) | TRANSDERMAL | 2 refills | Status: DC

## 2017-07-04 MED ORDER — ALPRAZOLAM 0.25 MG PO TABS: ORAL_TABLET | ORAL | 2 refills | Status: DC

## 2017-07-04 MED ORDER — CYCLOBENZAPRINE HCL 10 MG PO TABS: 10.0000 mg | ORAL_TABLET | Freq: Every day | ORAL | 1 refills | Status: DC

## 2017-07-04 MED ORDER — BUPROPION HCL ER (XL) 150 MG PO TB24: 150.0000 mg | ORAL_TABLET | Freq: Every day | ORAL | 4 refills | Status: DC

## 2017-07-04 NOTE — Patient Instructions (Addendum)
Tens Unit consider with heat.  Only as needed aleve/ibuprofne.   Low Back Sprain Rehab Ask your health care provider which exercises are safe for you. Do exercises exactly as told by your health care provider and adjust them as directed. It is normal to feel mild stretching, pulling, tightness, or discomfort as you do these exercises, but you should stop right away if you feel sudden pain or your pain gets worse. Do not begin these exercises until told by your health care provider. Stretching and range of motion exercises These exercises warm up your muscles and joints and improve the movement and flexibility of your back. These exercises also help to relieve pain, numbness, and tingling. Exercise A: Lumbar rotation  1. Lie on your back on a firm surface and bend your knees. 2. Straighten your arms out to your sides so each arm forms an "L" shape with a side of your body (a 90 degree angle). 3. Slowly move both of your knees to one side of your body until you feel a stretch in your lower back. Try not to let your shoulders move off of the floor. 4. Hold for __________ seconds. 5. Tense your abdominal muscles and slowly move your knees back to the starting position. 6. Repeat this exercise on the other side of your body. Repeat __________ times. Complete this exercise __________ times a day. Exercise B: Prone extension on elbows  1. Lie on your abdomen on a firm surface. 2. Prop yourself up on your elbows. 3. Use your arms to help lift your chest up until you feel a gentle stretch in your abdomen and your lower back. ? This will place some of your body weight on your elbows. If this is uncomfortable, try stacking pillows under your chest. ? Your hips should stay down, against the surface that you are lying on. Keep your hip and back muscles relaxed. 4. Hold for __________ seconds. 5. Slowly relax your upper body and return to the starting position. Repeat __________ times. Complete this  exercise __________ times a day. Strengthening exercises These exercises build strength and endurance in your back. Endurance is the ability to use your muscles for a long time, even after they get tired. Exercise C: Pelvic tilt 1. Lie on your back on a firm surface. Bend your knees and keep your feet flat. 2. Tense your abdominal muscles. Tip your pelvis up toward the ceiling and flatten your lower back into the floor. ? To help with this exercise, you may place a small towel under your lower back and try to push your back into the towel. 3. Hold for __________ seconds. 4. Let your muscles relax completely before you repeat this exercise. Repeat __________ times. Complete this exercise __________ times a day. Exercise D: Alternating arm and leg raises  1. Get on your hands and knees on a firm surface. If you are on a hard floor, you may want to use padding to cushion your knees, such as an exercise mat. 2. Line up your arms and legs. Your hands should be below your shoulders, and your knees should be below your hips. 3. Lift your left leg behind you. At the same time, raise your right arm and straighten it in front of you. ? Do not lift your leg higher than your hip. ? Do not lift your arm higher than your shoulder. ? Keep your abdominal and back muscles tight. ? Keep your hips facing the ground. ? Do not arch your back. ?  Keep your balance carefully, and do not hold your breath. 4. Hold for __________ seconds. 5. Slowly return to the starting position and repeat with your right leg and your left arm. Repeat __________ times. Complete this exercise __________ times a day. Exercise E: Abdominal set with straight leg raise  1. Lie on your back on a firm surface. 2. Bend one of your knees and keep your other leg straight. 3. Tense your abdominal muscles and lift your straight leg up, 4-6 inches (10-15 cm) off the ground. 4. Keep your abdominal muscles tight and hold for __________  seconds. ? Do not hold your breath. ? Do not arch your back. Keep it flat against the ground. 5. Keep your abdominal muscles tense as you slowly lower your leg back to the starting position. 6. Repeat with your other leg. Repeat __________ times. Complete this exercise __________ times a day. Posture and body mechanics  Body mechanics refers to the movements and positions of your body while you do your daily activities. Posture is part of body mechanics. Good posture and healthy body mechanics can help to relieve stress in your body's tissues and joints. Good posture means that your spine is in its natural S-curve position (your spine is neutral), your shoulders are pulled back slightly, and your head is not tipped forward. The following are general guidelines for applying improved posture and body mechanics to your everyday activities. Standing   When standing, keep your spine neutral and your feet about hip-width apart. Keep a slight bend in your knees. Your ears, shoulders, and hips should line up.  When you do a task in which you stand in one place for a long time, place one foot up on a stable object that is 2-4 inches (5-10 cm) high, such as a footstool. This helps keep your spine neutral. Sitting   When sitting, keep your spine neutral and keep your feet flat on the floor. Use a footrest, if necessary, and keep your thighs parallel to the floor. Avoid rounding your shoulders, and avoid tilting your head forward.  When working at a desk or a computer, keep your desk at a height where your hands are slightly lower than your elbows. Slide your chair under your desk so you are close enough to maintain good posture.  When working at a computer, place your monitor at a height where you are looking straight ahead and you do not have to tilt your head forward or downward to look at the screen. Resting   When lying down and resting, avoid positions that are most painful for you.  If you  have pain with activities such as sitting, bending, stooping, or squatting (flexion-based activities), lie in a position in which your body does not bend very much. For example, avoid curling up on your side with your arms and knees near your chest (fetal position).  If you have pain with activities such as standing for a long time or reaching with your arms (extension-based activities), lie with your spine in a neutral position and bend your knees slightly. Try the following positions:  Lying on your side with a pillow between your knees.  Lying on your back with a pillow under your knees. Lifting   When lifting objects, keep your feet at least shoulder-width apart and tighten your abdominal muscles.  Bend your knees and hips and keep your spine neutral. It is important to lift using the strength of your legs, not your back. Do not lock your  knees straight out.  Always ask for help to lift heavy or awkward objects. This information is not intended to replace advice given to you by your health care provider. Make sure you discuss any questions you have with your health care provider. Document Released: 05/29/2005 Document Revised: 02/03/2016 Document Reviewed: 03/10/2015 Elsevier Interactive Patient Education  [employer].

## 2017-07-04 NOTE — Progress Notes (Signed)
Subjective:    Patient ID: Stacie Cardenas, female    DOB: [birth year], 37 y.o.   MRN: [id number]  HPI  Pt is a 37 yo female with chrons disease and chronic fatigue syndrome who presents to the clinic to follow up on boil.   Boil actually opened and drained this am. It feels a lot better. No fever, chills, body aches. Finished doxycycline 3 days ago.   She is having some left flank and low back pain. Back pain has been present for 6 months. No known injury. Left flank pain just started. No dysuria. She is obese and not doing anything to lose weight at this time. She has not tried anything to make better. Denies any radiation of pain into legs. No saddle anesthesia or bowel or bladder dysfunction. Not able to take NSAIDs due to chrons.   .. Active Ambulatory Problems    Diagnosis Date Noted  . HYPERLIPIDEMIA 04/27/2010  . Iron deficiency anemia 01/21/2010  . HYPERTENSION, BENIGN ESSENTIAL 04/26/2006  . WEIGHT GAIN 11/13/2008  . MIGRAINE HEADACHE 06/28/2010  . Menorrhagia 07/03/2013  . Crohn's disease ([place]) 03/30/2014  . Type 2 diabetes mellitus, controlled ([place]) 06/08/2014  . Insomnia 06/20/2015  . Absolute anemia 09/07/2015  . Vitamin D deficiency 10/03/2016  . Obesity, Class II, BMI 35-39.9, with comorbidity 10/20/2016  . Chronic fatigue syndrome 02/18/2017  . Chronic left-sided low back pain without sciatica 07/08/2017  . Anxiety 07/08/2017   Resolved Ambulatory Problems    Diagnosis Date Noted  . [place] GLAND 02/19/2009  . CARBUNCLE/FURUNCLE NOS 04/26/2006  . SKIN RASH 11/13/2008  . SYMPTOM, FREQUENCY, URINARY 04/26/2006  . Abdominal pain, epigastric 04/27/2010  . Impaired fasting glucose 04/27/2010  . 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 VAGINAL DISCHARGE 08/10/2010  . FOLLICULITIS 96/75/9163  . SORE THROAT 01/10/2011  . ABDOMINAL PAIN 01/10/2011  . Hyperlipidemia 10/14/2012  . Obesity, unspecified 10/14/2012  . Other iron  deficiency anemias 03/17/2013  . Elevated hemoglobin A1c 05/11/2014  . Abscess of left axilla 12/31/2014  . Bilateral lower abdominal pain 09/07/2015   Past Medical History:  Diagnosis Date  . Crohn's disease ([address]) 03/30/2014  . Hypercholesterolemia   . Hypertension   . IBS (irritable bowel syndrome)   . Other specified iron deficiency anemias 03/17/2013  . Type 2 diabetes mellitus, controlled ([place]) 06/08/2014     Review of Systems  All other systems reviewed and are negative.      Objective:   Physical Exam  Constitutional: She is oriented to person, place, and time. She appears well-developed and well-nourished.  HENT:  Head: Normocephalic and atraumatic.  Neck: Normal range of motion. Neck supple.  Cardiovascular: Normal rate, regular rhythm and normal heart sounds.  Pulmonary/Chest: Effort normal and breath sounds normal.  Musculoskeletal:  No tenderness to palpation over lumbar spine to palpation.  Tight Paraspinous muscles bilaterally.   Neurological: She is alert and oriented to person, place, and time.  Skin:     Psychiatric: She has a normal mood and affect. Her behavior is normal.          Assessment & Plan:  Marland KitchenMarland KitchenDiagnoses and all orders for this visit:  Chronic left-sided low back pain without sciatica -     diclofenac sodium (VOLTAREN) 1 % GEL; Apply 4 g topically 4 (four) times daily. To affected joint. -     cyclobenzaprine (FLEXERIL) 10 MG tablet; Take 1 tablet (10 mg total) by mouth at bedtime.  Left flank  pain -     POCT urinalysis dipstick  Chronic fatigue syndrome -     buPROPion (WELLBUTRIN XL) 150 MG 24 hr tablet; Take 1 tablet (150 mg total) by mouth daily.  Boil  Anxiety -     ALPRAZolam (XANAX) 0.25 MG tablet; Once daily as needed for acute anxiety.    .. Results for orders placed or performed in visit on 07/04/17  POCT urinalysis dipstick  Result Value Ref Range   Color, UA yellow    Clarity, UA slightly cloudy    Glucose, UA  neg    Bilirubin, UA neg    Ketones, UA neg    Spec Grav, UA 1.015 1.010 - 1.025   Blood, UA neg    pH, UA 6.0 5.0 - 8.0   Protein, UA neg    Urobilinogen, UA 0.2 0.2 or 1.0 E.U./dL   Nitrite, UA neg    Leukocytes, UA Negative Negative   Appearance     Odor     No signs of infection today. Will culture.   Boil opened and drained this morning. Continue warm compresses. Follow up as needed.   I suspect back pain is more muscular. Discussed low back stretches, voltaren gel, tens units. Consider PT. We could get xrays. Pt declined today. Follow up as needed.

## 2017-07-05 ENCOUNTER — Other Ambulatory Visit: Payer: Self-pay | Admitting: *Deleted

## 2017-07-05 DIAGNOSIS — I10 Essential (primary) hypertension: Secondary | ICD-10-CM

## 2017-07-05 MED ORDER — TELMISARTAN-HCTZ 80-12.5 MG PO TABS: 1.0000 | ORAL_TABLET | Freq: Every day | ORAL | 1 refills | Status: DC

## 2017-07-05 MED ORDER — TRAZODONE HCL 50 MG PO TABS: 25.0000 mg | ORAL_TABLET | Freq: Every evening | ORAL | 1 refills | Status: DC | PRN

## 2017-07-08 ENCOUNTER — Encounter: Payer: Self-pay | Admitting: Physician Assistant

## 2017-07-08 DIAGNOSIS — M545 Low back pain, unspecified: Secondary | ICD-10-CM | POA: Insufficient documentation

## 2017-07-08 DIAGNOSIS — F419 Anxiety disorder, unspecified: Secondary | ICD-10-CM | POA: Insufficient documentation

## 2017-07-08 DIAGNOSIS — G8929 Other chronic pain: Secondary | ICD-10-CM | POA: Insufficient documentation

## 2017-07-08 MED ORDER — CLINDAMYCIN PHOSPHATE 1 % EX GEL: Freq: Two times a day (BID) | CUTANEOUS | 0 refills | Status: DC

## 2017-08-26 ENCOUNTER — Encounter: Payer: Self-pay | Admitting: Emergency Medicine

## 2017-08-26 ENCOUNTER — Emergency Department (INDEPENDENT_AMBULATORY_CARE_PROVIDER_SITE_OTHER)
Admission: EM | Admit: 2017-08-26 | Discharge: 2017-08-26 | Disposition: A | Discharge status: Home / Self Care | Payer: 59 | Source: Home / Self Care | Attending: Family Medicine | Admitting: Family Medicine

## 2017-08-26 DIAGNOSIS — J069 Acute upper respiratory infection, unspecified: Secondary | ICD-10-CM | POA: Diagnosis not present

## 2017-08-26 DIAGNOSIS — B9789 Other viral agents as the cause of diseases classified elsewhere: Secondary | ICD-10-CM

## 2017-08-26 DIAGNOSIS — J020 Streptococcal pharyngitis: Secondary | ICD-10-CM | POA: Diagnosis not present

## 2017-08-26 LAB — POCT RAPID STREP A (OFFICE): Rapid Strep A Screen: POSITIVE — AB

## 2017-08-26 MED ORDER — GUAIFENESIN-CODEINE 100-10 MG/5ML PO SOLN: ORAL | 0 refills | Status: DC

## 2017-08-26 MED ORDER — PENICILLIN V POTASSIUM 500 MG PO TABS: ORAL_TABLET | ORAL | 0 refills | Status: DC

## 2017-08-26 NOTE — ED Triage Notes (Signed)
Patient c/o sore throat x 2 days, blowing bloody mucous out of nose, non-productive cough.

## 2017-08-26 NOTE — ED Provider Notes (Signed)
[facility]    CSN: [id number] Arrival date & time: 08/26/17  1143     History   Chief Complaint Chief Complaint  Patient presents with  . Sore Throat    HPI Stacie Cardenas is a 37 y.o. female.   Three days ago patient developed sore throat and cough.  The sinus congestion, fatigue, and sore throat have increased.  She denies pleuritic pain or shortness of breath.  Her cough and sore throat are worse at night.   The history is provided by the patient.    Past Medical History:  Diagnosis Date  . Crohn's disease ([place]) 03/30/2014   Biopsies done 03/23/14.  Digestive health management.    . Hypercholesterolemia   . Hypertension   . IBS (irritable bowel syndrome)   . Other specified iron deficiency anemias 03/17/2013  . Type 2 diabetes mellitus, controlled ([place]) 06/08/2014    Patient Active Problem List   Diagnosis Date Noted  . Chronic left-sided low back pain without sciatica 07/08/2017  . Anxiety 07/08/2017  . Chronic fatigue syndrome 02/18/2017  . Obesity, Class II, BMI 35-39.9, with comorbidity 10/20/2016  . Vitamin D deficiency 10/03/2016  . Absolute anemia 09/07/2015  . Insomnia 06/20/2015  . Type 2 diabetes mellitus, controlled ([place]) 06/08/2014  . Crohn's disease ([place]) 03/30/2014  . Menorrhagia 07/03/2013  . MIGRAINE HEADACHE 06/28/2010  . HYPERLIPIDEMIA 04/27/2010  . Iron deficiency anemia 01/21/2010  . WEIGHT GAIN 11/13/2008  . HYPERTENSION, BENIGN ESSENTIAL 04/26/2006    Past Surgical History:  Procedure Laterality Date  . CESAREAN SECTION      OB History    Gravida Para Term Preterm AB Living   _0 0 0 3   SAB TAB Ectopic Multiple Live Births   0 0 0 0         Home Medications    Prior to Admission medications   Medication Sig Start Date End Date Taking? Authorizing Provider  ALPRAZolam Duanne Moron) 0.25 MG tablet Once daily as needed for acute anxiety. 07/04/17   [name], PA-C  AMBULATORY NON FORMULARY MEDICATION [place]  CONTOUR NEXT TESTING STRIPS AND LANCETS 06/08/16   Hali Marry, MD  azaTHIOprine (IMURAN) 50 MG tablet Take 1 tablet (50 mg total) by mouth daily. 06/09/14   [name], PA-C  BAYER CONTOUR NEXT TEST test strip CHECK BLOOD SAGUAR ONCE A DAY 05/26/16   Breeback, Jade L, PA-C  Blood Glucose Monitoring Suppl (BAYER CONTOUR NEXT MONITOR) w/Device KIT CHECK BLOOD SUGAR ONCE A DAY 05/26/16   Breeback, Jade L, PA-C  buPROPion (WELLBUTRIN XL) 150 MG 24 hr tablet Take 1 tablet (150 mg total) by mouth daily. 07/04/17   Breeback, Jade L, PA-C  clindamycin (CLINDAGEL) 1 % gel Apply topically 2 (two) times daily. 07/08/17   Breeback, Jade L, PA-C  cyclobenzaprine (FLEXERIL) 10 MG tablet Take 1 tablet (10 mg total) by mouth at bedtime. 07/04/17   Breeback, Jade L, PA-C  diclofenac sodium (VOLTAREN) 1 % GEL Apply 4 g topically 4 (four) times daily. To affected joint. 07/04/17   Breeback, Jade L, PA-C  dicyclomine (BENTYL) 20 MG tablet Take 20 mg by mouth 3 (three) times daily. 10/05/15   [provider]  empagliflozin (JARDIANCE) 10 MG TABS tablet Take 10 mg by mouth daily. 10/18/16   Breeback, Jade L, PA-C  guaiFENesin-codeine 100-10 MG/5ML syrup Take 19m by mouth at bedtime as needed for cough.  May repeat dose in 4 to 6 hours. 08/26/17  Kandra Nicolas, MD  inFLIXimab (REMICADE) 100 MG injection Inject into the vein every 8 (eight) weeks.    [provider]  penicillin v potassium (VEETID) 500 MG tablet Take one tab by mouth twice daily for 10 days 08/26/17   Kandra Nicolas, MD  simvastatin (ZOCOR) 40 MG tablet Take 1 tablet (40 mg total) by mouth every evening. 05/19/16   Breeback, Royetta Car, PA-C  SUMAtriptan (IMITREX) 100 MG tablet Take 100 mg by mouth as needed for migraine. 02/07/13   Breeback, Royetta Car, PA-C  telmisartan-hydrochlorothiazide (MICARDIS HCT) 80-12.5 MG tablet Take 1 tablet by mouth daily. 07/05/17   Breeback, Royetta Car, PA-C  traZODone (DESYREL) 50 MG tablet Take 0.5-1  tablets (25-50 mg total) by mouth at bedtime as needed for sleep. 07/05/17   [name], PA-C    Family History No family history on file.  Social History Social History   Tobacco Use  . Smoking status: Never Smoker  . Smokeless tobacco: Never Used  . Tobacco comment: never used tobacco  Substance Use Topics  . Alcohol use: No    Alcohol/week: 0.0 oz  . Drug use: No     Allergies   Ampicillin and Xigduo xr [dapagliflozin-metformin hcl er]   Review of Systems Review of Systems + sore throat + cough No pleuritic pain No wheezing + nasal congestion + post-nasal drainage No sinus pain/pressure No itchy/red eyes No earache No hemoptysis No SOB ? fever, + chills No nausea No vomiting No abdominal pain No diarrhea No urinary symptoms No skin rash + fatigue No myalgias No headache Used OTC meds without relief   Physical Exam Triage Vital Signs ED Triage Vitals  Enc Vitals Group     BP 08/26/17 1207 116/78     Pulse Rate 08/26/17 1207 79     Resp --      Temp 08/26/17 1207 98.8 F (37.1 C)     Temp Source 08/26/17 1207 Oral     SpO2 08/26/17 1207 98 %     Weight 08/26/17 1208 216 lb (98 kg)     Height 08/26/17 1208 _0  (1.626 m)     Head Circumference --      Peak Flow --      Pain Score 08/26/17 1207 4     Pain Loc --      Pain Edu? --      Excl. in [place]? --    No data found.  Updated Vital Signs BP 116/78 (BP Location: Right Arm)   Pulse 79   Temp 98.8 F (37.1 C) (Oral)   Ht _1  (1.626 m)   Wt 216 lb (98 kg)   LMP 08/11/2017 (Exact Date)   SpO2 98%   BMI 37.08 kg/m   Visual Acuity Right Eye Distance:   Left Eye Distance:   Bilateral Distance:    Right Eye Near:   Left Eye Near:    Bilateral Near:     Physical Exam Nursing notes and Vital Signs reviewed. Appearance:  Patient appears stated age, and in no acute distress Eyes:  Pupils are equal, round, and reactive to light and accomodation.  Extraocular movement is intact.   Conjunctivae are not inflamed  Ears:  Canals normal.  Tympanic membranes normal.  Nose:  Mildly congested turbinates.  No sinus tenderness.   Pharynx:   Erythematous tonsils without exudate. Neck:  Supple.  Enlarged posterior/lateral nodes are palpated bilaterally, tender to palpation on the left.  Tonsillar nodes  also tender. Lungs:  Clear to auscultation.  Breath sounds are equal.  Moving air well. Heart:  Regular rate and rhythm without murmurs, rubs, or gallops.  Abdomen:  Nontender without masses or hepatosplenomegaly.  Bowel sounds are present.  No CVA or flank tenderness.  Extremities:  No edema.  Skin:  No rash present.    UC Treatments / Results  Labs (all labs ordered are listed, but only abnormal results are displayed) Labs Reviewed  POCT RAPID STREP A (OFFICE) - Abnormal; Notable for the following components:      Result Value   Rapid Strep A Screen Positive (*)    All other components within normal limits    EKG  EKG Interpretation None       Radiology No results found.  Procedures Procedures (including critical care time)  Medications Ordered in UC Medications - No data to display   Initial Impression / Assessment and Plan / UC Course  I have reviewed the triage vital signs and the nursing notes.  Pertinent labs & imaging results that were available during my care of the patient were reviewed by me and considered in my medical decision making (see chart for details).    Begin PenVK. Rx for Robitussin AC for night time cough.  Controlled Substance Prescriptions I have consulted the [place] Controlled Substances Registry for this patient, and feel the risk/benefit ratio today is favorable for proceeding with this prescription for a controlled substance.   Take plain guaifenesin (1268m extended release tabs such as Mucinex) twice daily, with plenty of water, for cough and congestion.  May add Pseudoephedrine (321m one or two every 4 to 6 hours) for sinus  congestion.  Get adequate rest.   May use Afrin nasal spray (or generic oxymetazoline) each morning for about 5 days and then discontinue.  Also recommend using saline nasal spray several times daily and saline nasal irrigation (AYR is a common brand).  Use Flonase nasal spray each morning after using Afrin nasal spray and saline nasal irrigation. Try warm salt water gargles for sore throat.  Stop all antihistamines for now, and other non-prescription cough/cold preparations. May take Ibuprofen 20075m4 tabs every 8 hours with food for sore throat, headache, etc. Followup with Family Doctor if not improved in 10 days.    Final Clinical Impressions(s) / UC Diagnoses   Final diagnoses:  Strep pharyngitis  Viral URI with cough    ED Discharge Orders        Ordered    penicillin v potassium (VEETID) 500 MG tablet     08/26/17 1303    guaiFENesin-codeine 100-10 MG/5ML syrup     08/26/17 1303           BeeKandra NicolasD 08/27/17 1416

## 2017-08-26 NOTE — Discharge Instructions (Signed)
Take plain guaifenesin (1236m extended release tabs such as Mucinex) twice daily, with plenty of water, for cough and congestion.  May add Pseudoephedrine (370m one or two every 4 to 6 hours) for sinus congestion.  Get adequate rest.   May use Afrin nasal spray (or generic oxymetazoline) each morning for about 5 days and then discontinue.  Also recommend using saline nasal spray several times daily and saline nasal irrigation (AYR is a common brand).  Use Flonase nasal spray each morning after using Afrin nasal spray and saline nasal irrigation. Try warm salt water gargles for sore throat.  Stop all antihistamines for now, and other non-prescription cough/cold preparations. May take Ibuprofen 20067m4 tabs every 8 hours with food for sore throat, headache, etc.

## 2017-08-28 ENCOUNTER — Telehealth: Payer: Self-pay | Admitting: Physician Assistant

## 2017-08-28 NOTE — Telephone Encounter (Signed)
Pt was seen in UC for strep throat. They put her on oral penicillin. She took the first dose on 08/26/17, two doses yesterday, and one dose this morning so far. Pt reports she has been gargling warm salt water, using the cough syrup prescribed, and taking ibuprofen. States her throat is still hurting very badly, not any better. Denies any fever. Questions what else she can do. Will route.

## 2017-08-28 NOTE — Telephone Encounter (Signed)
She can come in for shot that seems to work faster. Is she having problems breathing? Drooling? We could also give her a shot of toradol for pain.

## 2017-08-29 ENCOUNTER — Encounter: Payer: Self-pay | Admitting: Physician Assistant

## 2017-08-29 ENCOUNTER — Ambulatory Visit (INDEPENDENT_AMBULATORY_CARE_PROVIDER_SITE_OTHER): Payer: 59 | Admitting: Physician Assistant

## 2017-08-29 VITALS — BP 128/83 | HR 99 | Ht 64.0 in | Wt 214.0 lb

## 2017-08-29 DIAGNOSIS — H7291 Unspecified perforation of tympanic membrane, right ear: Secondary | ICD-10-CM

## 2017-08-29 DIAGNOSIS — J020 Streptococcal pharyngitis: Secondary | ICD-10-CM | POA: Diagnosis not present

## 2017-08-29 MED ORDER — PENICILLIN G BENZATHINE 1200000 UNIT/2ML IM SUSP
1.2000 10*6.[IU] | Freq: Once | INTRAMUSCULAR | Status: AC
  Administered 2017-08-29: 1.2 10*6.[IU] via INTRAMUSCULAR

## 2017-08-29 MED ORDER — KETOROLAC TROMETHAMINE 30 MG/ML IJ SOLN
30.0000 mg | Freq: Once | INTRAMUSCULAR | Status: AC
  Administered 2017-08-29: 30 mg via INTRAMUSCULAR

## 2017-08-29 NOTE — Patient Instructions (Signed)
Strep Throat Strep throat is a bacterial infection of the throat. Your health care provider may call the infection tonsillitis or pharyngitis, depending on whether there is swelling in the tonsils or at the back of the throat. Strep throat is most common during the cold months of the year in children who are 5-37 years of age, but it can happen during any season in people of any age. This infection is spread from person to person (contagious) through coughing, sneezing, or close contact. What are the causes? Strep throat is caused by the bacteria called Streptococcus pyogenes. What increases the risk? This condition is more likely to develop in:  People who spend time in crowded places where the infection can spread easily.  People who have close contact with someone who has strep throat.  What are the signs or symptoms? Symptoms of this condition include:  Fever or chills.  Redness, swelling, or pain in the tonsils or throat.  Pain or difficulty when swallowing.  White or yellow spots on the tonsils or throat.  Swollen, tender glands in the neck or under the jaw.  Red rash all over the body (rare).  How is this diagnosed? This condition is diagnosed by performing a rapid strep test or by taking a swab of your throat (throat culture test). Results from a rapid strep test are usually ready in a few minutes, but throat culture test results are available after one or two days. How is this treated? This condition is treated with antibiotic medicine. Follow these instructions at home: Medicines  Take over-the-counter and prescription medicines only as told by your health care provider.  Take your antibiotic as told by your health care provider. Do not stop taking the antibiotic even if you start to feel better.  Have family members who also have a sore throat or fever tested for strep throat. They may need antibiotics if they have the strep infection. Eating and drinking  Do not  share food, drinking cups, or personal items that could cause the infection to spread to other people.  If swallowing is difficult, try eating soft foods until your sore throat feels better.  Drink enough fluid to keep your urine clear or pale yellow. General instructions  Gargle with a salt-water mixture 3-4 times per day or as needed. To make a salt-water mixture, completely dissolve -1 tsp of salt in 1 cup of warm water.  Make sure that all household members wash their hands well.  Get plenty of rest.  Stay home from school or work until you have been taking antibiotics for 24 hours.  Keep all follow-up visits as told by your health care provider. This is important. Contact a health care provider if:  The glands in your neck continue to get bigger.  You develop a rash, cough, or earache.  You cough up a thick liquid that is green, yellow-brown, or bloody.  You have pain or discomfort that does not get better with medicine.  Your problems seem to be getting worse rather than better.  You have a fever. Get help right away if:  You have new symptoms, such as vomiting, severe headache, stiff or painful neck, chest pain, or shortness of breath.  You have severe throat pain, drooling, or changes in your voice.  You have swelling of the neck, or the skin on the neck becomes red and tender.  You have signs of dehydration, such as fatigue, dry mouth, and decreased urination.  You become increasingly sleepy, or   you cannot wake up completely.  Your joints become red or painful. This information is not intended to replace advice given to you by your health care provider. Make sure you discuss any questions you have with your health care provider. Document Released: 05/26/2000 Document Revised: 01/26/2016 Document Reviewed: 09/21/2014 Elsevier Interactive Patient Education  2018 Elsevier Inc.  

## 2017-08-29 NOTE — Progress Notes (Signed)
Subjective:    Patient ID: Stacie Cardenas, female    DOB: 1981-04-09, 37 y.o.   MRN: [id number]  HPI Stacie Cardenas is a 37 year old female who presents today with a sore throat, congestion, and bilateral ear pain for the past 6 days. She was seen at urgent care 2 days ago and prescribed penicillin for confirmed strep pharyngitis. She requested a shot instead of oral because she has poor absorption due to Crohn's. She has continued to have pain and congestion since starting the antibiotics. She denies any fevers, chills, body aches, shortness of breath, drooling, or trouble swallowing.   .. Active Ambulatory Problems    Diagnosis Date Noted  . HYPERLIPIDEMIA 04/27/2010  . Iron deficiency anemia 01/21/2010  . HYPERTENSION, BENIGN ESSENTIAL 04/26/2006  . WEIGHT GAIN 11/13/2008  . MIGRAINE HEADACHE 06/28/2010  . Menorrhagia 07/03/2013  . Crohn's disease ([place]) 03/30/2014  . Type 2 diabetes mellitus, controlled ([place]) 06/08/2014  . Insomnia 06/20/2015  . Absolute anemia 09/07/2015  . Vitamin D deficiency 10/03/2016  . Obesity, Class II, BMI 35-39.9, with comorbidity 10/20/2016  . Chronic fatigue syndrome 02/18/2017  . Chronic left-sided low back pain without sciatica 07/08/2017  . Anxiety 07/08/2017  . Ruptured ear drum, right 08/29/2017   Resolved Ambulatory Problems    Diagnosis Date Noted  . [place] GLAND 02/19/2009  . CARBUNCLE/FURUNCLE NOS 04/26/2006  . SKIN RASH 11/13/2008  . SYMPTOM, FREQUENCY, URINARY 04/26/2006  . Abdominal pain, epigastric 04/27/2010  . Impaired fasting glucose 04/27/2010  . 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 VAGINAL DISCHARGE 08/10/2010  . FOLLICULITIS 95/28/4132  . SORE THROAT 01/10/2011  . ABDOMINAL PAIN 01/10/2011  . Hyperlipidemia 10/14/2012  . Obesity, unspecified 10/14/2012  . Other iron deficiency anemias 03/17/2013  . Elevated hemoglobin A1c 05/11/2014  . Abscess of left axilla 12/31/2014  . Bilateral lower  abdominal pain 09/07/2015   Past Medical History:  Diagnosis Date  . Crohn's disease ([place]) 03/30/2014  . Hypercholesterolemia   . Hypertension   . IBS (irritable bowel syndrome)   . Other specified iron deficiency anemias 03/17/2013  . Type 2 diabetes mellitus, controlled ([place]) 06/08/2014     Review of Systems  Constitutional: Negative for appetite change, chills, fatigue and fever.  HENT: Positive for ear discharge, ear pain, sinus pressure and sore throat. Negative for drooling, trouble swallowing and voice change.   Eyes: Negative for pain.  Respiratory: Negative for chest tightness and shortness of breath.   Cardiovascular: Negative for chest pain.  Gastrointestinal: Negative for nausea and vomiting.  Musculoskeletal: Negative for myalgias.       Objective:   Physical Exam  Constitutional: She is oriented to person, place, and time. She appears well-developed and well-nourished.  HENT:  Head: Normocephalic and atraumatic.  Left Ear: Tympanic membrane, external ear and ear canal normal.  Mouth/Throat: Posterior oropharyngeal erythema present. No oropharyngeal exudate or posterior oropharyngeal edema.  Right TM rupture at the 1 o'clock position  Eyes: Conjunctivae are normal.  Neck: Normal range of motion.  Cardiovascular: Normal rate, regular rhythm and normal heart sounds.  Pulmonary/Chest: Effort normal and breath sounds normal.  Lymphadenopathy:    She has cervical adenopathy.  Neurological: She is alert and oriented to person, place, and time.  Skin: Skin is warm and dry.  Psychiatric: She has a normal mood and affect. Her behavior is normal.          Assessment & Plan:  Marland KitchenMarland KitchenDiagnoses and all orders for this  visit:  Strep throat -     penicillin g benzathine (BICILLIN LA) 1200000 UNIT/2ML injection 1.2 Million Units -     ketorolac (TORADOL) 30 MG/ML injection 30 mg  Ruptured ear drum, right   Likely patient is not absorpting oral abx as well as IM  infection. Given PCN injection IM today. Toradol 57m IM given for pain control. Continue to rest, hydrate. HO for other symptomatic care given. Follow up as needed.   Discussed ruptured ear drum and how most heal on there on. Follow up in 2 weeks for recheck.

## 2017-08-29 NOTE — Telephone Encounter (Signed)
Pt has been scheduled for today

## 2017-09-05 ENCOUNTER — Ambulatory Visit (INDEPENDENT_AMBULATORY_CARE_PROVIDER_SITE_OTHER): Payer: 59 | Admitting: Physician Assistant

## 2017-09-05 ENCOUNTER — Encounter: Payer: Self-pay | Admitting: Physician Assistant

## 2017-09-05 ENCOUNTER — Ambulatory Visit (INDEPENDENT_AMBULATORY_CARE_PROVIDER_SITE_OTHER): Payer: 59

## 2017-09-05 VITALS — BP 132/74 | HR 83 | Temp 98.4°F

## 2017-09-05 DIAGNOSIS — R221 Localized swelling, mass and lump, neck: Secondary | ICD-10-CM

## 2017-09-05 DIAGNOSIS — J020 Streptococcal pharyngitis: Secondary | ICD-10-CM

## 2017-09-05 DIAGNOSIS — J353 Hypertrophy of tonsils with hypertrophy of adenoids: Secondary | ICD-10-CM

## 2017-09-05 MED ORDER — PREDNISONE 50 MG PO TABS: ORAL_TABLET | ORAL | 0 refills | Status: DC

## 2017-09-05 NOTE — Progress Notes (Signed)
Pt called

## 2017-09-05 NOTE — Addendum Note (Signed)
Addended by: [name] on: 09/05/2017 12:30 PM   Modules accepted: Orders

## 2017-09-05 NOTE — Progress Notes (Addendum)
Subjective:    Patient ID: Stacie Cardenas, female    DOB: 31-Jul-1980, 37 y.o.   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 She was diagnosed with strep on 3/17 and was on a couple days of oral penicillin followed by penicillin injection. She had the injection on 3/20 and ST has improved but right sided neck pain/ear pain/facial pain not improved and worsening. She states she has been able to eat and drink, she is not drooling, and denies any fevers. No voice changes.   .. Active Ambulatory Problems    Diagnosis Date Noted  . HYPERLIPIDEMIA 04/27/2010  . Iron deficiency anemia 01/21/2010  . HYPERTENSION, BENIGN ESSENTIAL 04/26/2006  . WEIGHT GAIN 11/13/2008  . MIGRAINE HEADACHE 06/28/2010  . Menorrhagia 07/03/2013  . Crohn's disease ([place]) 03/30/2014  . Type 2 diabetes mellitus, controlled ([place]) 06/08/2014  . Insomnia 06/20/2015  . Absolute anemia 09/07/2015  . Vitamin D deficiency 10/03/2016  . Obesity, Class II, BMI 35-39.9, with comorbidity 10/20/2016  . Chronic fatigue syndrome 02/18/2017  . Chronic left-sided low back pain without sciatica 07/08/2017  . Anxiety 07/08/2017  . Ruptured ear drum, right 08/29/2017   Resolved Ambulatory Problems    Diagnosis Date Noted  . [place] GLAND 02/19/2009  . CARBUNCLE/FURUNCLE NOS 04/26/2006  . SKIN RASH 11/13/2008  . SYMPTOM, FREQUENCY, URINARY 04/26/2006  . Abdominal pain, epigastric 04/27/2010  . Impaired fasting glucose 04/27/2010  . 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 VAGINAL DISCHARGE 08/10/2010  . FOLLICULITIS 18/84/1660  . SORE THROAT 01/10/2011  . ABDOMINAL PAIN 01/10/2011  . Hyperlipidemia 10/14/2012  . Obesity, unspecified 10/14/2012  . Other iron deficiency anemias 03/17/2013  . Elevated hemoglobin A1c 05/11/2014  . Abscess of left axilla 12/31/2014  . Bilateral  lower abdominal pain 09/07/2015   Past Medical History:  Diagnosis Date  . Crohn's disease ([place]) 03/30/2014  . Hypercholesterolemia   . Hypertension   . IBS (irritable bowel syndrome)   . Other specified iron deficiency anemias 03/17/2013  . Type 2 diabetes mellitus, controlled ([place]) 06/08/2014      Review of Systems  Constitutional: Negative for chills and fever.  HENT: Positive for ear pain, sinus pressure, sinus pain and sore throat. Negative for drooling, ear discharge and trouble swallowing.   Respiratory: Negative for cough, choking and shortness of breath.   Gastrointestinal: Negative for nausea and vomiting.       Objective:   Physical Exam  Constitutional: She is oriented to person, place, and time. She appears well-developed and well-nourished. No distress.  HENT:  Head: Normocephalic and atraumatic.  Right Ear: External ear normal.  Left Ear: External ear normal.  Mouth/Throat: Oropharyngeal exudate ( white debris noted) and posterior oropharyngeal erythema present. No tonsillar abscesses.  Swollen and tender over right parotid gland.   Eyes: Conjunctivae are normal.  Neck: Normal range of motion. Neck supple.  Tender adenopathy right side only.   Cardiovascular: Normal rate and regular rhythm.  Pulmonary/Chest: Effort normal and breath sounds normal.  Lymphadenopathy:    She has cervical adenopathy.  Neurological: She is alert and oriented to person, place, and time.  Psychiatric: She has a normal mood and affect. Her behavior is normal.  Nursing note and vitals reviewed.         Assessment & Plan:  Marland KitchenMarland KitchenDiagnoses and all orders for this visit:  Strep throat -  CT Soft Tissue Neck W Contrast  Enlarged tonsils and adenoids -     CT Soft Tissue Neck W Contrast  Localized swelling, mass or lump of neck -     CT Soft Tissue Neck W Contrast   Concerned for peritonsillar abscess. Tonsils are swollen more right than left. She is 1 week post strep  infection treated with PCN. If CT negative like some parotitis and post strep lymphadenopathy.   CT results: Nonspecific mucosal thickening in the pharynx and hypopharynx consistent with ordinary pharyngitis. No evidence of tonsillar or peritonsillar abscess or other deep space infection. No adenopathy.  Called patient with results. She has been treated for strep will add prednisone for 5 days. Continue ibuprofen and tylenol for pain. Follow up with call on Friday.

## 2017-09-21 ENCOUNTER — Telehealth: Payer: Self-pay | Admitting: *Deleted

## 2017-09-21 NOTE — Telephone Encounter (Signed)
Patient c/o not feeling well. She thinks her iron levels might be low. She'd like to have her iron checked at a [facility] near her place of employment. She provided fax number [phone].  Orders for labs faxed to number above. Will follow up with patient when results are obtained.

## 2017-09-24 ENCOUNTER — Telehealth: Payer: Self-pay | Admitting: *Deleted

## 2017-09-24 NOTE — Telephone Encounter (Signed)
Received labs from [facility]. All iron levels WNL. Patient aware of results.

## 2017-10-03 ENCOUNTER — Other Ambulatory Visit: Payer: Self-pay | Admitting: *Deleted

## 2017-10-15 ENCOUNTER — Encounter: Payer: Self-pay | Admitting: Physician Assistant

## 2017-10-15 ENCOUNTER — Ambulatory Visit (INDEPENDENT_AMBULATORY_CARE_PROVIDER_SITE_OTHER): Payer: 59 | Admitting: Physician Assistant

## 2017-10-15 VITALS — BP 124/86 | HR 80 | Temp 98.1°F | Wt 214.0 lb

## 2017-10-15 DIAGNOSIS — J069 Acute upper respiratory infection, unspecified: Secondary | ICD-10-CM | POA: Diagnosis not present

## 2017-10-15 MED ORDER — PSEUDOEPHEDRINE HCL ER 240 MG PO TB24: 1.0000 | ORAL_TABLET | Freq: Every day | ORAL | 0 refills | Status: DC | PRN

## 2017-10-15 MED ORDER — CHLORPHENIRAMINE-DM 4-30 MG PO TABS: 1.0000 | ORAL_TABLET | Freq: Every evening | ORAL | 0 refills | Status: DC | PRN

## 2017-10-15 MED ORDER — IPRATROPIUM BROMIDE 0.06 % NA SOLN: 2.0000 | Freq: Four times a day (QID) | NASAL | 0 refills | Status: DC | PRN

## 2017-10-15 NOTE — Progress Notes (Signed)
HPI:                                                                Stacie Cardenas is a 37 y.o. female who presents to [facility] today for URI symptoms  URI   This is a new problem. The current episode started in the past 7 days. The problem has been unchanged. There has been no fever. Associated symptoms include congestion, rhinorrhea and sinus pain. Pertinent negatives include no abdominal pain, chest pain, coughing, nausea, neck pain, sore throat or vomiting. She has tried nothing for the symptoms. The treatment provided no relief.      Depression screen [facility] 2/9 09/24/2013 06/23/2013  Decreased Interest 0 0  Down, Depressed, Hopeless 0 0  PHQ - 2 Score 0 0    No flowsheet data found.    Past Medical History:  Diagnosis Date  . Crohn's disease ([place]) 03/30/2014   Biopsies done 03/23/14.  Digestive health management.    . Hypercholesterolemia   . Hypertension   . IBS (irritable bowel syndrome)   . Other specified iron deficiency anemias 03/17/2013  . Type 2 diabetes mellitus, controlled ([place]) 06/08/2014   Past Surgical History:  Procedure Laterality Date  . CESAREAN SECTION     Social History   Tobacco Use  . Smoking status: Never Smoker  . Smokeless tobacco: Never Used  . Tobacco comment: never used tobacco  Substance Use Topics  . Alcohol use: No    Alcohol/week: 0.0 oz   family history is not on file.    ROS: negative except as noted in the HPI  Medications: Current Outpatient Medications  Medication Sig Dispense Refill  . ALPRAZolam (XANAX) 0.25 MG tablet Once daily as needed for acute anxiety. 30 tablet 2  . AMBULATORY NON FORMULARY MEDICATION BAYER CONTOUR NEXT TESTING STRIPS AND LANCETS 100 each 1  . azaTHIOprine (IMURAN) 50 MG tablet Take 1 tablet (50 mg total) by mouth daily.    Marland Kitchen BAYER CONTOUR NEXT TEST test strip CHECK BLOOD SAGUAR ONCE A DAY 100 each 11  . Blood Glucose Monitoring Suppl (BAYER  CONTOUR NEXT MONITOR) w/Device KIT CHECK BLOOD SUGAR ONCE A DAY 1 kit 0  . buPROPion (WELLBUTRIN XL) 150 MG 24 hr tablet Take 1 tablet (150 mg total) by mouth daily. 90 tablet 4  . Chlorpheniramine-DM 4-30 MG TABS Take 1 tablet by mouth at bedtime as needed. 14 each 0  . clindamycin (CLINDAGEL) 1 % gel Apply topically 2 (two) times daily. 30 g 0  . cyclobenzaprine (FLEXERIL) 10 MG tablet Take 1 tablet (10 mg total) by mouth at bedtime. 30 tablet 1  . diclofenac sodium (VOLTAREN) 1 % GEL Apply 4 g topically 4 (four) times daily. To affected joint. 100 g 2  . dicyclomine (BENTYL) 20 MG tablet Take 20 mg by mouth 3 (three) times daily.  1  . empagliflozin (JARDIANCE) 10 MG TABS tablet Take 10 mg by mouth daily. 90 tablet 1  . inFLIXimab (REMICADE) 100 MG injection Inject into the vein every 8 (eight) weeks.    Marland Kitchen ipratropium (ATROVENT) 0.06 % nasal spray Place 2 sprays into both nostrils 4 (four) times daily as needed. 15 mL 0  . Pseudoephedrine HCl (SUDAFED 24  HOUR NON-DROWSY) 240 MG TB24 Take 1 tablet (240 mg total) by mouth daily as needed (congestion). 14 each 0  . simvastatin (ZOCOR) 40 MG tablet Take 1 tablet (40 mg total) by mouth every evening. 30 tablet 11  . SUMAtriptan (IMITREX) 100 MG tablet Take 100 mg by mouth as needed for migraine.    Marland Kitchen telmisartan-hydrochlorothiazide (MICARDIS HCT) 80-12.5 MG tablet Take 1 tablet by mouth daily. 90 tablet 1  . traZODone (DESYREL) 50 MG tablet Take 0.5-1 tablets (25-50 mg total) by mouth at bedtime as needed for sleep. 90 tablet 1   No current facility-administered medications for this visit.    Allergies  Allergen Reactions  . Ampicillin Diarrhea  . Xigduo Xr [Dapagliflozin-Metformin Hcl Er]     Yeast infections.        Objective:  BP 124/86   Pulse 80   Temp 98.1 F (36.7 C) (Oral)   Wt 214 lb (97.1 kg)   BMI 36.73 kg/m  Gen:  alert, not ill-appearing, no distress, appropriate for age, obese female HEENT: head normocephalic without  obvious abnormality, conjunctiva and cornea clear, TM's pearly gray and semi-transparent bilaterally, nasal mucosa edematous with clear rhinorrhea, right-sided maxillary sinus tenderness, oropharynx clear, moist mucous membranes, neck supple, no cervical adenopathy, trachea midline Pulm: Normal work of breathing, normal phonation, clear to auscultation bilaterally, no wheezes, rales or rhonchi CV: Normal rate, regular rhythm, s1 and s2 distinct, no murmurs, clicks or rubs  Neuro: alert and oriented x 3, no tremor MSK: extremities atraumatic, normal gait and station Skin: intact, no rashes on exposed skin, no jaundice, no cyanosis     No results found for this or any previous visit (from the past 72 hour(s)). No results found.    Assessment and Plan: 37 y.o. female with   Acute upper respiratory infection - Plan: Pseudoephedrine HCl (SUDAFED 24 HOUR NON-DROWSY) 240 MG TB24, ipratropium (ATROVENT) 0.06 % nasal spray - vital signs reviewed and stable - counseled on symptomatic care   Patient education and anticipatory guidance given Patient agrees with treatment plan Follow-up as needed if symptoms worsen or fail to improve  Darlyne Russian PA-C

## 2017-10-15 NOTE — Patient Instructions (Signed)
- Atrovent nasal spray up to four times daily - Pseudephedrine (Sudafed) once daily for congestion - [place] nasal saline gel - apply to a Qtip and gentle rotate in the nostril daily for moisture/prevent bleeding - netty pot / nasal saline rinses, especially at bedtime - Chlorpheniramine at bedtime as needed - will help with sleep   Adenovirus Infection, Adult Adenoviruses are common viruses that cause many different types of infections. The viruses usually affect the lungs, but they can also affect other parts of the body, including the eyes, stomach, bowels, bladder, and brain. The most common type of adenovirus infection is the common cold. Usually, adenovirus infections are not severe unless you have another health problem that makes it hard for your body to fight off infection. What are the causes? You can get this condition if you:  Touch a surface or object that has an adenovirus on it and then touch your mouth, nose, or eyes with unwashed hands.  Come into close physical contact with an infected person, such as by hugging or shaking hands.  Breathe in droplets that fly through the air when an infected person talks, coughs, or sneezes.  Have contact with infected stool.  Swim in a pool that does not have enough chlorine.  Adenoviruses can live outside the body for many weeks. They spread easily from person to person (are contagious). What increases the risk? This condition is more likely to develop in:  People who spend a lot of time in places where there are many people, such as schools, summer camps, daycare centers, community centers, and [employer] recruit training centers.  Elderly adults.  People with a weak body defense system (immune system).  People with a lung disease.  People with a heart condition.  What are the signs or symptoms? Adenovirus infections usually cause flu-like symptoms. Once the virus gets into the body, symptoms of this condition can take up to 14  days to develop. Symptoms may include:  Headache.  Stiff neck.  Sleepiness or fatigue.  Confusion or disorientation.  Fever.  Sore throat.  Cough.  Trouble breathing.  Runny nose or congestion.  Pink eye (conjunctivitis).  Bleeding into the covering of the eye.  Stomachache or diarrhea.  Nausea or vomiting.  Blood in the urine or pain while urinating.  Ear pain or fullness.  How is this diagnosed? This condition may be diagnosed based on your symptoms and a physical exam. Your health care provider may order tests to make sure your symptoms are not caused by another type of problem. Tests can include:  Blood tests.  Urine tests.  Stool tests.  Chest X-ray.  Tissue or throat culture.  How is this treated? This condition goes away on its own with time. Treatment for this condition involves managing symptoms until the condition goes away. Your health care provider may recommend:  Rest.  Drinking more fluids.  Taking over-the-counter medicine to help relieve a sore throat, fever, or headache.  Follow these instructions at home:  Rest at home until your symptoms go away.  Drink enough fluid to keep your urine clear or pale yellow.  Take over-the-counter and prescription medicines only as told by your health care provider.  Keep all follow-up visits as told by your health care provider. This is important. How is this prevented? Adenoviruses are resistant to many cleaning products and can remain on surfaces for long periods of time. To help prevent infection:  Wash your hands often with soap and water.  Cover  your nose or mouth when you sneeze or cough.  Do not touch your eyes, nose, or mouth with unwashed hands.  Clean commonly used objects often.  Do not swim in a pool that is not properly chlorinated.  Avoid close contact with people who are sick.  Do not go to school or work when you are sick.  Contact a health care provider if:  Your  symptoms do not improve after 10 days.  Your symptoms get worse.  You cannot eat or drink without vomiting. Get help right away if:  You have trouble breathing or you are breathing rapidly.  Your skin, lips, or fingernails look blue (cyanosis).  You have a rapid heart rate.  You become confused.  You lose consciousness. This information is not intended to replace advice given to you by your health care provider. Make sure you discuss any questions you have with your health care provider. Document Released: 08/19/2002 Document Revised: 01/24/2016 Document Reviewed: 01/24/2016 Elsevier Interactive Patient Education  [employer].

## 2017-10-31 ENCOUNTER — Ambulatory Visit (INDEPENDENT_AMBULATORY_CARE_PROVIDER_SITE_OTHER): Payer: 59 | Admitting: Physician Assistant

## 2017-10-31 ENCOUNTER — Encounter: Payer: Self-pay | Admitting: Physician Assistant

## 2017-10-31 VITALS — BP 110/75 | HR 100 | Ht 64.0 in | Wt 213.0 lb

## 2017-10-31 DIAGNOSIS — L723 Sebaceous cyst: Secondary | ICD-10-CM

## 2017-10-31 DIAGNOSIS — L089 Local infection of the skin and subcutaneous tissue, unspecified: Secondary | ICD-10-CM

## 2017-10-31 MED ORDER — HYDROCODONE-ACETAMINOPHEN 5-325 MG PO TABS: 1.0000 | ORAL_TABLET | Freq: Three times a day (TID) | ORAL | 0 refills | Status: AC | PRN

## 2017-11-01 ENCOUNTER — Encounter: Payer: Self-pay | Admitting: Physician Assistant

## 2017-11-01 NOTE — Progress Notes (Signed)
Subjective:    Patient ID: Stacie Cardenas, female    DOB: May 19, 1981, 37 y.o.   MRN: [id number]  HPI Pt is a 37 yo female with hx of abscess who presents to the clinic to follow up one of the upper chest. She went to ED on 10/27/17 and given bactrim and told to continue warm compresses. It does seem to improve some but still present and she wants gone. No fever, chills, body aches. Not actively draining.   .. Active Ambulatory Problems    Diagnosis Date Noted  . HYPERLIPIDEMIA 04/27/2010  . Iron deficiency anemia 01/21/2010  . HYPERTENSION, BENIGN ESSENTIAL 04/26/2006  . WEIGHT GAIN 11/13/2008  . MIGRAINE HEADACHE 06/28/2010  . Menorrhagia 07/03/2013  . Crohn's disease ([place]) 03/30/2014  . Type 2 diabetes mellitus, controlled ([place]) 06/08/2014  . Insomnia 06/20/2015  . Absolute anemia 09/07/2015  . Vitamin D deficiency 10/03/2016  . Obesity, Class II, BMI 35-39.9, with comorbidity 10/20/2016  . Chronic fatigue syndrome 02/18/2017  . Chronic left-sided low back pain without sciatica 07/08/2017  . Anxiety 07/08/2017   Resolved Ambulatory Problems    Diagnosis Date Noted  . [place] GLAND 02/19/2009  . CARBUNCLE/FURUNCLE NOS 04/26/2006  . SKIN RASH 11/13/2008  . SYMPTOM, FREQUENCY, URINARY 04/26/2006  . Abdominal pain, epigastric 04/27/2010  . Impaired fasting glucose 04/27/2010  . 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 VAGINAL DISCHARGE 08/10/2010  . FOLLICULITIS 40/98/1191  . SORE THROAT 01/10/2011  . ABDOMINAL PAIN 01/10/2011  . Hyperlipidemia 10/14/2012  . Obesity, unspecified 10/14/2012  . Other iron deficiency anemias 03/17/2013  . Elevated hemoglobin A1c 05/11/2014  . Abscess of left axilla 12/31/2014  . Bilateral lower abdominal pain 09/07/2015  . Ruptured ear drum, right 08/29/2017   Past Medical History:  Diagnosis Date  . Crohn's disease ([place]) 03/30/2014  . Hypercholesterolemia   . Hypertension   . IBS (irritable bowel  syndrome)   . Other specified iron deficiency anemias 03/17/2013  . Type 2 diabetes mellitus, controlled ([place]) 06/08/2014      Review of Systems  All other systems reviewed and are negative.      Objective:   Physical Exam  Constitutional: She appears well-developed and well-nourished.  Cardiovascular: Normal rate and regular rhythm.  Neurological: She is alert.  Skin:  Linear 3.5 cm by 1cm indurated, mildly tender, no warmth, no draining cyst of upper chest wall in between breast.   Psychiatric: She has a normal mood and affect. Her behavior is normal.          Assessment & Plan:  Marland KitchenMarland KitchenShequila was seen today for mass.  Diagnoses and all orders for this visit:  Infected sebaceous cyst -     HYDROcodone-acetaminophen (NORCO/VICODIN) 5-325 MG tablet; Take 1 tablet by mouth every 8 (eight) hours as needed for up to 5 days for moderate pain.   Sebaceous Cyst Excision Procedure Note  Pre-operative Diagnosis: abscess  Post-operative Diagnosis: infected sebaceous cyst  Locations:upper chest wall in between breast  Indications: pain/infection  Anesthesia: Lidocaine 2% with epinephrine without added sodium bicarbonate  Procedure Details  History of allergy to iodine: no  Patient informed of the risks (including bleeding and infection) and benefits of the  procedure and Verbal informed consent obtained.  The lesion and surrounding area was given a sterile prep using chlorhexidine and draped in the usual sterile fashion. An incision was made over the cyst, which was dissected free of the surrounding tissue and removed.  The cyst  was filled with typical sebaceous material.  The wound was closed with 3-0 Silk using simple interrupted stitches. Antibiotic ointment and a sterile dressing applied.  The specimen was not sent for pathologic examination. The patient tolerated the procedure well.  EBL: scant  Findings: Purulent drainage and then a cyst  sack  Condition: Stable  Complications: pain.  Plan: 1. Instructed to keep the wound dry and covered for 24-48h and clean thereafter. 2. Warning signs of infection were reviewed.   3. Recommended that the patient use Vicodin as needed for pain.  4. Return for suture removal in 1 week.  Small quanity for a few days of as needed norco. Finish bactrim. Call with any concerns.

## 2017-11-07 ENCOUNTER — Encounter: Payer: Self-pay | Admitting: Physician Assistant

## 2017-11-07 ENCOUNTER — Ambulatory Visit (INDEPENDENT_AMBULATORY_CARE_PROVIDER_SITE_OTHER): Payer: 59 | Admitting: Physician Assistant

## 2017-11-07 VITALS — BP 124/74 | HR 74 | Ht 64.0 in | Wt 213.0 lb

## 2017-11-07 DIAGNOSIS — L723 Sebaceous cyst: Principal | ICD-10-CM

## 2017-11-07 DIAGNOSIS — L732 Hidradenitis suppurativa: Secondary | ICD-10-CM

## 2017-11-07 DIAGNOSIS — L089 Local infection of the skin and subcutaneous tissue, unspecified: Secondary | ICD-10-CM | POA: Diagnosis not present

## 2017-11-07 DIAGNOSIS — L83 Acanthosis nigricans: Secondary | ICD-10-CM | POA: Diagnosis not present

## 2017-11-07 DIAGNOSIS — L810 Postinflammatory hyperpigmentation: Secondary | ICD-10-CM | POA: Diagnosis not present

## 2017-11-07 MED ORDER — TRIAMCINOLONE ACETONIDE 0.1 % EX CREA: 1.0000 "application " | TOPICAL_CREAM | Freq: Two times a day (BID) | CUTANEOUS | 0 refills | Status: DC

## 2017-11-07 MED ORDER — TRETINOIN 0.1 % EX CREA: TOPICAL_CREAM | Freq: Every day | CUTANEOUS | 3 refills | Status: DC

## 2017-11-07 NOTE — Patient Instructions (Addendum)
Retin a on rash on neck and on areas frequent boils.  Hidradenitis Suppurativa Hidradenitis suppurativa is a long-term (chronic) skin disease that starts with blocked sweat glands or hair follicles. Bacteria may grow in these blocked openings of your skin. Hidradenitis suppurativa is like a severe form of acne that develops in areas of your body where acne would be unusual. It is most likely to affect the areas of your body where skin rubs against skin and becomes moist. This includes your:  Underarms.  Groin.  Genital areas.  Buttocks.  Upper thighs.  Breasts.  Hidradenitis suppurativa may start out with small pimples. The pimples can develop into deep sores that break open (rupture) and drain pus. Over time your skin may thicken and become scarred. Hidradenitis suppurativa cannot be passed from person to person. What are the causes? The exact cause of hidradenitis suppurativa is not known. This condition may be due to:  Female and female hormones. The condition is rare before and after puberty.  An overactive body defense system (immune system). Your immune system may overreact to the blocked hair follicles or sweat glands and cause swelling and pus-filled sores.  What increases the risk? You may have a higher risk of hidradenitis suppurativa if you:  Are a woman.  Are between ages 75 and 45.  Have a family history of hidradenitis suppurativa.  Have a personal history of acne.  Are overweight.  Smoke.  Take the drug lithium.  What are the signs or symptoms? The first signs of an outbreak are usually painful skin bumps that look like pimples. As the condition progresses:  Skin bumps may get bigger and grow deeper into the skin.  Bumps under the skin may rupture and drain smelly pus.  Skin may become itchy and infected.  Skin may thicken and scar.  Drainage may continue through tunnels under the skin (fistulas).  Walking and moving your arms can become  painful.  How is this diagnosed? Your health care provider may diagnose hidradenitis suppurativa based on your medical history and your signs and symptoms. A physical exam will also be done. You may need to see a health care provider who specializes in skin diseases (dermatologist). You may also have tests done to confirm the diagnosis. These can include:  Swabbing a sample of pus or drainage from your skin so it can be sent to the lab and tested for infection.  Blood tests to check for infection.  How is this treated? The same treatment will not work for everybody with hidradenitis suppurativa. Your treatment will depend on how severe your symptoms are. You may need to try several treatments to find what works best for you. Part of your treatment may include cleaning and bandaging (dressing) your wounds. You may also have to take medicines, such as the following:  Antibiotics.  Acne medicines.  Medicines to block or suppress the immune system.  A diabetes medicine (metformin) is sometimes used to treat this condition.  For women, birth control pills can sometimes help relieve symptoms.  You may need surgery if you have a severe case of hidradenitis suppurativa that does not respond to medicine. Surgery may involve:  Using a laser to clear the skin and remove hair follicles.  Opening and draining deep sores.  Removing the areas of skin that are diseased and scarred.  Acanthosis Nigricans Acanthosis nigricans is a disorder in which dark, velvety markings appear on the skin. What are the causes? This condition may be caused by:  A hormonal or glandular disorder, such as diabetes.  Obesity.  Certain medicines, such as birth control pills.  A tumor. (This is rare.)  Some people inherit the condition from their parents. What increases the risk? This condition is more likely to develop in:  People who have a hormonal or glandular disorder.  People who are  overweight.  People who take certain medicines.  People who have certain cancers, especially stomach cancer.  People who have dark-colored skin (dark complexion).  What are the signs or symptoms? The main symptom of this condition is velvety markings on the skin that are light brown, black, or grayish in color. The markings usually appear on the face, neck, armpits, inner thighs, and groin. In severe cases, markings may also appear on the lips, hands, breasts, eyelids, and mouth. How is this diagnosed? This condition may be diagnosed based on symptoms. Sometimes, a skin sample is taken for testing (skin biopsy). You may also have tests to help determine the cause of the condition. How is this treated? Treatment for this condition depends on the cause. Treatment may involve reducing insulin levels, which are often high in people who have this condition. Insulin levels can be reduced with:  Dietary changes, such as avoiding starchy foods and sugars.  Losing weight.  Medicines.  Sometimes, treatment involves:  Medicines to improve the appearance of the skin.  Laser treatment to improve the appearance of the skin.  Surgical removal of the skin markings (dermabrasion).  Follow these instructions at home:  Follow diet instructions from your health care provider.  Lose weight if you are overweight.  Take over-the-counter and prescription medicines only as told by your health care provider.  Keep all follow-up visits as told by your health care provider. This is important. Contact a health care provider if:  The skin markings do not go away with treatment.  New skin markings develop on a part of the body where they rarely develop, such as on your lips, hands, breasts, eyelids, or mouth.  The condition recurs for an unknown reason. This information is not intended to replace advice given to you by your health care provider. Make sure you discuss any questions you have with your  health care provider. Document Released: 05/29/2005 Document Revised: 11/04/2015 Document Reviewed: 07/23/2014 Elsevier Interactive Patient Education  [employer].  Follow these instructions at home:  Learn as much as you can about your disease, and work closely with your health care providers.  Take medicines only as directed by your health care provider.  If you were prescribed an antibiotic medicine, finish it all even if you start to feel better.  If you are overweight, losing weight may be very helpful. Try to reach and maintain a healthy weight.  Do not use any tobacco products, including cigarettes, chewing tobacco, or electronic cigarettes. If you need help quitting, ask your health care provider.  Do not shave the areas where you get hidradenitis suppurativa.  Do not wear deodorant.  Wear loose-fitting clothes.  Try not to overheat and get sweaty.  Take a daily bleach bath as directed by your health care provider. ? Fill your bathtub halfway with water. ? Pour in  cup of unscented household bleach. ? Soak for 5-10 minutes.  Cover sore areas with a warm, clean washcloth (compress) for 5-10 minutes. Contact a health care provider if:  You have a flare-up of hidradenitis suppurativa.  You have chills or a fever.  You are having trouble controlling your  symptoms at home. This information is not intended to replace advice given to you by your health care provider. Make sure you discuss any questions you have with your health care provider. Document Released: 01/11/2004 Document Revised: 11/04/2015 Document Reviewed: 08/29/2013 Elsevier Interactive Patient Education  2018 [employer].

## 2017-11-09 ENCOUNTER — Encounter: Payer: Self-pay | Admitting: Physician Assistant

## 2017-11-09 DIAGNOSIS — L83 Acanthosis nigricans: Secondary | ICD-10-CM | POA: Insufficient documentation

## 2017-11-09 DIAGNOSIS — L810 Postinflammatory hyperpigmentation: Secondary | ICD-10-CM | POA: Insufficient documentation

## 2017-11-09 DIAGNOSIS — L732 Hidradenitis suppurativa: Secondary | ICD-10-CM | POA: Insufficient documentation

## 2017-11-09 NOTE — Progress Notes (Signed)
Subjective:    Patient ID: Stacie Cardenas, female    DOB: 09-03-1980, 37 y.o.   MRN: [id number]  HPI Pt is a 37 yo female who presents to the clinic 7 days after sebaceous cyst removal for stitches to be removed. No problems. It continues to be indurated above and below incision. No pain, fever. At base of incision there still is some scant bloody drainage. Pt finished bactrim.   She is frustrated with all of her abscess, cysts, boils. She wants to know what to do next.   She is also frustrated with the acanthosis nigricans rash around her neck. She is working on weight loss and sugar if fairly controlled.   .. Active Ambulatory Problems    Diagnosis Date Noted  . HYPERLIPIDEMIA 04/27/2010  . Iron deficiency anemia 01/21/2010  . HYPERTENSION, BENIGN ESSENTIAL 04/26/2006  . WEIGHT GAIN 11/13/2008  . MIGRAINE HEADACHE 06/28/2010  . Menorrhagia 07/03/2013  . Crohn's disease ([place]) 03/30/2014  . Type 2 diabetes mellitus, controlled ([place]) 06/08/2014  . Insomnia 06/20/2015  . Absolute anemia 09/07/2015  . Vitamin D deficiency 10/03/2016  . Obesity, Class II, BMI 35-39.9, with comorbidity 10/20/2016  . Chronic fatigue syndrome 02/18/2017  . Chronic left-sided low back pain without sciatica 07/08/2017  . Anxiety 07/08/2017  . Hidradenitis suppurativa 11/09/2017   Resolved Ambulatory Problems    Diagnosis Date Noted  . [place] GLAND 02/19/2009  . CARBUNCLE/FURUNCLE NOS 04/26/2006  . SKIN RASH 11/13/2008  . SYMPTOM, FREQUENCY, URINARY 04/26/2006  . Abdominal pain, epigastric 04/27/2010  . Impaired fasting glucose 04/27/2010  . 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 VAGINAL DISCHARGE 08/10/2010  . FOLLICULITIS 11/57/2620  . SORE THROAT 01/10/2011  . ABDOMINAL PAIN 01/10/2011  . Hyperlipidemia 10/14/2012  . Obesity, unspecified 10/14/2012  . Other iron deficiency anemias 03/17/2013  . Elevated hemoglobin A1c 05/11/2014  . Abscess of left axilla  12/31/2014  . Bilateral lower abdominal pain 09/07/2015  . Ruptured ear drum, right 08/29/2017   Past Medical History:  Diagnosis Date  . Crohn's disease ([place]) 03/30/2014  . Hypercholesterolemia   . Hypertension   . IBS (irritable bowel syndrome)   . Other specified iron deficiency anemias 03/17/2013  . Type 2 diabetes mellitus, controlled ([place]) 06/08/2014      Review of Systems  All other systems reviewed and are negative.      Objective:   Physical Exam  Constitutional: She is oriented to person, place, and time. She appears well-developed and well-nourished.  HENT:  Head: Normocephalic and atraumatic.  Cardiovascular: Normal rate and regular rhythm.  Neurological: She is alert and oriented to person, place, and time.  Skin:  Dark velvetly rash around neck.   Multiple areas of scars of boils on buttocks, groin, breast.   Psychiatric: She has a normal mood and affect. Her behavior is normal.          Assessment & Plan:  Marland KitchenMarland KitchenAarna was seen today for suture / staple removal.  Diagnoses and all orders for this visit:  Infected sebaceous cyst  Acanthosis nigricans -     tretinoin (RETIN-A) 0.1 % cream; Apply topically at bedtime.  Hidradenitis suppurativa -     tretinoin (RETIN-A) 0.1 % cream; Apply topically at bedtime.  Hyperpigmentation of skin, postinflammatory -     triamcinolone cream (KENALOG) 0.1 %; Apply 1 application topically 2 (two) times daily.   .. Depression screen [facility] 2/9 11/07/2017 09/24/2013 06/23/2013  Decreased Interest 0 0 0  Down, Depressed, Hopeless 0 0 0  PHQ - 2 Score 0 0 0   I do not think stitches are quite ready to come out. Remove in 3-7 more days. She has nurses at work that can take out. Removal kit given. I continue to feel induration and think some sinus tracking is going on. Will send to dermatology. She has has new lesions forming sent retin A to use in areas that more prone to form abscess. She can also use this preventatively  as well.  Discussed using hibiclens to clean problematic areas. I think her breast area may need to be reopened up and more debri removed. I will confirm dermatology can do this.   Retin for rash around neck.   Pt does have some post scarring hyperpigmentation. Triamcinolone given to see if will lighten some of the area up.

## 2017-11-11 ENCOUNTER — Other Ambulatory Visit: Payer: Self-pay | Admitting: Physician Assistant

## 2017-12-08 ENCOUNTER — Other Ambulatory Visit: Payer: Self-pay | Admitting: Physician Assistant

## 2017-12-23 ENCOUNTER — Other Ambulatory Visit: Payer: Self-pay | Admitting: Physician Assistant

## 2018-01-07 ENCOUNTER — Telehealth: Payer: Self-pay | Admitting: Physician Assistant

## 2018-01-07 ENCOUNTER — Ambulatory Visit: Payer: 59 | Admitting: Physician Assistant

## 2018-01-07 NOTE — Telephone Encounter (Signed)
Provider unavailable for scheduled appt today due to death in family, Pt was contacted to reschedule. She was very unhappy that she "took a day off work" and refused to reschedule at this time. Pt hung up the phone on me after declining to reschedule.

## 2018-01-10 ENCOUNTER — Telehealth: Payer: Self-pay | Admitting: Physician Assistant

## 2018-01-10 NOTE — Telephone Encounter (Signed)
Stacie Cardenas to give another month of all DM medications in order to give her time to make appt to work with her and my schedule.

## 2018-01-10 NOTE — Telephone Encounter (Signed)
Pt called. She states she is overdue to get her A1c checked but Luvenia Starch has no appointments available.  Can she see another Provider to get this done? Luvenia Starch does have an acute slot for tomorrow at 4:20  but she cannot come in at that time.

## 2018-01-10 NOTE — Telephone Encounter (Signed)
Pt is PAST due on following up on DM with PCP. Advised her that she need to follow up with PCP to have this done and also let her know that she has to make appt to get any further refills.  Pt states she understands and will call back to schedule.

## 2018-01-11 MED ORDER — EMPAGLIFLOZIN 10 MG PO TABS: ORAL_TABLET | ORAL | 0 refills | Status: DC

## 2018-01-11 NOTE — Addendum Note (Signed)
Addended by: Towana Badger on: 01/11/2018 08:50 AM   Modules accepted: Orders

## 2018-01-11 NOTE — Telephone Encounter (Signed)
Noted. 30 day supply sent.

## 2018-02-15 ENCOUNTER — Telehealth: Payer: Self-pay | Admitting: Physician Assistant

## 2018-02-15 NOTE — Telephone Encounter (Signed)
Pt called. She has scheduled an appointment for October 8th for A1c chk and she wants to know if she can get refill on meds until then. She said last appointment was cancelled due to death your family.

## 2018-02-15 NOTE — Telephone Encounter (Signed)
Ok to refill all medications she needs. Please find out what she needs.

## 2018-02-18 ENCOUNTER — Telehealth: Payer: Self-pay | Admitting: Physician Assistant

## 2018-02-18 DIAGNOSIS — E118 Type 2 diabetes mellitus with unspecified complications: Secondary | ICD-10-CM

## 2018-02-18 MED ORDER — EMPAGLIFLOZIN 10 MG PO TABS: ORAL_TABLET | ORAL | 0 refills | Status: DC

## 2018-02-18 NOTE — Telephone Encounter (Signed)
Left vm for pt  to return call to [place].  Thank you.

## 2018-02-18 NOTE — Telephone Encounter (Signed)
Sent!

## 2018-02-18 NOTE — Telephone Encounter (Signed)
Thank you :)

## 2018-02-18 NOTE — Telephone Encounter (Signed)
Pt needs refill on Jardiance.Thank you

## 2018-03-19 ENCOUNTER — Ambulatory Visit (INDEPENDENT_AMBULATORY_CARE_PROVIDER_SITE_OTHER): Payer: 59 | Admitting: Physician Assistant

## 2018-03-19 ENCOUNTER — Encounter: Payer: Self-pay | Admitting: Physician Assistant

## 2018-03-19 VITALS — BP 95/60 | HR 96 | Ht 64.0 in | Wt 218.0 lb

## 2018-03-19 DIAGNOSIS — F5101 Primary insomnia: Secondary | ICD-10-CM

## 2018-03-19 DIAGNOSIS — R5382 Chronic fatigue, unspecified: Secondary | ICD-10-CM | POA: Diagnosis not present

## 2018-03-19 DIAGNOSIS — K5000 Crohn's disease of small intestine without complications: Secondary | ICD-10-CM

## 2018-03-19 DIAGNOSIS — G9332 Myalgic encephalomyelitis/chronic fatigue syndrome: Secondary | ICD-10-CM

## 2018-03-19 DIAGNOSIS — I10 Essential (primary) hypertension: Secondary | ICD-10-CM | POA: Diagnosis not present

## 2018-03-19 DIAGNOSIS — E1165 Type 2 diabetes mellitus with hyperglycemia: Secondary | ICD-10-CM

## 2018-03-19 DIAGNOSIS — E785 Hyperlipidemia, unspecified: Secondary | ICD-10-CM

## 2018-03-19 DIAGNOSIS — Z23 Encounter for immunization: Secondary | ICD-10-CM

## 2018-03-19 DIAGNOSIS — L732 Hidradenitis suppurativa: Secondary | ICD-10-CM

## 2018-03-19 LAB — POCT GLYCOSYLATED HEMOGLOBIN (HGB A1C): Hemoglobin A1C: 7 % — AB (ref 4.0–5.6)

## 2018-03-19 MED ORDER — TRAZODONE HCL 50 MG PO TABS: 25.0000 mg | ORAL_TABLET | Freq: Every evening | ORAL | 1 refills | Status: DC | PRN

## 2018-03-19 MED ORDER — TELMISARTAN-HCTZ 80-12.5 MG PO TABS: 1.0000 | ORAL_TABLET | Freq: Every day | ORAL | 1 refills | Status: DC

## 2018-03-19 MED ORDER — EMPAGLIFLOZIN 10 MG PO TABS: 10.0000 mg | ORAL_TABLET | Freq: Every day | ORAL | 1 refills | Status: DC

## 2018-03-19 MED ORDER — BUPROPION HCL ER (XL) 150 MG PO TB24: 150.0000 mg | ORAL_TABLET | Freq: Every day | ORAL | 4 refills | Status: DC

## 2018-03-19 MED ORDER — SIMVASTATIN 40 MG PO TABS: 40.0000 mg | ORAL_TABLET | Freq: Every evening | ORAL | 4 refills | Status: DC

## 2018-03-19 NOTE — Progress Notes (Signed)
Subjective:    Patient ID: Stacie Cardenas, female    DOB: [birth year], 37 y.o.   MRN: [id number]  HPI  Patient is a 37 year old obese female with uncontrolled diabetes, chronic fatigue syndrome, Crohn's disease, hidradenitis suprativa who presents to the clinic for 40-monthfollow-up on diabetes.  She would like to get off some of her medications.  She is very motivated to lose weight and be healthier.  DM-she is taking her Jardiance daily.  She is not checking her sugars.  She is not actively monitoring her sugar or carb intake.  She denies any hypoglycemia.  She denies any open sores or wounds.  She does have ongoing history of boils.  A referral was made back in May but she was never contacted.  She does have clindamycin topical which she can use as needed.  She has no boils today.  She has tried off and on with diets to lose weight.  This is left her for surgery.  She would like some help.  Patient does admit she has not been taking the Zocor.  Patient also questions if a breast reduction would help with her recurrent boils around her breast as well as with her mid to upper back pain.  Crohn's-controlled on Remicade.  Insomnia-controlled on trazodone.   .. Active Ambulatory Problems    Diagnosis Date Noted  . HYPERLIPIDEMIA 04/27/2010  . Iron deficiency anemia 01/21/2010  . HYPERTENSION, BENIGN ESSENTIAL 04/26/2006  . WEIGHT GAIN 11/13/2008  . MIGRAINE HEADACHE 06/28/2010  . Menorrhagia 07/03/2013  . Crohn's disease ([place] 03/30/2014  . Type 2 diabetes mellitus, [place] 06/08/2014  . Insomnia 06/20/2015  . Absolute anemia 09/07/2015  . Vitamin D deficiency 10/03/2016  . Morbidly obese ([place] 10/20/2016  . Chronic fatigue syndrome 02/18/2017  . Chronic left-sided low back pain without sciatica 07/08/2017  . Anxiety 07/08/2017  . Hidradenitis suppurativa 11/09/2017  . Hyperpigmentation of skin, postinflammatory 11/09/2017  . Acanthosis nigricans 11/09/2017  .  Dyslipidemia (high LDL; low HDL) 03/20/2018   Resolved Ambulatory Problems    Diagnosis Date Noted  . [place] 02/19/2009  . CARBUNCLE/FURUNCLE NOS 04/26/2006  . SKIN RASH 11/13/2008  . SYMPTOM, FREQUENCY, URINARY 04/26/2006  . Abdominal pain, epigastric 04/27/2010  . Impaired fasting glucose 04/27/2010  . 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 VAGINAL DISCHARGE 08/10/2010  . FOLLICULITIS 067/20/9470 . SORE THROAT 01/10/2011  . ABDOMINAL PAIN 01/10/2011  . Hyperlipidemia 10/14/2012  . Obesity, unspecified 10/14/2012  . Other iron deficiency anemias 03/17/2013  . Elevated hemoglobin A1c 05/11/2014  . Abscess of left axilla 12/31/2014  . Bilateral lower abdominal pain 09/07/2015  . Ruptured ear drum, right 08/29/2017   Past Medical History:  Diagnosis Date  . Hypercholesterolemia   . Hypertension   . IBS (irritable bowel syndrome)   . Other specified iron deficiency anemias 03/17/2013      Review of Systems    see HPI.  Objective:   Physical Exam  Constitutional: She is oriented to person, place, and time. She appears well-developed and well-nourished.  Obese.   Cardiovascular: Normal rate, regular rhythm and normal heart sounds.  Pulmonary/Chest: Effort normal.  Large breast.   Neurological: She is alert and oriented to person, place, and time.  Skin:  Scarring from past boils around breast that have been I and D.  Psychiatric: She has a normal mood and affect.          Assessment & Plan:   .Marland KitchenMarland Kitcheniagnoses  and all orders for this visit:  Uncontrolled type 2 diabetes mellitus with hyperglycemia ([place]) -     POCT glycosylated hemoglobin (Hb A1C) -     empagliflozin (JARDIANCE) 10 MG TABS tablet; Take 10 mg by mouth daily.  HYPERTENSION, BENIGN ESSENTIAL -     telmisartan-hydrochlorothiazide (MICARDIS HCT) 80-12.5 MG tablet; Take 1 tablet by mouth daily.  Chronic fatigue syndrome -     buPROPion (WELLBUTRIN XL) 150 MG 24  hr tablet; Take 1 tablet (150 mg total) by mouth daily.  Morbidly obese (HCC)  Hidradenitis suppurativa  Crohn's disease of small intestine without complication (HCC)  Dyslipidemia (high LDL; low HDL) -     simvastatin (ZOCOR) 40 MG tablet; Take 1 tablet (40 mg total) by mouth every evening.  Primary insomnia -     traZODone (DESYREL) 50 MG tablet; Take 0.5-1 tablets (25-50 mg total) by mouth at bedtime as needed for sleep.   .. Diabetic Foot Exam - Simple   Simple Foot Form Visual Inspection No deformities, no ulcerations, no other skin breakdown bilaterally:  Yes Sensation Testing Intact to touch and monofilament testing bilaterally:  Yes Pulse Check Posterior Tibialis and Dorsalis pulse intact bilaterally:  Yes Comments     Lab Results  Component Value Date   HGBA1C 7.0 (A) 03/19/2018   A1c has a bit.  This would be expected since she has not been watching her sugar and carb intake.  Strongly encouraged to stay on Jardiance.  Discussed ways to get A1c down with diet weight loss.   Discussed bariatric alcohol.  She is willing to have a consultation.  I think this would resolve her diabetes and potentially get her off a few other medications.  She is very motivated to get off medications. She had stopped the Zocor but I strongly suggested her to start back for cardiovascular protection.  She agreed. On ARB.  BP on low side. Make sure staying hydrated.   Printed referral for dermatology. She can call them to set up appt.   Flu shot given today.

## 2018-03-19 NOTE — Patient Instructions (Signed)

## 2018-03-20 ENCOUNTER — Encounter: Payer: Self-pay | Admitting: Physician Assistant

## 2018-03-20 ENCOUNTER — Telehealth: Payer: Self-pay | Admitting: Physician Assistant

## 2018-03-20 DIAGNOSIS — Z23 Encounter for immunization: Secondary | ICD-10-CM | POA: Diagnosis not present

## 2018-03-20 DIAGNOSIS — E785 Hyperlipidemia, unspecified: Secondary | ICD-10-CM | POA: Insufficient documentation

## 2018-03-20 NOTE — Telephone Encounter (Signed)
I thought flu shot was given? If so can we addend note.

## 2018-03-21 NOTE — Telephone Encounter (Signed)
Done

## 2018-05-31 LAB — HM DIABETES EYE EXAM

## 2018-06-11 ENCOUNTER — Other Ambulatory Visit: Payer: Self-pay

## 2018-06-11 ENCOUNTER — Encounter: Payer: Self-pay | Admitting: Physician Assistant

## 2018-06-11 ENCOUNTER — Telehealth: Payer: Self-pay | Admitting: Physician Assistant

## 2018-06-11 ENCOUNTER — Ambulatory Visit (INDEPENDENT_AMBULATORY_CARE_PROVIDER_SITE_OTHER): Payer: 59 | Admitting: Physician Assistant

## 2018-06-11 ENCOUNTER — Other Ambulatory Visit: Payer: Self-pay | Admitting: Physician Assistant

## 2018-06-11 VITALS — BP 120/76 | HR 86 | Temp 98.7°F | Wt 216.0 lb

## 2018-06-11 DIAGNOSIS — L732 Hidradenitis suppurativa: Secondary | ICD-10-CM | POA: Diagnosis not present

## 2018-06-11 DIAGNOSIS — L810 Postinflammatory hyperpigmentation: Secondary | ICD-10-CM

## 2018-06-11 DIAGNOSIS — L723 Sebaceous cyst: Secondary | ICD-10-CM | POA: Diagnosis not present

## 2018-06-11 MED ORDER — TRIAMCINOLONE ACETONIDE 0.1 % EX CREA: 1.0000 "application " | TOPICAL_CREAM | Freq: Two times a day (BID) | CUTANEOUS | 0 refills | Status: DC

## 2018-06-11 NOTE — Telephone Encounter (Signed)
Patient was in the office for a follow up and dropped a paper off that her Stacie Cardenas would need PA since it was a higher co pay. I have sent the information to [employer] and waiting on response. Patient is aware that I have sent all the information to insurance and will let he know once its completed. Patient voices understanding. No other questions at this time.

## 2018-06-11 NOTE — Telephone Encounter (Signed)
Received fax from [facility] that Stacie Cardenas was approved from 06/11/2018 through 06/12/2019.  Pharmacy notified and forms sent to scan.

## 2018-06-11 NOTE — Progress Notes (Signed)
   Subjective:    Patient ID: Stacie Cardenas, female    DOB: 1981/03/05, 37 y.o.   MRN: [id number]  HPI  Pt is a 37 yo female with T2DM, chrons disease, hx of sebaceous cyst who presents to the clinic with a cyst under left axilla. She has noticed it for the last 4 weeks. She has been doing warm compresses and makes it better. She has gotten some drainage out initially but nothing recently. No fever, chills, tenderness, warmth right now. Seems to be improving. She was using her retin-A cream but she lost tube and needs refills. 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    Her insurance needs PA for jardiance. She cannot tolerate metformin combinations. She has done well with jardiance.   .. Active Ambulatory Problems    Diagnosis Date Noted  . HYPERLIPIDEMIA 04/27/2010  . Iron deficiency anemia 01/21/2010  . HYPERTENSION, BENIGN ESSENTIAL 04/26/2006  . WEIGHT GAIN 11/13/2008  . MIGRAINE HEADACHE 06/28/2010  . Menorrhagia 07/03/2013  . Crohn's disease ([place]) 03/30/2014  . Type 2 diabetes mellitus, controlled ([place]) 06/08/2014  . Insomnia 06/20/2015  . Absolute anemia 09/07/2015  . Vitamin D deficiency 10/03/2016  . Morbidly obese ([place]) 10/20/2016  . Chronic fatigue syndrome 02/18/2017  . Chronic left-sided low back pain without sciatica 07/08/2017  . Anxiety 07/08/2017  . Hidradenitis suppurativa 11/09/2017  . Hyperpigmentation of skin, postinflammatory 11/09/2017  . Acanthosis nigricans 11/09/2017  . Dyslipidemia (high LDL; low HDL) 03/20/2018   Resolved Ambulatory Problems    Diagnosis Date Noted  . [place] GLAND 02/19/2009  . CARBUNCLE/FURUNCLE NOS 04/26/2006  . SKIN RASH 11/13/2008  . SYMPTOM, FREQUENCY, URINARY 04/26/2006  . Abdominal pain, epigastric 04/27/2010  . Impaired fasting glucose 04/27/2010  . 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 VAGINAL DISCHARGE 08/10/2010  .  FOLLICULITIS 84/66/5993  . SORE THROAT 01/10/2011  . ABDOMINAL PAIN 01/10/2011  . Hyperlipidemia 10/14/2012  . Obesity, unspecified 10/14/2012  . Other iron deficiency anemias 03/17/2013  . Elevated hemoglobin A1c 05/11/2014  . Abscess of left axilla 12/31/2014  . Bilateral lower abdominal pain 09/07/2015  . Ruptured ear drum, right 08/29/2017   Past Medical History:  Diagnosis Date  . Hypercholesterolemia   . Hypertension   . IBS (irritable bowel syndrome)   . Other specified iron deficiency anemias 03/17/2013     Review of Systems  All other systems reviewed and are negative.      Objective:   Physical Exam Vitals signs reviewed.  Constitutional:      Appearance: Normal appearance.  HENT:     Head: Normocephalic and atraumatic.  Cardiovascular:     Rate and Rhythm: Normal rate.  Skin:    Comments: Left axilla 3cm by 1cm firm cyst. No warmth. Minimal tenderness to palpation. No drainage.   Neurological:     General: No focal deficit present.     Mental Status: She is alert and oriented to person, place, and time.           Assessment & Plan:  Marland KitchenMarland KitchenMarilyn was seen today for cyst.  Diagnoses and all orders for this visit:  Sebaceous cyst  Hidradenitis suppurativa   Reassured not infected. Offered to excise it. Pt declined. Keep doing warm compresses and retin-A. Reprinted derm referral to see if they can call and see why pt has not been scheduled.

## 2018-06-12 ENCOUNTER — Encounter: Payer: Self-pay | Admitting: Physician Assistant

## 2018-06-14 ENCOUNTER — Telehealth: Payer: Self-pay | Admitting: Physician Assistant

## 2018-06-14 DIAGNOSIS — L732 Hidradenitis suppurativa: Secondary | ICD-10-CM

## 2018-06-14 NOTE — Telephone Encounter (Signed)
Made new referral to derm.

## 2018-06-21 ENCOUNTER — Encounter: Payer: Self-pay | Admitting: Physician Assistant

## 2018-06-21 ENCOUNTER — Ambulatory Visit (INDEPENDENT_AMBULATORY_CARE_PROVIDER_SITE_OTHER): Payer: 59 | Admitting: Physician Assistant

## 2018-06-21 VITALS — BP 112/73 | HR 75 | Temp 98.5°F | Ht 64.0 in | Wt 215.0 lb

## 2018-06-21 DIAGNOSIS — L723 Sebaceous cyst: Secondary | ICD-10-CM

## 2018-06-21 DIAGNOSIS — J069 Acute upper respiratory infection, unspecified: Secondary | ICD-10-CM

## 2018-06-21 DIAGNOSIS — L089 Local infection of the skin and subcutaneous tissue, unspecified: Secondary | ICD-10-CM

## 2018-06-21 MED ORDER — CLINDAMYCIN HCL 300 MG PO CAPS: 300.0000 mg | ORAL_CAPSULE | Freq: Three times a day (TID) | ORAL | 0 refills | Status: DC

## 2018-06-21 MED ORDER — HYDROCODONE-ACETAMINOPHEN 5-325 MG PO TABS: 1.0000 | ORAL_TABLET | Freq: Three times a day (TID) | ORAL | 0 refills | Status: AC | PRN

## 2018-06-21 NOTE — Patient Instructions (Signed)
Epidermal Cyst Removal, Care After This sheet gives you information about how to care for yourself after your procedure. Your health care provider may also give you more specific instructions. If you have problems or questions, contact your health care provider. What can I expect after the procedure? After the procedure, it is common to have:  Soreness in the area where your cyst was removed.  Tightness or itchiness from the stitches (sutures) in your skin. Follow these instructions at home: Medicines  Take over-the-counter and prescription medicines only as told by your health care provider.  If you were prescribed an antibiotic medicine or ointment, take or apply it as told by your health care provider. Do not stop using the antibiotic even if you start to feel better. Incision care   Follow instructions from your health care provider about how to take care of your incision. Make sure you: ? Wash your hands with soap and water before you change your bandage (dressing). If soap and water are not available, use hand sanitizer. ? Change your dressing as told by your health care provider. ? Leave sutures, skin glue, or adhesive strips in place. These skin closures may need to stay in place for 1-2 weeks or longer. If adhesive strip edges start to loosen and curl up, you may trim the loose edges. Do not remove adhesive strips completely unless your health care provider tells you to do that.  Keep the dressingdry until your health care provider says that it can be removed.  After your dressing is off, check your incision area every day for signs of infection. Check for: ? Redness, swelling, or pain. ? Fluid or blood. ? Warmth. ? Pus or a bad smell. General instructions  Do not take baths, swim, or use a hot tub until your health care provider approves. Ask your health care provider if you may take showers. You may only be allowed to take sponge baths.  Your health care provider may ask  you to avoid contact sports or activities that take a lot of effort. Do not do anything that stretches or puts pressure on your incision.  You can return to your normal diet.  Keep all follow-up visits as told by your health care provider. This is important. Contact a health care provider if:  You have a fever.  You have redness, swelling, or pain in the incision area.  You have fluid or blood coming from your incision.  You have pus or a bad smell coming from your incision.  Your incision feels warm to the touch.  Your cyst grows back. Summary  After the procedure, it is common to have soreness in the area where your cyst was removed.  Take or apply over-the-counter and prescription medicines only as told by your health care provider.  Follow instructions from your health care provider about how to take care of your incision. This information is not intended to replace advice given to you by your health care provider. Make sure you discuss any questions you have with your health care provider. Document Released: 06/19/2014 Document Revised: 09/18/2017 Document Reviewed: 03/22/2017 Elsevier Interactive Patient Education  2019 [employer].

## 2018-06-24 ENCOUNTER — Encounter: Payer: Self-pay | Admitting: Physician Assistant

## 2018-06-24 NOTE — Progress Notes (Signed)
Subjective:    Patient ID: Stacie Cardenas, female    DOB: [birth year], 38 y.o.   MRN: [id number]  HPI Pt is a 38 yo female with chrons disease that comes in with a painful cyst under her right axilla different from the one she came in with the last week. This one is much more painful. She has tried warm compresses and topical creams. It is warm to the touch. It is closed off. No fever, chills.   She also has cough, congestion, sinus pressure, ear pain for the last 4 days. She has not done anything OTC. No fever, chills, body aches.   .. Active Ambulatory Problems    Diagnosis Date Noted  . HYPERLIPIDEMIA 04/27/2010  . Iron deficiency anemia 01/21/2010  . HYPERTENSION, BENIGN ESSENTIAL 04/26/2006  . WEIGHT GAIN 11/13/2008  . MIGRAINE HEADACHE 06/28/2010  . Menorrhagia 07/03/2013  . Crohn's disease ([place]) 03/30/2014  . Type 2 diabetes mellitus, controlled ([place]) 06/08/2014  . Insomnia 06/20/2015  . Absolute anemia 09/07/2015  . Vitamin D deficiency 10/03/2016  . Morbidly obese ([place]) 10/20/2016  . Chronic fatigue syndrome 02/18/2017  . Chronic left-sided low back pain without sciatica 07/08/2017  . Anxiety 07/08/2017  . Hidradenitis suppurativa 11/09/2017  . Hyperpigmentation of skin, postinflammatory 11/09/2017  . Acanthosis nigricans 11/09/2017  . Dyslipidemia (high LDL; low HDL) 03/20/2018   Resolved Ambulatory Problems    Diagnosis Date Noted  . [place] GLAND 02/19/2009  . CARBUNCLE/FURUNCLE NOS 04/26/2006  . SKIN RASH 11/13/2008  . SYMPTOM, FREQUENCY, URINARY 04/26/2006  . Abdominal pain, epigastric 04/27/2010  . Impaired fasting glucose 04/27/2010  . 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 VAGINAL DISCHARGE 08/10/2010  . FOLLICULITIS 96/28/3662  . SORE THROAT 01/10/2011  . ABDOMINAL PAIN 01/10/2011  . Hyperlipidemia 10/14/2012  . Obesity, unspecified 10/14/2012  . Other iron deficiency anemias 03/17/2013  . Elevated hemoglobin A1c  05/11/2014  . Abscess of left axilla 12/31/2014  . Bilateral lower abdominal pain 09/07/2015  . Ruptured ear drum, right 08/29/2017   Past Medical History:  Diagnosis Date  . Hypercholesterolemia   . Hypertension   . IBS (irritable bowel syndrome)   . Other specified iron deficiency anemias 03/17/2013      Review of Systems See HPI.     Objective:   Physical Exam Vitals signs reviewed.  Constitutional:      Appearance: Normal appearance.  HENT:     Head: Normocephalic and atraumatic.     Right Ear: Tympanic membrane and ear canal normal.     Left Ear: Tympanic membrane and ear canal normal.     Nose: Congestion present.     Mouth/Throat:     Mouth: Mucous membranes are moist.  Eyes:     Extraocular Movements: Extraocular movements intact.     Conjunctiva/sclera: Conjunctivae normal.     Pupils: Pupils are equal, round, and reactive to light.  Cardiovascular:     Rate and Rhythm: Normal rate and regular rhythm.  Pulmonary:     Effort: Pulmonary effort is normal.     Breath sounds: Normal breath sounds.  Skin:    Comments: Right axilla 2.5cm by 2cm firm, tender, slightly warm to the touch seb cyst.   Neurological:     General: No focal deficit present.     Mental Status: She is alert and oriented to person, place, and time.  Psychiatric:        Mood and Affect: Mood normal.  Assessment & Plan:  Marland KitchenMarland KitchenAleesa was seen today for cyst.  Diagnoses and all orders for this visit:  Infected sebaceous cyst -     clindamycin (CLEOCIN) 300 MG capsule; Take 1 capsule (300 mg total) by mouth 3 (three) times daily. -     HYDROcodone-acetaminophen (NORCO/VICODIN) 5-325 MG tablet; Take 1 tablet by mouth every 8 (eight) hours as needed for up to 5 days for moderate pain.  Viral upper respiratory tract infection   Discussed symptomatic care for URI. Follow up as needed.   Sebaceous Cyst Excision Procedure Note  Pre-operative Diagnosis: Epidermal  cyst  Post-operative Diagnosis: same  Locations:right axilla  Indications: painful  Anesthesia: Lidocaine 1% without epinephrine without added sodium bicarbonate  Procedure Details  History of allergy to iodine: no  Patient informed of the risks (including bleeding and infection) and benefits of the  procedure and Verbal informed consent obtained.  The lesion and surrounding area was given a sterile prep using chlorhexidine and draped in the usual sterile fashion. An incision was made over the cyst, which was dissected free of the surrounding tissue and removed.  The cyst was filled with typical sebaceous material.  The wound was left open and packed with antibacteral Iodoform due to its infected nature. Antibiotic ointment and a sterile dressing applied.  The specimen was not sent for pathologic examination. The patient tolerated the procedure well.  EBL: 1 ml  Findings: infected sebaceous material  Condition: Stable  Complications: pain.  Plan: 1. Instructed to keep the wound dry and covered for 24-48h and clean thereafter. 2. Warning signs of infection were reviewed.   3. Recommended that the patient use Vicodin as needed for pain.  4. Return for follow up in 7 days.  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

## 2018-06-27 ENCOUNTER — Encounter: Payer: Self-pay | Admitting: Physician Assistant

## 2018-06-28 ENCOUNTER — Encounter: Payer: Self-pay | Admitting: Physician Assistant

## 2018-06-28 ENCOUNTER — Ambulatory Visit (INDEPENDENT_AMBULATORY_CARE_PROVIDER_SITE_OTHER): Payer: Commercial Managed Care - PPO | Admitting: Physician Assistant

## 2018-06-28 VITALS — BP 108/67 | HR 87 | Ht 64.0 in | Wt 214.0 lb

## 2018-06-28 DIAGNOSIS — L723 Sebaceous cyst: Secondary | ICD-10-CM

## 2018-06-28 DIAGNOSIS — E1165 Type 2 diabetes mellitus with hyperglycemia: Secondary | ICD-10-CM | POA: Diagnosis not present

## 2018-06-28 DIAGNOSIS — L089 Local infection of the skin and subcutaneous tissue, unspecified: Secondary | ICD-10-CM

## 2018-06-28 LAB — POCT GLYCOSYLATED HEMOGLOBIN (HGB A1C): HEMOGLOBIN A1C: 6.9 % — AB (ref 4.0–5.6)

## 2018-06-28 MED ORDER — EMPAGLIFLOZIN 10 MG PO TABS: 10.0000 mg | ORAL_TABLET | Freq: Every day | ORAL | 1 refills | Status: DC

## 2018-06-28 NOTE — Progress Notes (Signed)
l °

## 2018-06-30 ENCOUNTER — Encounter: Payer: Self-pay | Admitting: Physician Assistant

## 2018-06-30 MED ORDER — SEMAGLUTIDE(0.25 OR 0.5MG/DOS) 2 MG/1.5ML ~~LOC~~ SOPN: 0.5000 mg | PEN_INJECTOR | SUBCUTANEOUS | 2 refills | Status: DC

## 2018-06-30 NOTE — Progress Notes (Signed)
Subjective:    Patient ID: Stacie Cardenas, female    DOB: [birth year], 38 y.o.   MRN: [id number]  HPI Pt is a 38 yo female who presents to the clinic to follow up after I/D of infected seb cyst of right axilla and T2DM.   Pt is doing well. No pain. I/D area did close without issue. She has derm appt for feb.   DM- not checking sugars. Only taking jardiance. No open sores or wounds. She would like to lose weight.   .. Active Ambulatory Problems    Diagnosis Date Noted  . HYPERLIPIDEMIA 04/27/2010  . Iron deficiency anemia 01/21/2010  . HYPERTENSION, BENIGN ESSENTIAL 04/26/2006  . WEIGHT GAIN 11/13/2008  . MIGRAINE HEADACHE 06/28/2010  . Menorrhagia 07/03/2013  . Crohn's disease ([place]) 03/30/2014  . Type 2 diabetes mellitus, controlled ([place]) 06/08/2014  . Insomnia 06/20/2015  . Absolute anemia 09/07/2015  . Vitamin D deficiency 10/03/2016  . Morbidly obese ([place]) 10/20/2016  . Chronic fatigue syndrome 02/18/2017  . Chronic left-sided low back pain without sciatica 07/08/2017  . Anxiety 07/08/2017  . Hidradenitis suppurativa 11/09/2017  . Hyperpigmentation of skin, postinflammatory 11/09/2017  . Acanthosis nigricans 11/09/2017  . Dyslipidemia (high LDL; low HDL) 03/20/2018   Resolved Ambulatory Problems    Diagnosis Date Noted  . [place] GLAND 02/19/2009  . CARBUNCLE/FURUNCLE NOS 04/26/2006  . SKIN RASH 11/13/2008  . SYMPTOM, FREQUENCY, URINARY 04/26/2006  . Abdominal pain, epigastric 04/27/2010  . Impaired fasting glucose 04/27/2010  . 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 VAGINAL DISCHARGE 08/10/2010  . FOLLICULITIS 65/46/5035  . SORE THROAT 01/10/2011  . ABDOMINAL PAIN 01/10/2011  . Hyperlipidemia 10/14/2012  . Obesity, unspecified 10/14/2012  . Other iron deficiency anemias 03/17/2013  . Elevated hemoglobin A1c 05/11/2014  . Abscess of left axilla 12/31/2014  . Bilateral lower abdominal pain 09/07/2015  . Ruptured ear drum,  right 08/29/2017   Past Medical History:  Diagnosis Date  . Hypercholesterolemia   . Hypertension   . IBS (irritable bowel syndrome)   . Other specified iron deficiency anemias 03/17/2013      Review of Systems See HPI.     Objective:   Physical Exam Vitals signs reviewed.  Constitutional:      Appearance: Normal appearance.  HENT:     Head: Normocephalic and atraumatic.  Cardiovascular:     Rate and Rhythm: Normal rate and regular rhythm.  Pulmonary:     Effort: Pulmonary effort is normal.     Breath sounds: Normal breath sounds.  Skin:    Comments: Well healed I and D. Still a firm nodule but not painful in right axilla.   Neurological:     General: No focal deficit present.     Mental Status: She is alert and oriented to person, place, and time.  Psychiatric:        Mood and Affect: Mood normal.        Behavior: Behavior normal.           Assessment & Plan:  [name] was seen today for follow-up.  Diagnoses and all orders for this visit:  Infected sebaceous cyst  Uncontrolled type 2 diabetes mellitus with hyperglycemia ([place]) -     POCT glycosylated hemoglobin (Hb A1C) -     empagliflozin (JARDIANCE) 10 MG TABS tablet; Take 10 mg by mouth daily. -     Semaglutide,0.25 or 0.5MG/DOS, (OZEMPIC, 0.25 OR 0.5 MG/DOSE,) 2 MG/1.5ML SOPN; Inject 0.5 mg into the  skin once a week.   no signs of infection over wound today. I do think derm needs to completely excise cyst so that it doesn't come back. She has referral.   .. Results for orders placed or performed in visit on 06/28/18  POCT glycosylated hemoglobin (Hb A1C)  Result Value Ref Range   Hemoglobin A1C 6.9 (A) 4.0 - 5.6 %   HbA1c POC (<> result, manual entry)     HbA1c, POC (prediabetic range)     HbA1c, POC (controlled diabetic range)     Added ozempic to better a1c control. Discussed side effects.  Discussed side effects.  On ace.  On statin.  Vaccines up to date.  Follow up in 3 months.

## 2018-07-01 ENCOUNTER — Telehealth: Payer: Self-pay | Admitting: Sports Medicine

## 2018-07-01 NOTE — Telephone Encounter (Signed)
Received a fax from [facility] that [place] has been approved from 07/01/2018 through 07/02/2019. Pharmacy aware and forms sent to scan.

## 2018-07-05 DIAGNOSIS — Z79899 Other long term (current) drug therapy: Secondary | ICD-10-CM | POA: Insufficient documentation

## 2018-07-10 ENCOUNTER — Telehealth: Payer: Self-pay

## 2018-07-10 NOTE — Telephone Encounter (Signed)
Patient called today, and said she is traveling out of town next week, and is in need of a note for her job that states that she needs a refrigerator in her hotel room due to having medications that require refrigeration along with having diabetes and she needs a place to put her snacks. PCP approved note, note signed, and has been placed at the front desk for pick-up. No further questions or concerns at this time.

## 2018-09-10 ENCOUNTER — Ambulatory Visit (INDEPENDENT_AMBULATORY_CARE_PROVIDER_SITE_OTHER): Payer: Commercial Managed Care - PPO | Admitting: Physician Assistant

## 2018-09-10 ENCOUNTER — Encounter: Payer: Self-pay | Admitting: Physician Assistant

## 2018-09-10 VITALS — BP 128/88 | Temp 97.5°F | Wt 215.0 lb

## 2018-09-10 DIAGNOSIS — R110 Nausea: Secondary | ICD-10-CM

## 2018-09-10 DIAGNOSIS — R8290 Unspecified abnormal findings in urine: Secondary | ICD-10-CM | POA: Diagnosis not present

## 2018-09-10 DIAGNOSIS — R350 Frequency of micturition: Secondary | ICD-10-CM

## 2018-09-10 MED ORDER — NITROFURANTOIN MONOHYD MACRO 100 MG PO CAPS: 100.0000 mg | ORAL_CAPSULE | Freq: Two times a day (BID) | ORAL | 0 refills | Status: DC

## 2018-09-10 MED ORDER — ONDANSETRON HCL 8 MG PO TABS: 8.0000 mg | ORAL_TABLET | Freq: Three times a day (TID) | ORAL | 0 refills | Status: DC | PRN

## 2018-09-10 NOTE — Progress Notes (Deleted)
   Subjective:    Patient ID: ARETA TERWILLIGER, female    DOB: 10-09-1980, 38 y.o.   MRN: [id number]  HPI  chrons Urine odor freq No abd  No abd pain     Review of Systems     Objective:   Physical Exam        Assessment & Plan:  Marland KitchenMarland KitchenDiagnoses and all orders for this visit:  Abnormal urine odor -     nitrofurantoin, macrocrystal-monohydrate, (MACROBID) 100 MG capsule; Take 1 capsule (100 mg total) by mouth 2 (two) times daily. For 7 days.  Urinary frequency -     nitrofurantoin, macrocrystal-monohydrate, (MACROBID) 100 MG capsule; Take 1 capsule (100 mg total) by mouth 2 (two) times daily. For 7 days.  Nausea -     nitrofurantoin, macrocrystal-monohydrate, (MACROBID) 100 MG capsule; Take 1 capsule (100 mg total) by mouth 2 (two) times daily. For 7 days. -     ondansetron (ZOFRAN) 8 MG tablet; Take 1 tablet (8 mg total) by mouth every 8 (eight) hours as needed for nausea or vomiting.

## 2018-09-10 NOTE — Progress Notes (Signed)
Patient ID: Stacie Cardenas, female   DOB: June 16, 1980, 38 y.o.   MRN: [id number] .Marland KitchenVirtual Visit via Telephone Note  I connected with CASHMERE DINGLEY on 09/10/18 at  2:20 PM EDT by telephone and verified that I am speaking with the correct person using two identifiers.   I discussed the limitations, risks, security and privacy concerns of performing an evaluation and management service by telephone and the availability of in person appointments. I also discussed with the patient that there may be a patient responsible charge related to this service. The patient expressed understanding and agreed to proceed.   History of Present Illness: Pt is a 38 yo female with chron's disease and T2DM who calls into the clinic with UTI symptoms. She has had urinary frequency and odor for 2 days. She feels a lot of lower abdominal pressure. She denies fever, chills, flank pain or abdominal pain. She denies any vaginal discharge or itching. She has not tried anything to make better. She is nauseated but that has been since starting ozempic. She does feel like it is getting some better but would like rx for zofran.   .. Active Ambulatory Problems    Diagnosis Date Noted  . HYPERLIPIDEMIA 04/27/2010  . Iron deficiency anemia 01/21/2010  . HYPERTENSION, BENIGN ESSENTIAL 04/26/2006  . WEIGHT GAIN 11/13/2008  . MIGRAINE HEADACHE 06/28/2010  . Menorrhagia 07/03/2013  . Crohn's disease ([place]) 03/30/2014  . Type 2 diabetes mellitus, controlled ([place]) 06/08/2014  . Insomnia 06/20/2015  . Absolute anemia 09/07/2015  . Vitamin D deficiency 10/03/2016  . Morbidly obese ([place]) 10/20/2016  . Chronic fatigue syndrome 02/18/2017  . Chronic left-sided low back pain without sciatica 07/08/2017  . Anxiety 07/08/2017  . Hidradenitis suppurativa 11/09/2017  . Hyperpigmentation of skin, postinflammatory 11/09/2017  . Acanthosis nigricans 11/09/2017  . Dyslipidemia (high LDL; low HDL) 03/20/2018   Resolved Ambulatory Problems   Diagnosis Date Noted  . [place] GLAND 02/19/2009  . CARBUNCLE/FURUNCLE NOS 04/26/2006  . SKIN RASH 11/13/2008  . SYMPTOM, FREQUENCY, URINARY 04/26/2006  . Abdominal pain, epigastric 04/27/2010  . Impaired fasting glucose 04/27/2010  . 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 VAGINAL DISCHARGE 08/10/2010  . FOLLICULITIS 44/96/7591  . SORE THROAT 01/10/2011  . ABDOMINAL PAIN 01/10/2011  . Hyperlipidemia 10/14/2012  . Obesity, unspecified 10/14/2012  . Other iron deficiency anemias 03/17/2013  . Elevated hemoglobin A1c 05/11/2014  . Abscess of left axilla 12/31/2014  . Bilateral lower abdominal pain 09/07/2015  . Ruptured ear drum, right 08/29/2017   Past Medical History:  Diagnosis Date  . Hypercholesterolemia   . Hypertension   . IBS (irritable bowel syndrome)   . Other specified iron deficiency anemias 03/17/2013   Reviewed med, allergy, problem list.    Observations/Objective: No acute distress  .Marland Kitchen Today's Vitals   09/10/18 1407  BP: 128/88  Temp: (!) 97.5 F (36.4 C)  TempSrc: Oral  Weight: 215 lb (97.5 kg)   Body mass index is 36.9 kg/m. Marland Kitchen   Assessment and Plan: Marland KitchenMarland KitchenDiagnoses and all orders for this visit:  Abnormal urine odor -     nitrofurantoin, macrocrystal-monohydrate, (MACROBID) 100 MG capsule; Take 1 capsule (100 mg total) by mouth 2 (two) times daily. For 7 days.  Urinary frequency -     nitrofurantoin, macrocrystal-monohydrate, (MACROBID) 100 MG capsule; Take 1 capsule (100 mg total) by mouth 2 (two) times daily. For 7 days.  Nausea -     nitrofurantoin, macrocrystal-monohydrate, (MACROBID) 100 MG capsule;  Take 1 capsule (100 mg total) by mouth 2 (two) times daily. For 7 days. -     ondansetron (ZOFRAN) 8 MG tablet; Take 1 tablet (8 mg total) by mouth every 8 (eight) hours as needed for nausea or vomiting.   Virtual visit no way to do labs. Will treat for UTI. Discussed if not improving after abx follow up in  clinic for urine culture. zofran for nausea. Stay hydrated.   Follow Up Instructions:    I discussed the assessment and treatment plan with the patient. The patient was provided an opportunity to ask questions and all were answered. The patient agreed with the plan and demonstrated an understanding of the instructions.   The patient was advised to call back or seek an in-person evaluation if the symptoms worsen or if the condition fails to improve as anticipated.  I provided 9 minutes of non-face-to-face time during this encounter.   [name], PA-C

## 2018-10-04 ENCOUNTER — Ambulatory Visit (INDEPENDENT_AMBULATORY_CARE_PROVIDER_SITE_OTHER): Payer: Commercial Managed Care - PPO | Admitting: Physician Assistant

## 2018-10-04 ENCOUNTER — Encounter: Payer: Self-pay | Admitting: Physician Assistant

## 2018-10-04 VITALS — BP 111/73 | HR 94 | Temp 98.1°F | Ht 64.0 in | Wt 210.0 lb

## 2018-10-04 DIAGNOSIS — N3001 Acute cystitis with hematuria: Secondary | ICD-10-CM | POA: Diagnosis not present

## 2018-10-04 DIAGNOSIS — R8290 Unspecified abnormal findings in urine: Secondary | ICD-10-CM | POA: Diagnosis not present

## 2018-10-04 LAB — POCT URINALYSIS DIPSTICK
Bilirubin, UA: NEGATIVE
Glucose, UA: POSITIVE — AB
Ketones, UA: NEGATIVE
Leukocytes, UA: NEGATIVE
Nitrite, UA: POSITIVE
Protein, UA: NEGATIVE
Spec Grav, UA: 1.01 (ref 1.010–1.025)
Urobilinogen, UA: 0.2 E.U./dL
pH, UA: 7 (ref 5.0–8.0)

## 2018-10-04 MED ORDER — FLUCONAZOLE 150 MG PO TABS: 150.0000 mg | ORAL_TABLET | Freq: Once | ORAL | 0 refills | Status: AC

## 2018-10-04 MED ORDER — METRONIDAZOLE 500 MG PO TABS: 500.0000 mg | ORAL_TABLET | Freq: Two times a day (BID) | ORAL | 0 refills | Status: DC

## 2018-10-04 MED ORDER — CIPROFLOXACIN HCL 500 MG PO TABS: 500.0000 mg | ORAL_TABLET | Freq: Two times a day (BID) | ORAL | 0 refills | Status: DC

## 2018-10-04 NOTE — Progress Notes (Signed)
Subjective:    Patient ID: Stacie Cardenas, female    DOB: Dec 02, 1980, 38 y.o.   MRN: [id number]  HPI  Pt is a 38 yo female with T2DM, Chrons disease who presents to the clinic for abnormal urine odor. She was treated with macrobid for UTI on 09/10/18. Her symptoms resolved but a few days ago started back. No fever, chills, vaginal discharge, itching, headache, flank pain, nausea or vomiting. She does have a hx of BV and yeast after abx.   .. Active Ambulatory Problems    Diagnosis Date Noted  . HYPERLIPIDEMIA 04/27/2010  . Iron deficiency anemia 01/21/2010  . HYPERTENSION, BENIGN ESSENTIAL 04/26/2006  . WEIGHT GAIN 11/13/2008  . MIGRAINE HEADACHE 06/28/2010  . Menorrhagia 07/03/2013  . Crohn's disease ([place]) 03/30/2014  . Type 2 diabetes mellitus, controlled ([place]) 06/08/2014  . Insomnia 06/20/2015  . Absolute anemia 09/07/2015  . Vitamin D deficiency 10/03/2016  . Morbidly obese ([place]) 10/20/2016  . Chronic fatigue syndrome 02/18/2017  . Chronic left-sided low back pain without sciatica 07/08/2017  . Anxiety 07/08/2017  . Hidradenitis suppurativa 11/09/2017  . Hyperpigmentation of skin, postinflammatory 11/09/2017  . Acanthosis nigricans 11/09/2017  . Dyslipidemia (high LDL; low HDL) 03/20/2018   Resolved Ambulatory Problems    Diagnosis Date Noted  . [place] GLAND 02/19/2009  . CARBUNCLE/FURUNCLE NOS 04/26/2006  . SKIN RASH 11/13/2008  . SYMPTOM, FREQUENCY, URINARY 04/26/2006  . Abdominal pain, epigastric 04/27/2010  . Impaired fasting glucose 04/27/2010  . 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 VAGINAL DISCHARGE 08/10/2010  . FOLLICULITIS 11/91/4782  . SORE THROAT 01/10/2011  . ABDOMINAL PAIN 01/10/2011  . Hyperlipidemia 10/14/2012  . Obesity, unspecified 10/14/2012  . Other iron deficiency anemias 03/17/2013  . Elevated hemoglobin A1c 05/11/2014  . Abscess of left axilla 12/31/2014  . Bilateral lower abdominal pain 09/07/2015  .  Ruptured ear drum, right 08/29/2017   Past Medical History:  Diagnosis Date  . Hypercholesterolemia   . Hypertension   . IBS (irritable bowel syndrome)   . Other specified iron deficiency anemias 03/17/2013      Review of Systems See HPI.     Objective:   Physical Exam Vitals signs reviewed.  Constitutional:      Appearance: Normal appearance.  HENT:     Head: Normocephalic and atraumatic.  Cardiovascular:     Rate and Rhythm: Normal rate and regular rhythm.     Pulses: Normal pulses.  Pulmonary:     Effort: Pulmonary effort is normal.     Breath sounds: Normal breath sounds.  Chest:     Chest wall: No tenderness.  Abdominal:     General: Abdomen is flat. Bowel sounds are normal.     Palpations: Abdomen is soft.     Tenderness: There is no abdominal tenderness. There is no right CVA tenderness or left CVA tenderness.  Neurological:     General: No focal deficit present.     Mental Status: She is alert and oriented to person, place, and time.  Psychiatric:        Mood and Affect: Mood normal.        Behavior: Behavior normal.           Assessment & Plan:  Marland KitchenMarland KitchenKarson was seen today for urinary tract infection.  Diagnoses and all orders for this visit:  Acute cystitis with hematuria -     ciprofloxacin (CIPRO) 500 MG tablet; Take 1 tablet (500 mg total) by mouth 2 (two)  times daily. -     Urine Culture  Abnormal urine odor -     POCT urinalysis dipstick -     Urine Culture -     metroNIDAZOLE (FLAGYL) 500 MG tablet; Take 1 tablet (500 mg total) by mouth 2 (two) times daily. -     Urine Culture  Other orders -     fluconazole (DIFLUCAN) 150 MG tablet; Take 1 tablet (150 mg total) by mouth once for 1 dose. Repeat in 48 to 72 hours if symptoms persist.    . Results for orders placed or performed in visit on 10/04/18  Urine Culture  Result Value Ref Range   MICRO NUMBER: [id number]    SPECIMEN QUALITY: Adequate    Sample Source URINE    STATUS: FINAL     ISOLATE 1: Escherichia coli (A)       Susceptibility   Escherichia coli - URINE CULTURE, REFLEX    AMOX/CLAVULANIC <=2 Sensitive     AMPICILLIN 4 Sensitive     AMPICILLIN/SULBACTAM <=2 Sensitive     CEFAZOLIN* <=4 Not Reportable      * For infections other than uncomplicated UTIcaused by E. coli, K. pneumoniae or P. mirabilis:Cefazolin is resistant if MIC > or = 8 mcg/mL.(Distinguishing susceptible versus intermediatefor isolates with MIC < or = 4 mcg/mL requiresadditional testing.)For uncomplicated UTI caused by E. coli,K. pneumoniae or P. mirabilis: Cefazolin issusceptible if MIC <32 mcg/mL and predictssusceptible to the oral agents cefaclor, cefdinir,cefpodoxime, cefprozil, cefuroxime, cephalexinand loracarbef.    CEFEPIME <=1 Sensitive     CEFTRIAXONE <=1 Sensitive     CIPROFLOXACIN <=0.25 Sensitive     LEVOFLOXACIN <=0.12 Sensitive     ERTAPENEM <=0.5 Sensitive     GENTAMICIN <=1 Sensitive     IMIPENEM <=0.25 Sensitive     NITROFURANTOIN <=16 Sensitive     PIP/TAZO <=4 Sensitive     TOBRAMYCIN <=1 Sensitive     TRIMETH/SULFA* <=20 Sensitive      * For infections other than uncomplicated UTIcaused by E. coli, K. pneumoniae or P. mirabilis:Cefazolin is resistant if MIC > or = 8 mcg/mL.(Distinguishing susceptible versus intermediatefor isolates with MIC < or = 4 mcg/mL requiresadditional testing.)For uncomplicated UTI caused by E. coli,K. pneumoniae or P. mirabilis: Cefazolin issusceptible if MIC <32 mcg/mL and predictssusceptible to the oral agents cefaclor, cefdinir,cefpodoxime, cefprozil, cefuroxime, cephalexinand loracarbef.Legend:S = Susceptible  I = IntermediateR = Resistant  NS = Not susceptible* = Not tested  NR = Not reported**NN = See antimicrobic comments  POCT urinalysis dipstick  Result Value Ref Range   Color, UA yellow    Clarity, UA clear    Glucose, UA Positive (A) Negative   Bilirubin, UA negative    Ketones, UA negative    Spec Grav, UA 1.010 1.010 - 1.025   Blood, UA  small    pH, UA 7.0 5.0 - 8.0   Protein, UA Negative Negative   Urobilinogen, UA 0.2 0.2 or 1.0 E.U./dL   Nitrite, UA pos    Leukocytes, UA Negative Negative   Appearance     Odor small    Positive for nitrates. Treated with cipro for 10 days. Hx of BV and with odor will treat with metronidazole. Diflucan given for yeast after abx treatment. Printed order for culture to have done after completion of abx since she had 2 suspected infections so close together.

## 2018-10-06 LAB — URINE CULTURE
MICRO NUMBER:: 420194
SPECIMEN QUALITY:: ADEQUATE

## 2018-10-06 NOTE — Progress Notes (Signed)
E.coli detected. Should respond to cipro treatment.

## 2018-10-16 ENCOUNTER — Other Ambulatory Visit: Payer: Self-pay | Admitting: Physician Assistant

## 2018-10-16 DIAGNOSIS — E1165 Type 2 diabetes mellitus with hyperglycemia: Secondary | ICD-10-CM

## 2018-10-17 LAB — URINE CULTURE
MICRO NUMBER:: 450920
Result:: NO GROWTH
SPECIMEN QUALITY:: ADEQUATE

## 2018-10-17 LAB — HEMOGLOBIN A1C
Hgb A1c MFr Bld: 6 % of total Hgb — ABNORMAL HIGH (ref <5.7)
Mean Plasma Glucose: 126 (calc)
eAG (mmol/L): 7 (calc)

## 2018-10-18 ENCOUNTER — Encounter: Payer: Self-pay | Admitting: Physician Assistant

## 2018-10-18 ENCOUNTER — Ambulatory Visit (INDEPENDENT_AMBULATORY_CARE_PROVIDER_SITE_OTHER): Payer: Commercial Managed Care - PPO | Admitting: Physician Assistant

## 2018-10-18 DIAGNOSIS — N3001 Acute cystitis with hematuria: Secondary | ICD-10-CM

## 2018-10-18 DIAGNOSIS — E1169 Type 2 diabetes mellitus with other specified complication: Secondary | ICD-10-CM | POA: Diagnosis not present

## 2018-10-18 MED ORDER — SEMAGLUTIDE(0.25 OR 0.5MG/DOS) 2 MG/1.5ML ~~LOC~~ SOPN: 0.5000 mg | PEN_INJECTOR | SUBCUTANEOUS | 2 refills | Status: DC

## 2018-10-18 NOTE — Progress Notes (Signed)
Patient ID: Stacie Cardenas, female   DOB: May 22, 1981, 38 y.o.   MRN: [id number] .Marland KitchenVirtual Visit via Telephone Note  I connected with Stacie Cardenas on 10/18/18 at  9:30 AM EDT by telephone and verified that I am speaking with the correct person using two identifiers.  Location: Patient: home Provider: clinic   I discussed the limitations, risks, security and privacy concerns of performing an evaluation and management service by telephone and the availability of in person appointments. I also discussed with the patient that there may be a patient responsible charge related to this service. The patient expressed understanding and agreed to proceed.   History of Present Illness: Pt is a 38 yo obese female with T2DM, chrons disease who calls into the clinic for 3 month follow up.   She has been having frequent UTI's. She finished her antibiotic on Wednesday and wanted to make sure infection had cleared. No symptoms.   DM- not checking sugars. Taking jardiance and ozempic. Doing better with nausea. No hypoglycemic events. No open sores or wounds. She is doing better with diet and lost 5lbs in last 2 months.   .. Active Ambulatory Problems    Diagnosis Date Noted  . HYPERLIPIDEMIA 04/27/2010  . Iron deficiency anemia 01/21/2010  . HYPERTENSION, BENIGN ESSENTIAL 04/26/2006  . WEIGHT GAIN 11/13/2008  . MIGRAINE HEADACHE 06/28/2010  . Menorrhagia 07/03/2013  . Crohn's disease ([place]) 03/30/2014  . Type 2 diabetes mellitus, controlled ([place]) 06/08/2014  . Insomnia 06/20/2015  . Absolute anemia 09/07/2015  . Vitamin D deficiency 10/03/2016  . Morbidly obese ([place]) 10/20/2016  . Chronic fatigue syndrome 02/18/2017  . Chronic left-sided low back pain without sciatica 07/08/2017  . Anxiety 07/08/2017  . Hidradenitis suppurativa 11/09/2017  . Hyperpigmentation of skin, postinflammatory 11/09/2017  . Acanthosis nigricans 11/09/2017  . Dyslipidemia (high LDL; low HDL) 03/20/2018   Resolved  Ambulatory Problems    Diagnosis Date Noted  . [place] GLAND 02/19/2009  . CARBUNCLE/FURUNCLE NOS 04/26/2006  . SKIN RASH 11/13/2008  . SYMPTOM, FREQUENCY, URINARY 04/26/2006  . Abdominal pain, epigastric 04/27/2010  . Impaired fasting glucose 04/27/2010  . 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 VAGINAL DISCHARGE 08/10/2010  . FOLLICULITIS 63/33/5456  . SORE THROAT 01/10/2011  . ABDOMINAL PAIN 01/10/2011  . Hyperlipidemia 10/14/2012  . Obesity, unspecified 10/14/2012  . Other iron deficiency anemias 03/17/2013  . Elevated hemoglobin A1c 05/11/2014  . Abscess of left axilla 12/31/2014  . Bilateral lower abdominal pain 09/07/2015  . Ruptured ear drum, right 08/29/2017   Past Medical History:  Diagnosis Date  . Hypercholesterolemia   . Hypertension   . IBS (irritable bowel syndrome)   . Other specified iron deficiency anemias 03/17/2013   reviewed med, allergy, problem list.     Observations/Objective: No acute distress. Normal mood.   No vitals obtained.   Assessment and Plan: Marland KitchenMarland KitchenDiagnoses and all orders for this visit:  Controlled type 2 diabetes mellitus with other specified complication, without long-term current use of insulin ([place]) -     Semaglutide,0.25 or 0.5MG/DOS, (OZEMPIC, 0.25 OR 0.5 MG/DOSE,) 2 MG/1.5ML SOPN; Inject 0.5 mg into the skin once a week.  Acute cystitis with hematuria  A!C was checked and went down to 6.0. great job.  Pt is tolerating ozempic a lot better. Has zofran if needed for nausea. She does notice helping with appetite and weight loss.  Consider increasing ozempic for weight loss purposes. Pt declines.  Continue jardiance.  On ArB. On  STATIN.  Vaccines up to date.  Follow up in 3 months.   Urine culture showed no bacterial growth. She has completely cleared for UTI.   Follow Up Instructions:    I discussed the assessment and treatment plan with the patient. The patient was provided an  opportunity to ask questions and all were answered. The patient agreed with the plan and demonstrated an understanding of the instructions.   The patient was advised to call back or seek an in-person evaluation if the symptoms worsen or if the condition fails to improve as anticipated.  I provided 15 minutes of non-face-to-face time during this encounter.   [name], PA-C

## 2018-10-18 NOTE — Progress Notes (Deleted)
   Subjective:    Patient ID: Stacie Cardenas, female    DOB: 1981-04-21, 38 y.o.   MRN: [id number]  HPI ozempic tolerating pretty well.  jardianc    Review of Systems     Objective:   Physical Exam        Assessment & Plan:

## 2018-12-02 ENCOUNTER — Telehealth: Payer: Self-pay

## 2018-12-02 MED ORDER — FLUCONAZOLE 150 MG PO TABS: 150.0000 mg | ORAL_TABLET | Freq: Once | ORAL | 0 refills | Status: DC

## 2018-12-02 NOTE — Telephone Encounter (Signed)
Stacie Cardenas called and complains of vaginal itching for 1 day. She has not tried any over the counter medications. Denies fever, chills, sweats, vaginal discharge or pelvic pain. She would really like a prescription sent to [facility] on [address].

## 2018-12-02 NOTE — Telephone Encounter (Signed)
Sent diflucan

## 2018-12-03 NOTE — Telephone Encounter (Signed)
Pt advised.

## 2018-12-22 ENCOUNTER — Other Ambulatory Visit: Payer: Self-pay | Admitting: Physician Assistant

## 2018-12-22 DIAGNOSIS — I10 Essential (primary) hypertension: Secondary | ICD-10-CM

## 2018-12-27 ENCOUNTER — Ambulatory Visit (INDEPENDENT_AMBULATORY_CARE_PROVIDER_SITE_OTHER): Payer: Commercial Managed Care - PPO | Admitting: Physician Assistant

## 2018-12-27 ENCOUNTER — Encounter: Payer: Self-pay | Admitting: Physician Assistant

## 2018-12-27 ENCOUNTER — Other Ambulatory Visit (HOSPITAL_COMMUNITY)
Admission: RE | Admit: 2018-12-27 | Discharge: 2018-12-27 | Disposition: A | Discharge status: Home / Self Care | Payer: Commercial Managed Care - PPO | Source: Ambulatory Visit | Attending: Physician Assistant | Admitting: Physician Assistant

## 2018-12-27 ENCOUNTER — Other Ambulatory Visit: Payer: Self-pay

## 2018-12-27 VITALS — BP 118/70 | HR 95 | Ht 64.0 in | Wt 207.0 lb

## 2018-12-27 DIAGNOSIS — R5383 Other fatigue: Secondary | ICD-10-CM | POA: Diagnosis not present

## 2018-12-27 DIAGNOSIS — Z124 Encounter for screening for malignant neoplasm of cervix: Secondary | ICD-10-CM | POA: Insufficient documentation

## 2018-12-27 DIAGNOSIS — E782 Mixed hyperlipidemia: Secondary | ICD-10-CM

## 2018-12-27 DIAGNOSIS — N644 Mastodynia: Secondary | ICD-10-CM

## 2018-12-27 DIAGNOSIS — Z0000 Encounter for general adult medical examination without abnormal findings: Secondary | ICD-10-CM | POA: Diagnosis not present

## 2018-12-27 DIAGNOSIS — E1169 Type 2 diabetes mellitus with other specified complication: Secondary | ICD-10-CM | POA: Diagnosis not present

## 2018-12-27 NOTE — Patient Instructions (Signed)
Health Maintenance, Female Adopting a healthy lifestyle and getting preventive care are important in promoting health and wellness. Ask your health care provider about:  The right schedule for you to have regular tests and exams.  Things you can do on your own to prevent diseases and keep yourself healthy. What should I know about diet, weight, and exercise? Eat a healthy diet   Eat a diet that includes plenty of vegetables, fruits, low-fat dairy products, and lean protein.  Do not eat a lot of foods that are high in solid fats, added sugars, or sodium. Maintain a healthy weight Body mass index (BMI) is used to identify weight problems. It estimates body fat based on height and weight. Your health care provider can help determine your BMI and help you achieve or maintain a healthy weight. Get regular exercise Get regular exercise. This is one of the most important things you can do for your health. Most adults should:  Exercise for at least 150 minutes each week. The exercise should increase your heart rate and make you sweat (moderate-intensity exercise).  Do strengthening exercises at least twice a week. This is in addition to the moderate-intensity exercise.  Spend less time sitting. Even light physical activity can be beneficial. Watch cholesterol and blood lipids Have your blood tested for lipids and cholesterol at 38 years of age, then have this test every 5 years. Have your cholesterol levels checked more often if:  Your lipid or cholesterol levels are high.  You are older than 38 years of age.  You are at high risk for heart disease. What should I know about cancer screening? Depending on your health history and family history, you may need to have cancer screening at various ages. This may include screening for:  Breast cancer.  Cervical cancer.  Colorectal cancer.  Skin cancer.  Lung cancer. What should I know about heart disease, diabetes, and high blood  pressure? Blood pressure and heart disease  High blood pressure causes heart disease and increases the risk of stroke. This is more likely to develop in people who have high blood pressure readings, are of African descent, or are overweight.  Have your blood pressure checked: ? Every 3-5 years if you are 18-39 years of age. ? Every year if you are 40 years old or older. Diabetes Have regular diabetes screenings. This checks your fasting blood sugar level. Have the screening done:  Once every three years after age 40 if you are at a normal weight and have a low risk for diabetes.  More often and at a younger age if you are overweight or have a high risk for diabetes. What should I know about preventing infection? Hepatitis B If you have a higher risk for hepatitis B, you should be screened for this virus. Talk with your health care provider to find out if you are at risk for hepatitis B infection. Hepatitis C Testing is recommended for:  Everyone born from 1945 through 1965.  Anyone with known risk factors for hepatitis C. Sexually transmitted infections (STIs)  Get screened for STIs, including gonorrhea and chlamydia, if: ? You are sexually active and are younger than 38 years of age. ? You are older than 38 years of age and your health care provider tells you that you are at risk for this type of infection. ? Your sexual activity has changed since you were last screened, and you are at increased risk for chlamydia or gonorrhea. Ask your health care provider if   you are at risk.  Ask your health care provider about whether you are at high risk for HIV. Your health care provider may recommend a prescription medicine to help prevent HIV infection. If you choose to take medicine to prevent HIV, you should first get tested for HIV. You should then be tested every 3 months for as long as you are taking the medicine. Pregnancy  If you are about to stop having your period (premenopausal) and  you may become pregnant, seek counseling before you get pregnant.  Take 400 to 800 micrograms (mcg) of folic acid every day if you become pregnant.  Ask for birth control (contraception) if you want to prevent pregnancy. Osteoporosis and menopause Osteoporosis is a disease in which the bones lose minerals and strength with aging. This can result in bone fractures. If you are 65 years old or older, or if you are at risk for osteoporosis and fractures, ask your health care provider if you should:  Be screened for bone loss.  Take a calcium or vitamin D supplement to lower your risk of fractures.  Be given hormone replacement therapy (HRT) to treat symptoms of menopause. Follow these instructions at home: Lifestyle  Do not use any products that contain nicotine or tobacco, such as cigarettes, e-cigarettes, and chewing tobacco. If you need help quitting, ask your health care provider.  Do not use street drugs.  Do not share needles.  Ask your health care provider for help if you need support or information about quitting drugs. Alcohol use  Do not drink alcohol if: ? Your health care provider tells you not to drink. ? You are pregnant, may be pregnant, or are planning to become pregnant.  If you drink alcohol: ? Limit how much you use to 0-1 drink a day. ? Limit intake if you are breastfeeding.  Be aware of how much alcohol is in your drink. In the [place] one drink equals one 12 oz bottle of beer (355 mL), one 5 oz glass of wine (148 mL), or one 1 oz glass of hard liquor (44 mL). General instructions  Schedule regular health, dental, and eye exams.  Stay current with your vaccines.  Tell your health care provider if: ? You often feel depressed. ? You have ever been abused or do not feel safe at home. Summary  Adopting a healthy lifestyle and getting preventive care are important in promoting health and wellness.  Follow your health care provider's instructions about healthy  diet, exercising, and getting tested or screened for diseases.  Follow your health care provider's instructions on monitoring your cholesterol and blood pressure. This information is not intended to replace advice given to you by your health care provider. Make sure you discuss any questions you have with your health care provider. Document Released: 12/12/2010 Document Revised: 05/22/2018 Document Reviewed: 05/22/2018 Elsevier Patient Education  2020 Elsevier Inc.  

## 2018-12-27 NOTE — Progress Notes (Signed)
Subjective:     Stacie Cardenas is a 38 y.o. female and is here for a comprehensive physical exam. The patient reports problems - right upper breast tenderness and sharp to achy pain that comes and goes for the last week. no family hx of breast cancer. .  Pt is switching jobs. She will not be able to been seen until insurance kicks in.   She is doing well with ozempic. Lost 10lbs. She continues to try to make healthy choices and lose weight. She has been checking her BP and been dropping. At times 90's over 50's. She has not been taking her BP medication.    Social History   Socioeconomic History  . Marital status: Married    Spouse name: Not on file  . Number of children: Not on file  . Years of education: Not on file  . Highest education level: Not on file  Occupational History  . Not on file  Social Needs  . Financial resource strain: Not on file  . Food insecurity    Worry: Not on file    Inability: Not on file  . Transportation needs    Medical: Not on file    Non-medical: Not on file  Tobacco Use  . Smoking status: Never Smoker  . Smokeless tobacco: Never Used  . Tobacco comment: never used tobacco  Substance and Sexual Activity  . Alcohol use: No    Alcohol/week: 0.0 standard drinks  . Drug use: No  . Sexual activity: Yes    Birth control/protection: Surgical  Lifestyle  . Physical activity    Days per week: Not on file    Minutes per session: Not on file  . Stress: Not on file  Relationships  . Social Herbalist on phone: Not on file    Gets together: Not on file    Attends religious service: Not on file    Active member of club or organization: Not on file    Attends meetings of clubs or organizations: Not on file    Relationship status: Not on file  . Intimate partner violence    Fear of current or ex partner: Not on file    Emotionally abused: Not on file    Physically abused: Not on file    Forced sexual activity: Not on file  Other Topics  Concern  . Not on file  Social History Narrative  . Not on file   Health Maintenance  Topic Date Due  . URINE MICROALBUMIN  05/07/2015  . INFLUENZA VACCINE  01/11/2019  . FOOT EXAM  03/20/2019  . HEMOGLOBIN A1C  04/18/2019  . OPHTHALMOLOGY EXAM  06/01/2019  . PAP SMEAR-Modifier  02/12/2020  . TETANUS/TDAP  03/14/2022  . PNEUMOCOCCAL POLYSACCHARIDE VACCINE AGE 33-64 HIGH RISK  Completed  . HIV Screening  Completed    The following portions of the patient's history were reviewed and updated as appropriate: allergies, current medications, past family history, past medical history, past social history, past surgical history and problem list.  Review of Systems Pertinent items noted in HPI and remainder of comprehensive ROS otherwise negative.   Objective:    BP 118/70   Pulse 95   Ht 5' 4"  (1.626 m)   Wt 207 lb (93.9 kg)   SpO2 98%   BMI 35.53 kg/m  General appearance: alert, cooperative and appears stated age Head: Normocephalic, without obvious abnormality, atraumatic Eyes: conjunctivae/corneas clear. PERRL, EOM's intact. Fundi benign. Ears: normal TM's and external  ear canals both ears Nose: Nares normal. Septum midline. Mucosa normal. No drainage or sinus tenderness. Throat: lips, mucosa, and tongue normal; teeth and gums normal Neck: no adenopathy, no carotid bruit, no JVD, supple, symmetrical, trachea midline and thyroid not enlarged, symmetric, no tenderness/mass/nodules Back: symmetric, no curvature. ROM normal. No CVA tenderness. Lungs: clear to auscultation bilaterally Breasts: Inspection negative, No nipple retraction or dimpling, No nipple discharge or bleeding, No axillary or supraclavicular adenopathy, Normal to palpation without dominant masses, right upper chest tenderness to palpation without redness or masses.  Heart: regular rate and rhythm, S1, S2 normal, no murmur, click, rub or gallop Abdomen: soft, non-tender; bowel sounds normal; no masses,  no  organomegaly Pelvic: cervix normal in appearance, external genitalia normal, no adnexal masses or tenderness, no cervical motion tenderness, uterus normal size, shape, and consistency, vagina normal without discharge and multiple sebaceous cyst on labia.  Extremities: extremities normal, atraumatic, no cyanosis or edema Pulses: 2+ and symmetric Skin: Skin color, texture, turgor normal. No rashes or lesions Lymph nodes: Cervical, supraclavicular, and axillary nodes normal. Neurologic: Alert and oriented X 3, normal strength and tone. Normal symmetric reflexes. Normal coordination and gait    Assessment:    Healthy female exam.                                                                                                                                                                                                                      Plan:    Marland KitchenMarland KitchenMelannie was seen today for annual exam.  Diagnoses and all orders for this visit:  Routine physical examination  Controlled type 2 diabetes mellitus with other specified complication, without long-term current use of insulin ([facility]) -     COMPLETE METABOLIC PANEL WITH GFR  Mixed hyperlipidemia -     Lipid Panel w/reflex Direct LDL  No energy -     CBC w/Diff/Platelet -     Ferritin -     B12 and Folate Panel -     VITAMIN D 25 Hydroxy (Vit-D Deficiency, Fractures)  Routine Papanicolaou smear -     Cytology - PAP  Breast pain, right   .Marland Kitchen Depression screen [facility] 2/9 12/30/2018 11/07/2017 09/24/2013 06/23/2013  Decreased Interest 0 0 0 0  Down, Depressed, Hopeless 0 0 0 0  PHQ - 2 Score 0 0 0 0    Lab Results  Component Value Date   HGBA1C 6.0 (H) 10/16/2018    Discussed 150 minutes of exercise a week.  Encouraged vitamin D 1000 units and Calcium 132m or 4 servings of dairy a day.  Fasting labs ordered.  Pap done today. Declined STI testing.  BP looks great without medication. Stop for now and monitor BP. Report average readings  over 2 weeks. Hopefully weight loss is help with BP.   A!C too soon. Continue ozempic. Doing well and losing weight.   No breast mass palpated today. Tenderness over pectoralis muscle likely strain use warm compresses, icy hot and NSAID for a few days. Discussed some stretches as well.  If not improving in 1 week let me know and can order mammogram.   See After Visit Summary for Counseling Recommendations

## 2018-12-28 LAB — CBC WITH DIFFERENTIAL/PLATELET
Absolute Monocytes: 518 cells/uL (ref 200–950)
Basophils Absolute: 37 cells/uL (ref 0–200)
Basophils Relative: 0.5 %
Eosinophils Absolute: 139 cells/uL (ref 15–500)
Eosinophils Relative: 1.9 %
HCT: 37.9 % (ref 35.0–45.0)
Hemoglobin: 11.9 g/dL (ref 11.7–15.5)
Lymphs Abs: 3730 cells/uL (ref 850–3900)
MCH: 25.8 pg — ABNORMAL LOW (ref 27.0–33.0)
MCHC: 31.4 g/dL — ABNORMAL LOW (ref 32.0–36.0)
MCV: 82 fL (ref 80.0–100.0)
MPV: 11.9 fL (ref 7.5–12.5)
Monocytes Relative: 7.1 %
Neutro Abs: 2876 cells/uL (ref 1500–7800)
Neutrophils Relative %: 39.4 %
Platelets: 274 10*3/uL (ref 140–400)
RBC: 4.62 10*6/uL (ref 3.80–5.10)
RDW: 14.4 % (ref 11.0–15.0)
Total Lymphocyte: 51.1 %
WBC: 7.3 10*3/uL (ref 3.8–10.8)

## 2018-12-28 LAB — COMPLETE METABOLIC PANEL WITH GFR
AG Ratio: 1.2 (calc) (ref 1.0–2.5)
ALT: 17 U/L (ref 6–29)
AST: 18 U/L (ref 10–30)
Albumin: 3.7 g/dL (ref 3.6–5.1)
Alkaline phosphatase (APISO): 51 U/L (ref 31–125)
BUN: 9 mg/dL (ref 7–25)
CO2: 30 mmol/L (ref 20–32)
Calcium: 9.1 mg/dL (ref 8.6–10.2)
Chloride: 104 mmol/L (ref 98–110)
Creat: 0.91 mg/dL (ref 0.50–1.10)
GFR, Est African American: 93 mL/min/{1.73_m2} (ref >=60)
GFR, Est Non African American: 81 mL/min/{1.73_m2} (ref >=60)
Globulin: 3.2 g/dL (calc) (ref 1.9–3.7)
Glucose, Bld: 77 mg/dL (ref 65–99)
Potassium: 4.1 mmol/L (ref 3.5–5.3)
Sodium: 139 mmol/L (ref 135–146)
Total Bilirubin: 0.4 mg/dL (ref 0.2–1.2)
Total Protein: 6.9 g/dL (ref 6.1–8.1)

## 2018-12-28 LAB — LIPID PANEL W/REFLEX DIRECT LDL
Cholesterol: 212 mg/dL — ABNORMAL HIGH (ref <200)
HDL: 46 mg/dL — ABNORMAL LOW (ref >=50)
LDL Cholesterol (Calc): 147 mg/dL (calc) — ABNORMAL HIGH
Non-HDL Cholesterol (Calc): 166 mg/dL (calc) — ABNORMAL HIGH (ref <130)
Total CHOL/HDL Ratio: 4.6 (calc) (ref <5.0)
Triglycerides: 82 mg/dL (ref <150)

## 2018-12-28 LAB — VITAMIN D 25 HYDROXY (VIT D DEFICIENCY, FRACTURES): Vit D, 25-Hydroxy: 20 ng/mL — ABNORMAL LOW (ref 30–100)

## 2018-12-28 LAB — FERRITIN: Ferritin: 22 ng/mL (ref 16–154)

## 2018-12-28 LAB — B12 AND FOLATE PANEL
Folate: 7.9 ng/mL
Vitamin B-12: 844 pg/mL (ref 200–1100)

## 2018-12-30 ENCOUNTER — Other Ambulatory Visit: Payer: Self-pay | Admitting: Physician Assistant

## 2018-12-30 DIAGNOSIS — R5383 Other fatigue: Secondary | ICD-10-CM | POA: Insufficient documentation

## 2018-12-30 DIAGNOSIS — N644 Mastodynia: Secondary | ICD-10-CM | POA: Insufficient documentation

## 2018-12-30 DIAGNOSIS — E1169 Type 2 diabetes mellitus with other specified complication: Secondary | ICD-10-CM

## 2018-12-30 MED ORDER — VITAMIN D (ERGOCALCIFEROL) 1.25 MG (50000 UNIT) PO CAPS: 50000.0000 [IU] | ORAL_CAPSULE | ORAL | 0 refills | Status: DC

## 2018-12-30 NOTE — Progress Notes (Signed)
Call pt: LDL is not to goal especially with diabetes. Are you taking zocor daily?  B12 looks fabulous.  Vitamin d is low. I can give you high dose for next 3 months.  Iron stores a little low but not anemic. Make sure eating iron rich foods and can take a OtC supplement.

## 2018-12-30 NOTE — Addendum Note (Signed)
Addended by: [name] on: 12/30/2018 07:38 AM   Modules accepted: Orders

## 2018-12-31 LAB — CYTOLOGY - PAP
Diagnosis: NEGATIVE
HPV: NOT DETECTED

## 2018-12-31 NOTE — Progress Notes (Signed)
Normal cytololgy and negative for HPV. Stacie Cardenas you can change her follow up pap timeframe to 5 years. GREAT news.

## 2019-02-24 ENCOUNTER — Other Ambulatory Visit: Payer: Self-pay | Admitting: Physician Assistant

## 2019-02-24 ENCOUNTER — Telehealth: Payer: Self-pay | Admitting: Physician Assistant

## 2019-02-24 DIAGNOSIS — D508 Other iron deficiency anemias: Secondary | ICD-10-CM

## 2019-02-24 DIAGNOSIS — D518 Other vitamin B12 deficiency anemias: Secondary | ICD-10-CM

## 2019-02-24 MED ORDER — PREDNISONE 10 MG PO TABS: ORAL_TABLET | ORAL | 0 refills | Status: DC

## 2019-02-24 NOTE — Telephone Encounter (Signed)
I will send you a prednisone taper. If you get to the 79m and still do not have appt let me know.

## 2019-02-24 NOTE — Telephone Encounter (Signed)
Stacie Cardenas's insurance changed and they are requiring her to see a new GI provider.  Her appointment is scheduled for 02/27/19 at [facility].  She has been out of her Remicade for two weeks now and she's not sure if they will be able to do her PA before Thursday.  She wants to know if you can call her in some Prednisone until she can get back on her Remicade.  If you need to speak with her, please call her at [phone].

## 2019-02-24 NOTE — Telephone Encounter (Signed)
Patient made aware. RX faxed to pharmacy with confirmation received.

## 2019-03-16 ENCOUNTER — Other Ambulatory Visit: Payer: Self-pay | Admitting: Physician Assistant

## 2019-03-16 DIAGNOSIS — I10 Essential (primary) hypertension: Secondary | ICD-10-CM

## 2019-03-23 ENCOUNTER — Other Ambulatory Visit: Payer: Self-pay | Admitting: Physician Assistant

## 2019-03-23 DIAGNOSIS — D518 Other vitamin B12 deficiency anemias: Secondary | ICD-10-CM

## 2019-03-23 DIAGNOSIS — D508 Other iron deficiency anemias: Secondary | ICD-10-CM

## 2019-03-23 DIAGNOSIS — E1165 Type 2 diabetes mellitus with hyperglycemia: Secondary | ICD-10-CM

## 2019-03-30 ENCOUNTER — Other Ambulatory Visit: Payer: Self-pay | Admitting: Physician Assistant

## 2019-03-31 NOTE — Telephone Encounter (Signed)
Has been on for 3 months please advise if should continue.

## 2019-05-30 ENCOUNTER — Telehealth: Payer: Self-pay | Admitting: Neurology

## 2019-05-30 DIAGNOSIS — G9332 Myalgic encephalomyelitis/chronic fatigue syndrome: Secondary | ICD-10-CM

## 2019-05-30 DIAGNOSIS — R5382 Chronic fatigue, unspecified: Secondary | ICD-10-CM

## 2019-05-30 MED ORDER — BUPROPION HCL ER (XL) 150 MG PO TB24: ORAL_TABLET | ORAL | 0 refills | Status: DC

## 2019-05-30 NOTE — Telephone Encounter (Signed)
Patient made aware. Short supply sent to pharmacy.

## 2019-05-30 NOTE — Telephone Encounter (Signed)
Wellbutrin 1109m is the lowest dose. If she wanted to cut in half for 7 days she could before stopping.

## 2019-05-30 NOTE — Telephone Encounter (Signed)
Patient left vm wanting to stop Wellbutrin. She states it was started due to decreased energy but she thinks this was due to low b12. Please advise if okay to stop and how patient should taper down if appropriate. She is currently taking Wellbutrin 150 mg XL daily.

## 2019-06-04 ENCOUNTER — Telehealth: Payer: Self-pay | Admitting: Physician Assistant

## 2019-06-04 DIAGNOSIS — E1169 Type 2 diabetes mellitus with other specified complication: Secondary | ICD-10-CM

## 2019-06-04 NOTE — Telephone Encounter (Signed)
Received fax from [employer] that Ozempic requires a PA. Information has been sent to the insurance company. Awaiting determination.

## 2019-06-09 ENCOUNTER — Other Ambulatory Visit: Payer: Self-pay | Admitting: Physician Assistant

## 2019-06-09 DIAGNOSIS — E1169 Type 2 diabetes mellitus with other specified complication: Secondary | ICD-10-CM

## 2019-06-09 MED ORDER — OZEMPIC (0.25 OR 0.5 MG/DOSE) 2 MG/1.5ML ~~LOC~~ SOPN: 0.5000 mg | PEN_INJECTOR | SUBCUTANEOUS | 1 refills | Status: DC

## 2019-06-09 NOTE — Telephone Encounter (Signed)
Received fax from [employer] that Ozempic was approved from 06/04/2019 through 06/03/2020.  Pharmacy notified and forms sent to scan.

## 2019-06-29 ENCOUNTER — Other Ambulatory Visit: Payer: Self-pay | Admitting: Physician Assistant

## 2019-06-29 DIAGNOSIS — E1165 Type 2 diabetes mellitus with hyperglycemia: Secondary | ICD-10-CM

## 2019-07-03 ENCOUNTER — Encounter: Payer: Self-pay | Admitting: Sports Medicine

## 2019-07-03 ENCOUNTER — Other Ambulatory Visit: Payer: Self-pay | Admitting: *Deleted

## 2019-07-03 ENCOUNTER — Other Ambulatory Visit: Payer: Self-pay

## 2019-07-03 ENCOUNTER — Telehealth: Payer: Self-pay | Admitting: *Deleted

## 2019-07-03 ENCOUNTER — Ambulatory Visit (INDEPENDENT_AMBULATORY_CARE_PROVIDER_SITE_OTHER): Payer: PRIVATE HEALTH INSURANCE | Admitting: Sports Medicine

## 2019-07-03 DIAGNOSIS — L02414 Cutaneous abscess of left upper limb: Secondary | ICD-10-CM

## 2019-07-03 MED ORDER — DOXYCYCLINE HYCLATE 100 MG PO TABS: 100.0000 mg | ORAL_TABLET | Freq: Two times a day (BID) | ORAL | 0 refills | Status: AC

## 2019-07-03 MED ORDER — HYDROCODONE-ACETAMINOPHEN 5-325 MG PO TABS: 1.0000 | ORAL_TABLET | Freq: Three times a day (TID) | ORAL | 0 refills | Status: DC | PRN

## 2019-07-03 MED ORDER — ONDANSETRON 8 MG PO TBDP: 8.0000 mg | ORAL_TABLET | Freq: Three times a day (TID) | ORAL | 3 refills | Status: DC | PRN

## 2019-07-03 MED ORDER — FLUCONAZOLE 150 MG PO TABS: 150.0000 mg | ORAL_TABLET | Freq: Once | ORAL | 0 refills | Status: AC

## 2019-07-03 NOTE — Assessment & Plan Note (Signed)
Stacie Cardenas has had swelling of her left elbow, somewhat distal to the olecranon bursa. Clinically it looks like an abscess, weeks of breath some purulence and obtained a culture. Adding hydrocodone for pain, 7 days of doxycycline. Return to see me next week.

## 2019-07-03 NOTE — Telephone Encounter (Signed)
Pt called today and stated that she is now really nauseated after taking just one dose of Doxycycline today and wants to know if you could change it to something else.  Please advise.

## 2019-07-03 NOTE — Progress Notes (Signed)
    Procedures performed today:    None.  Independent interpretation of tests performed by another provider:   None.  Impression and Recommendations:    Abscess of left elbow Stacie Cardenas has had swelling of her left elbow, somewhat distal to the olecranon bursa. Clinically it looks like an abscess, weeks of breath some purulence and obtained a culture. Adding hydrocodone for pain, 7 days of doxycycline. Return to see me next week.    ___________________________________________ Gwen Her. Dianah Field, M.D., ABFM., CAQSM. Primary Care and [place] [employer] [facility]

## 2019-07-03 NOTE — Telephone Encounter (Signed)
Try it again with food and Zofran, calling that in.

## 2019-07-04 NOTE — Telephone Encounter (Signed)
Pt.notified

## 2019-07-06 LAB — WOUND CULTURE
MICRO NUMBER:: 10067298
RESULT:: NO GROWTH
SPECIMEN QUALITY:: ADEQUATE

## 2019-08-06 ENCOUNTER — Other Ambulatory Visit: Payer: Self-pay

## 2019-08-06 DIAGNOSIS — E1169 Type 2 diabetes mellitus with other specified complication: Secondary | ICD-10-CM

## 2019-08-06 MED ORDER — OZEMPIC (0.25 OR 0.5 MG/DOSE) 2 MG/1.5ML ~~LOC~~ SOPN: 0.5000 mg | PEN_INJECTOR | SUBCUTANEOUS | 1 refills | Status: DC

## 2020-01-27 ENCOUNTER — Telehealth (INDEPENDENT_AMBULATORY_CARE_PROVIDER_SITE_OTHER): Payer: PRIVATE HEALTH INSURANCE | Admitting: Physician Assistant

## 2020-01-27 ENCOUNTER — Telehealth: Payer: Self-pay | Admitting: Neurology

## 2020-01-27 ENCOUNTER — Encounter: Payer: Self-pay | Admitting: Physician Assistant

## 2020-01-27 VITALS — BP 136/86 | HR 81 | Temp 98.4°F | Ht 64.0 in | Wt 207.0 lb

## 2020-01-27 DIAGNOSIS — E1169 Type 2 diabetes mellitus with other specified complication: Secondary | ICD-10-CM

## 2020-01-27 DIAGNOSIS — R05 Cough: Secondary | ICD-10-CM

## 2020-01-27 DIAGNOSIS — K5000 Crohn's disease of small intestine without complications: Secondary | ICD-10-CM

## 2020-01-27 DIAGNOSIS — U071 COVID-19: Secondary | ICD-10-CM | POA: Diagnosis not present

## 2020-01-27 DIAGNOSIS — R52 Pain, unspecified: Secondary | ICD-10-CM

## 2020-01-27 DIAGNOSIS — R059 Cough, unspecified: Secondary | ICD-10-CM

## 2020-01-27 DIAGNOSIS — D649 Anemia, unspecified: Secondary | ICD-10-CM

## 2020-01-27 DIAGNOSIS — L0291 Cutaneous abscess, unspecified: Secondary | ICD-10-CM

## 2020-01-27 DIAGNOSIS — R6883 Chills (without fever): Secondary | ICD-10-CM

## 2020-01-27 MED ORDER — HYDROCOD POLST-CPM POLST ER 10-8 MG/5ML PO SUER: 5.0000 mL | Freq: Two times a day (BID) | ORAL | 0 refills | Status: DC | PRN

## 2020-01-27 MED ORDER — FLUCONAZOLE 150 MG PO TABS: 150.0000 mg | ORAL_TABLET | Freq: Once | ORAL | 0 refills | Status: DC

## 2020-01-27 MED ORDER — SULFAMETHOXAZOLE-TRIMETHOPRIM 800-160 MG PO TABS: 1.0000 | ORAL_TABLET | Freq: Two times a day (BID) | ORAL | 0 refills | Status: DC

## 2020-01-27 NOTE — Progress Notes (Signed)
Patient ID: Stacie Cardenas, female   DOB: March 31, 1981, 39 y.o.   MRN: [id number] .Marland KitchenVirtual Visit via Video Note  I connected with Stacie Cardenas on 01/27/20 at  2:20 PM EDT by a video enabled telemedicine application and verified that I am speaking with the correct person using two identifiers.  Location: Patient: home Provider: clinic   I discussed the limitations of evaluation and management by telemedicine and the availability of in person appointments. The patient expressed understanding and agreed to proceed.  History of Present Illness: Pt is a 39 yo obese female with Chrons, T2DM and positive for covid. She calls into the clinic with cough, body aches, fatigue. She is short of breath but not wheezing and seems manageable. She has not had vaccine. She tested positive on 8/12 but symptoms started 8/11. She had CBC done at [facility] showed anemia 11.1 HgB. She wonders what next steps are. Pt has hx of abscess that need I and D. She has on on her vagina labia. It is painful and draining. Due to covid she cannot get it looked at. No fever.   .. Active Ambulatory Problems    Diagnosis Date Noted   Mixed hyperlipidemia 04/27/2010   Iron deficiency anemia 01/21/2010   HYPERTENSION, BENIGN ESSENTIAL 04/26/2006   WEIGHT GAIN 11/13/2008   MIGRAINE HEADACHE 06/28/2010   Menorrhagia 07/03/2013   Crohn's disease ([place]) 03/30/2014   Type 2 diabetes mellitus, controlled ([place]) 06/08/2014   Insomnia 06/20/2015   Absolute anemia 09/07/2015   Vitamin D deficiency 10/03/2016   Morbidly obese ([place]) 10/20/2016   Chronic fatigue syndrome 02/18/2017   Chronic left-sided low back pain without sciatica 07/08/2017   Anxiety 07/08/2017   Hidradenitis suppurativa 11/09/2017   Hyperpigmentation of skin, postinflammatory 11/09/2017   Acanthosis nigricans 11/09/2017   Dyslipidemia (high LDL; low HDL) 03/20/2018   No energy 12/30/2018   Breast pain, right 12/30/2018   Abscess of left elbow  07/03/2019   Abscess 01/27/2020   COVID-19 virus infection 01/27/2020   Anemia 01/27/2020   Resolved Ambulatory Problems    Diagnosis Date Noted   ABSCESS OF [place] GLAND 02/19/2009   CARBUNCLE/FURUNCLE NOS 04/26/2006   SKIN RASH 11/13/2008   SYMPTOM, FREQUENCY, URINARY 04/26/2006   Abdominal pain, epigastric 04/27/2010   Impaired fasting glucose 27/74/1287   NEOPLASM UNCERTAIN BHV OTH&UNSPEC FE GENIT ORGN 08/10/2010   VAGINAL DISCHARGE 86/76/7209   FOLLICULITIS 47/02/6282   SORE THROAT 01/10/2011   ABDOMINAL PAIN 01/10/2011   Hyperlipidemia 10/14/2012   Obesity, unspecified 10/14/2012   Other iron deficiency anemias 03/17/2013   Elevated hemoglobin A1c 05/11/2014   Abscess of left axilla 12/31/2014   Bilateral lower abdominal pain 09/07/2015   Ruptured ear drum, right 08/29/2017   Past Medical History:  Diagnosis Date   Hypercholesterolemia    Hypertension    IBS (irritable bowel syndrome)    Other specified iron deficiency anemias 03/17/2013   Reviewed med, allergy, problem list.     Observations/Objective: No acute distress Productive cough Normal mood.  No labored breathing.   .. Today's Vitals   01/27/20 1117  BP: 136/86  Pulse: 81  Temp: 98.4 F (36.9 C)  TempSrc: Oral  SpO2: 98%  Weight: 207 lb (93.9 kg)  Height: 5' 4"  (1.626 m)   Body mass index is 35.53 kg/m.    Assessment and Plan: Marland KitchenMarland KitchenChalese was seen today for covid exposure.  Diagnoses and all orders for this visit:  COVID-19 virus infection  Controlled type 2 diabetes mellitus with other specified  complication, without long-term current use of insulin (HCC)  Crohn's disease of small intestine without complication (HCC)  Cough  Chills  Body aches  Abscess  Anemia, unspecified type   Pt due to high risk of complications would benefit from infusion. Called clinic for them to screen patient.  Discussed symptomatic care with hydration, tylenol, mucinex,  tussinonex, and bactrim. Bactrim will treat vaginal abscess as well. Albuterol as needed.  Warm compress on abscess. Follow up once out of quarantine 10 days after symptoms started.  If pulse ox drops below 90 or sudden change in breathing go to ED.  Discussed good deep breathing.    Follow Up Instructions:    I discussed the assessment and treatment plan with the patient. The patient was provided an opportunity to ask questions and all were answered. The patient agreed with the plan and demonstrated an understanding of the instructions.   The patient was advised to call back or seek an in-person evaluation if the symptoms worsen or if the condition fails to improve as anticipated.  I provided 20 minutes of non-face-to-face time during this encounter.   [name], PA-C

## 2020-01-27 NOTE — Telephone Encounter (Signed)
LMOM with Covid outpatient infusion clinic to call patient to get her scheduled.

## 2020-01-27 NOTE — Progress Notes (Signed)
Covid +- urgent care Thursday - got results Friday Chills  Body aches Headache  Yesterday - lost taste/smell  No fevers  Has two cysts on her vaginal lip - been using hot compresses, painful when urinating

## 2020-01-28 ENCOUNTER — Ambulatory Visit (HOSPITAL_COMMUNITY)
Admission: RE | Admit: 2020-01-28 | Discharge: 2020-01-28 | Disposition: A | Discharge status: Home / Self Care | Payer: PRIVATE HEALTH INSURANCE | Source: Ambulatory Visit | Attending: Pulmonary Disease | Admitting: Pulmonary Disease

## 2020-01-28 ENCOUNTER — Telehealth (HOSPITAL_COMMUNITY): Payer: Self-pay | Admitting: Oncology

## 2020-01-28 ENCOUNTER — Telehealth: Payer: Self-pay | Admitting: Oncology

## 2020-01-28 ENCOUNTER — Other Ambulatory Visit: Payer: Self-pay | Admitting: Oncology

## 2020-01-28 DIAGNOSIS — U071 COVID-19: Secondary | ICD-10-CM

## 2020-01-28 MED ORDER — FAMOTIDINE IN NACL 20-0.9 MG/50ML-% IV SOLN: 20.0000 mg | Freq: Once | INTRAVENOUS | Status: DC | PRN

## 2020-01-28 MED ORDER — ALBUTEROL SULFATE HFA 108 (90 BASE) MCG/ACT IN AERS: 2.0000 | INHALATION_SPRAY | Freq: Once | RESPIRATORY_TRACT | Status: DC | PRN

## 2020-01-28 MED ORDER — EPINEPHRINE 0.3 MG/0.3ML IJ SOAJ: 0.3000 mg | Freq: Once | INTRAMUSCULAR | Status: DC | PRN

## 2020-01-28 MED ORDER — DIPHENHYDRAMINE HCL 50 MG/ML IJ SOLN: 50.0000 mg | Freq: Once | INTRAMUSCULAR | Status: DC | PRN

## 2020-01-28 MED ORDER — SODIUM CHLORIDE 0.9 % IV SOLN
1200.0000 mg | Freq: Once | INTRAVENOUS | Status: AC
  Administered 2020-01-28: 1200 mg via INTRAVENOUS
  Filled 2020-01-28: qty 10

## 2020-01-28 MED ORDER — METHYLPREDNISOLONE SODIUM SUCC 125 MG IJ SOLR: 125.0000 mg | Freq: Once | INTRAMUSCULAR | Status: DC | PRN

## 2020-01-28 MED ORDER — SODIUM CHLORIDE 0.9 % IV SOLN: INTRAVENOUS | Status: DC | PRN

## 2020-01-28 NOTE — Progress Notes (Signed)
  Diagnosis: COVID-19  Physician: Dr Asencion Noble  Procedure: Covid Infusion Clinic Med: casirivimab\imdevimab infusion - Provided patient with casirivimab\imdevimab fact sheet for patients, parents and caregivers prior to infusion.  Complications: No immediate complications noted.  Discharge: Discharged home   Stacie Cardenas 01/28/2020

## 2020-01-28 NOTE — Discharge Instructions (Signed)

## 2020-01-28 NOTE — Telephone Encounter (Signed)
Left voicemail for patient with regards to her Mab infusion and getting her COVID-19 vaccine.  Patient had concerns about getting her Mab infusion today because her job is requiring her to get her COVID-19 vaccine and she must wait 90 days from her Mab infusion.  She should be able to fill out an exemption form to get her vaccination 90 days from her mab infusion.  This is typically filled out her PCP.  I asked her to call me with any additional questions.  I did recommend she proceed with the Mab infusion today.   Faythe Casa, NP 01/28/2020 3:54 PM

## 2020-01-28 NOTE — Telephone Encounter (Signed)
I connected by phone with MRs. Molony at 12:00pm to discuss the potential use of an new treatment for mild to moderate COVID-19 viral infection in non-hospitalized patients.   This patient is a age/sex that meets the FDA criteria for Emergency Use Authorization of casirivimab\imdevimab.  Has a (+) direct SARS-CoV-2 viral test result 1. Has mild or moderate COVID-19  2. Is ? 39 years of age and weighs ? 40 kg 3. Is NOT hospitalized due to COVID-19 4. Is NOT requiring oxygen therapy or requiring an increase in baseline oxygen flow rate due to COVID-19 5. Is within 10 days of symptom onset 6. Has at least one of the high risk factor(s) for progression to severe COVID-19 and/or hospitalization as defined in EUA. ? Specific high risk criteria : CAD, DM, Obesity   Symptom onset  01/21/20   I have spoken and communicated the following to the patient or parent/caregiver:   1. FDA has authorized the emergency use of casirivimab\imdevimab for the treatment of mild to moderate COVID-19 in adults and pediatric patients with positive results of direct SARS-CoV-2 viral testing who are 5 years of age and older weighing at least 40 kg, and who are at high risk for progressing to severe COVID-19 and/or hospitalization.   2. The significant known and potential risks and benefits of casirivimab\imdevimab, and the extent to which such potential risks and benefits are unknown.   3. Information on available alternative treatments and the risks and benefits of those alternatives, including clinical trials.   4. Patients treated with casirivimab\imdevimab should continue to self-isolate and use infection control measures (e.g., wear mask, isolate, social distance, avoid sharing personal items, clean and disinfect high touch surfaces, and frequent handwashing) according to CDC guidelines.    5. The patient or parent/caregiver has the option to accept or refuse casirivimab\imdevimab .   After reviewing this  information with the patient, The patient agreed to proceed with receiving casirivimab\imdevimab infusion and will be provided a copy of the Fact sheet prior to receiving the infusion.Rulon Abide, AGNP-C [phone] ([place])

## 2020-02-25 ENCOUNTER — Other Ambulatory Visit: Payer: Self-pay | Admitting: Nurse Practitioner

## 2020-04-07 ENCOUNTER — Telehealth (INDEPENDENT_AMBULATORY_CARE_PROVIDER_SITE_OTHER): Payer: PRIVATE HEALTH INSURANCE | Admitting: Physician Assistant

## 2020-04-07 VITALS — BP 127/77 | Ht 64.0 in | Wt 214.0 lb

## 2020-04-07 DIAGNOSIS — J029 Acute pharyngitis, unspecified: Secondary | ICD-10-CM | POA: Diagnosis not present

## 2020-04-07 LAB — POCT RAPID STREP A (OFFICE): Rapid Strep A Screen: NEGATIVE

## 2020-04-07 NOTE — Progress Notes (Signed)
Started Sunday Sore throat Worse when leans head back Some diarrhea today  No congestion, cough, other symptoms Has not tried any medications OTC

## 2020-04-07 NOTE — Progress Notes (Signed)
Strep throat negative. I can give you some tramadol for ST for next 24 hours. Use flonase to help with ear "wetness" continue to gargle. Up date on Friday.

## 2020-04-07 NOTE — Progress Notes (Signed)
Patient ID: Stacie Cardenas, female   DOB: [birth year], 39 y.o.   MRN: [id number] .Marland KitchenVirtual Visit via Video Note  I connected with Stacie Cardenas on 04/07/2020 at 10:50 AM EDT by a video enabled telemedicine application and verified that I am speaking with the correct person using two identifiers.  Location: Patient: home Provider: clinic   I discussed the limitations of evaluation and management by telemedicine and the availability of in person appointments. The patient expressed understanding and agreed to proceed.  History of Present Illness: Patient is a 39 year old obese female with Crohn's disease who presents to the clinic with sore throat for 4 days.  She denies any fever, chills, nausea, vomiting, cough, shortness of breath, sinus pressure, ear pain.  Her sore throat is worse when she leans her head back and she does feel some swollen lymph nodes in her neck.  She has taken some over-the-counter sore throat medication with little relief.  She is gargling with salt water which helps some.  She denies any worsening reflux.  Overall she does not feel that bad.  She did home test negative for Covid as well as she recently had Covid and states " this feels nothing like Covid".  Patient is on Biologics for her Crohn's disease which makes her immunocompromise.  .. Active Ambulatory Problems    Diagnosis Date Noted  . Mixed hyperlipidemia 04/27/2010  . Iron deficiency anemia 01/21/2010  . HYPERTENSION, BENIGN ESSENTIAL 04/26/2006  . WEIGHT GAIN 11/13/2008  . MIGRAINE HEADACHE 06/28/2010  . Menorrhagia 07/03/2013  . Crohn's disease ([place]) 03/30/2014  . Type 2 diabetes mellitus, controlled ([place]) 06/08/2014  . Insomnia 06/20/2015  . Absolute anemia 09/07/2015  . Vitamin D deficiency 10/03/2016  . Morbidly obese ([place]) 10/20/2016  . Chronic fatigue syndrome 02/18/2017  . Chronic left-sided low back pain without sciatica 07/08/2017  . Anxiety 07/08/2017  . Hidradenitis suppurativa 11/09/2017  .  Hyperpigmentation of skin, postinflammatory 11/09/2017  . Acanthosis nigricans 11/09/2017  . Dyslipidemia (high LDL; low HDL) 03/20/2018  . No energy 12/30/2018  . Breast pain, right 12/30/2018  . Abscess of left elbow 07/03/2019  . Abscess 01/27/2020  . COVID-19 virus infection 01/27/2020  . Anemia 01/27/2020   Resolved Ambulatory Problems    Diagnosis Date Noted  . [place] GLAND 02/19/2009  . CARBUNCLE/FURUNCLE NOS 04/26/2006  . SKIN RASH 11/13/2008  . SYMPTOM, FREQUENCY, URINARY 04/26/2006  . Abdominal pain, epigastric 04/27/2010  . Impaired fasting glucose 04/27/2010  . 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 VAGINAL DISCHARGE 08/10/2010  . FOLLICULITIS 54/00/8676  . SORE THROAT 01/10/2011  . ABDOMINAL PAIN 01/10/2011  . Hyperlipidemia 10/14/2012  . Obesity, unspecified 10/14/2012  . Other iron deficiency anemias 03/17/2013  . Elevated hemoglobin A1c 05/11/2014  . Abscess of left axilla 12/31/2014  . Bilateral lower abdominal pain 09/07/2015  . Ruptured ear drum, right 08/29/2017   Past Medical History:  Diagnosis Date  . Hypercholesterolemia   . Hypertension   . IBS (irritable bowel syndrome)   . Other specified iron deficiency anemias 03/17/2013   Reviewed med, allergy, problem list.   Observations/Objective: No acute distress Normal breathing. Normal mood and appearance.  No cough.   .. Today's Vitals   04/07/20 1014  BP: 127/77  Weight: 214 lb (97.1 kg)  Height: 5' 4"  (1.626 m)   Body mass index is 36.73 kg/m.    Assessment and Plan: Marland KitchenMarland KitchenEmiko was seen today for sore throat.  Diagnoses and all orders for  this visit:  Sore throat -     POCT rapid strep A  Acute pharyngitis, unspecified etiology   Rapid strep negative. Pt home tested negative for covid and recently had covid. Suspect viral. no red flag symptoms.  Continue to treat symptomatically at this time. Consider medrol dose pack if not better or  worsening. Call with new or changing symptoms.    Follow Up Instructions:    I discussed the assessment and treatment plan with the patient. The patient was provided an opportunity to ask questions and all were answered. The patient agreed with the plan and demonstrated an understanding of the instructions.   The patient was advised to call back or seek an in-person evaluation if the symptoms worsen or if the condition fails to improve as anticipated.     [name], PA-C

## 2020-04-09 ENCOUNTER — Encounter: Payer: Self-pay | Admitting: Physician Assistant

## 2020-04-09 ENCOUNTER — Telehealth: Payer: Self-pay | Admitting: Physician Assistant

## 2020-04-09 NOTE — Telephone Encounter (Signed)
mychart message

## 2020-06-02 LAB — HM DIABETES EYE EXAM

## 2020-06-21 ENCOUNTER — Encounter: Payer: Self-pay | Admitting: Physician Assistant

## 2020-06-21 ENCOUNTER — Telehealth (INDEPENDENT_AMBULATORY_CARE_PROVIDER_SITE_OTHER): Payer: PRIVATE HEALTH INSURANCE | Admitting: Physician Assistant

## 2020-06-21 VITALS — Temp 98.1°F

## 2020-06-21 DIAGNOSIS — J329 Chronic sinusitis, unspecified: Secondary | ICD-10-CM

## 2020-06-21 DIAGNOSIS — J40 Bronchitis, not specified as acute or chronic: Secondary | ICD-10-CM | POA: Diagnosis not present

## 2020-06-21 DIAGNOSIS — R197 Diarrhea, unspecified: Secondary | ICD-10-CM | POA: Diagnosis not present

## 2020-06-21 DIAGNOSIS — U071 COVID-19: Secondary | ICD-10-CM | POA: Diagnosis not present

## 2020-06-21 MED ORDER — AZITHROMYCIN 250 MG PO TABS: ORAL_TABLET | ORAL | 0 refills | Status: DC

## 2020-06-21 MED ORDER — DIPHENOXYLATE-ATROPINE 2.5-0.025 MG PO TABS: ORAL_TABLET | ORAL | 0 refills | Status: DC

## 2020-06-21 NOTE — Progress Notes (Signed)
Pt. Stated she  was taking new medication and experience sinus infection body ache  Coughing chills. test negative friday

## 2020-06-21 NOTE — Progress Notes (Signed)
Patient ID: Stacie Cardenas, female   DOB: 18-Feb-1981, 40 y.o.   MRN: [id number] .Marland KitchenVirtual Visit via Telephone Note  I connected with Stacie Cardenas on 06/21/20 at  8:30 AM EST by telephone and verified that I am speaking with the correct person using two identifiers.  Location: Patient: home Provider: clinic  .Marland KitchenParticipating in visit:  Patient: Stacie Cardenas Provider: [name] PA-C    I discussed the limitations, risks, security and privacy concerns of performing an evaluation and management service by telephone and the availability of in person appointments. I also discussed with the patient that there may be a patient responsible charge related to this service. The patient expressed understanding and agreed to proceed.   History of Present Illness: Pt is a 40 yo female with chrons disease and positive for covid test who presents to the clinic to discuss ongoing symptoms.  Patient has not felt very well since December 31 after her infusion with the biologic entyvio.  She started having flulike symptoms and called gastroenterology they suspected she could have reaction/side effects to biologic and/or flu or COVID.  She was tested for COVID and flu on 06/15/2020 and positive.  She is outside of quarantine but she is still feeling terrible.  She has ongoing diarrhea that is not helped very much with Imodium and also causes a lot of bloating.  She also has a lot of fatigue, cough, shortness of breath.  She is blowing out a lot of green to yellow sputum and coughing it up as well.  She is using albuterol as needed.  She denies any fever.    .. Active Ambulatory Problems    Diagnosis Date Noted  . Mixed hyperlipidemia 04/27/2010  . Iron deficiency anemia 01/21/2010  . HYPERTENSION, BENIGN ESSENTIAL 04/26/2006  . WEIGHT GAIN 11/13/2008  . MIGRAINE HEADACHE 06/28/2010  . Menorrhagia 07/03/2013  . Crohn's disease ([place]) 03/30/2014  . Type 2 diabetes mellitus, controlled ([place]) 06/08/2014  .  Insomnia 06/20/2015  . Absolute anemia 09/07/2015  . Vitamin D deficiency 10/03/2016  . Morbidly obese ([place]) 10/20/2016  . Chronic fatigue syndrome 02/18/2017  . Chronic left-sided low back pain without sciatica 07/08/2017  . Anxiety 07/08/2017  . Hidradenitis suppurativa 11/09/2017  . Hyperpigmentation of skin, postinflammatory 11/09/2017  . Acanthosis nigricans 11/09/2017  . Dyslipidemia (high LDL; low HDL) 03/20/2018  . No energy 12/30/2018  . Breast pain, right 12/30/2018  . Abscess of left elbow 07/03/2019  . Abscess 01/27/2020  . COVID-19 virus infection 01/27/2020  . Anemia 01/27/2020   Resolved Ambulatory Problems    Diagnosis Date Noted  . [place] GLAND 02/19/2009  . CARBUNCLE/FURUNCLE NOS 04/26/2006  . SKIN RASH 11/13/2008  . SYMPTOM, FREQUENCY, URINARY 04/26/2006  . Abdominal pain, epigastric 04/27/2010  . Impaired fasting glucose 04/27/2010  . 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 VAGINAL DISCHARGE 08/10/2010  . FOLLICULITIS 67/89/3810  . SORE THROAT 01/10/2011  . ABDOMINAL PAIN 01/10/2011  . Hyperlipidemia 10/14/2012  . Obesity, unspecified 10/14/2012  . Other iron deficiency anemias 03/17/2013  . Elevated hemoglobin A1c 05/11/2014  . Abscess of left axilla 12/31/2014  . Bilateral lower abdominal pain 09/07/2015  . Ruptured ear drum, right 08/29/2017   Past Medical History:  Diagnosis Date  . Hypercholesterolemia   . Hypertension   . IBS (irritable bowel syndrome)   . Other specified iron deficiency anemias 03/17/2013   Reviewed med, allergy, problem list.    Observations/Objective: No acute distress Normal breathing Productive  cough Normal mood   .Marland Kitchen Today's Vitals   06/21/20 0848  Temp: 98.1 F (36.7 C)  TempSrc: Oral   There is no height or weight on file to calculate BMI.    Assessment and Plan: Marland KitchenMarland KitchenDiagnoses and all orders for this visit:  COVID-19 virus infection -     azithromycin (ZITHROMAX  Z-PAK) 250 MG tablet; Take 2 tablets (500 mg) on  Day 1,  followed by 1 tablet (250 mg) once daily on Days 2 through 5. -     diphenoxylate-atropine (LOMOTIL) 2.5-0.025 MG tablet; One to 2 tablets by mouth 4 times a day as needed for diarrhea.  Sinobronchitis -     azithromycin (ZITHROMAX Z-PAK) 250 MG tablet; Take 2 tablets (500 mg) on  Day 1,  followed by 1 tablet (250 mg) once daily on Days 2 through 5.  Diarrhea, unspecified type -     diphenoxylate-atropine (LOMOTIL) 2.5-0.025 MG tablet; One to 2 tablets by mouth 4 times a day as needed for diarrhea.   Pt has not felt well for 11 days. Added zpak and lomotil. Will hold on dexamethasone for now since due to her immune system. No problems breathing. Rest and hydrate. Written out of work until 13th. Discussed covid vitamins for immune support.  Continue to follow up with GI for chrons management.    Follow Up Instructions:    I discussed the assessment and treatment plan with the patient. The patient was provided an opportunity to ask questions and all were answered. The patient agreed with the plan and demonstrated an understanding of the instructions.   The patient was advised to call back or seek an in-person evaluation if the symptoms worsen or if the condition fails to improve as anticipated.  I provided 15 minutes of non-face-to-face time during this encounter.   [name], PA-C

## 2020-06-24 ENCOUNTER — Encounter: Payer: Self-pay | Admitting: Physician Assistant

## 2020-08-24 ENCOUNTER — Encounter: Payer: Self-pay | Admitting: Physician Assistant

## 2020-12-28 ENCOUNTER — Telehealth: Payer: Self-pay

## 2020-12-28 DIAGNOSIS — B379 Candidiasis, unspecified: Secondary | ICD-10-CM

## 2020-12-28 MED ORDER — FLUCONAZOLE 150 MG PO TABS: 150.0000 mg | ORAL_TABLET | Freq: Once | ORAL | 0 refills | Status: AC

## 2020-12-28 NOTE — Telephone Encounter (Signed)
Hold statin while taking diflucan  Meds ordered this encounter  Medications   fluconazole (DIFLUCAN) 150 MG tablet    Sig: Take 1 tablet (150 mg total) by mouth once for 1 dose. Repeat in 72 hours if symptoms persist.    Dispense:  2 tablet    Refill:  0

## 2020-12-28 NOTE — Telephone Encounter (Signed)
Pt.notified

## 2020-12-28 NOTE — Telephone Encounter (Signed)
Pt called and stated she was on antibiotics after a recent dental procedure and now has a yeast infection. Pt requests a prescription for diflucan.

## 2021-03-28 ENCOUNTER — Encounter: Payer: Self-pay | Admitting: Family

## 2021-04-04 ENCOUNTER — Encounter: Payer: Self-pay | Admitting: Family

## 2021-04-04 ENCOUNTER — Other Ambulatory Visit: Payer: Self-pay

## 2021-04-04 ENCOUNTER — Ambulatory Visit (INDEPENDENT_AMBULATORY_CARE_PROVIDER_SITE_OTHER): Payer: No Typology Code available for payment source | Admitting: Physician Assistant

## 2021-04-04 ENCOUNTER — Ambulatory Visit (INDEPENDENT_AMBULATORY_CARE_PROVIDER_SITE_OTHER): Payer: No Typology Code available for payment source

## 2021-04-04 VITALS — BP 160/90 | HR 86 | Ht 64.0 in | Wt 209.0 lb

## 2021-04-04 DIAGNOSIS — D649 Anemia, unspecified: Secondary | ICD-10-CM

## 2021-04-04 DIAGNOSIS — E1165 Type 2 diabetes mellitus with hyperglycemia: Secondary | ICD-10-CM

## 2021-04-04 DIAGNOSIS — K5000 Crohn's disease of small intestine without complications: Secondary | ICD-10-CM

## 2021-04-04 DIAGNOSIS — E785 Hyperlipidemia, unspecified: Secondary | ICD-10-CM | POA: Diagnosis not present

## 2021-04-04 DIAGNOSIS — E1169 Type 2 diabetes mellitus with other specified complication: Secondary | ICD-10-CM

## 2021-04-04 DIAGNOSIS — Z1329 Encounter for screening for other suspected endocrine disorder: Secondary | ICD-10-CM

## 2021-04-04 DIAGNOSIS — Z1231 Encounter for screening mammogram for malignant neoplasm of breast: Secondary | ICD-10-CM

## 2021-04-04 DIAGNOSIS — M79645 Pain in left finger(s): Secondary | ICD-10-CM

## 2021-04-04 DIAGNOSIS — I10 Essential (primary) hypertension: Secondary | ICD-10-CM

## 2021-04-04 DIAGNOSIS — R110 Nausea: Secondary | ICD-10-CM

## 2021-04-04 DIAGNOSIS — M2550 Pain in unspecified joint: Secondary | ICD-10-CM

## 2021-04-04 DIAGNOSIS — E559 Vitamin D deficiency, unspecified: Secondary | ICD-10-CM

## 2021-04-04 MED ORDER — EMPAGLIFLOZIN 10 MG PO TABS: 10.0000 mg | ORAL_TABLET | Freq: Every day | ORAL | 0 refills | Status: DC

## 2021-04-04 MED ORDER — OZEMPIC (0.25 OR 0.5 MG/DOSE) 2 MG/1.5ML ~~LOC~~ SOPN: 0.5000 mg | PEN_INJECTOR | SUBCUTANEOUS | 0 refills | Status: DC

## 2021-04-04 MED ORDER — ONDANSETRON HCL 8 MG PO TABS: 8.0000 mg | ORAL_TABLET | Freq: Three times a day (TID) | ORAL | 0 refills | Status: DC | PRN

## 2021-04-04 MED ORDER — LOSARTAN POTASSIUM 25 MG PO TABS: 25.0000 mg | ORAL_TABLET | Freq: Every day | ORAL | 0 refills | Status: DC

## 2021-04-04 MED ORDER — ONDANSETRON 8 MG PO TBDP: 8.0000 mg | ORAL_TABLET | Freq: Three times a day (TID) | ORAL | 0 refills | Status: DC | PRN

## 2021-04-04 MED ORDER — SIMVASTATIN 40 MG PO TABS: 40.0000 mg | ORAL_TABLET | Freq: Every evening | ORAL | 4 refills | Status: DC

## 2021-04-04 NOTE — Patient Instructions (Addendum)
Health Maintenance, Female Adopting a healthy lifestyle and getting preventive care are important in promoting health and wellness. Ask your health care provider about: The right schedule for you to have regular tests and exams. Things you can do on your own to prevent diseases and keep yourself healthy. What should I know about diet, weight, and exercise? Eat a healthy diet  Eat a diet that includes plenty of vegetables, fruits, low-fat dairy products, and lean protein. Do not eat a lot of foods that are high in solid fats, added sugars, or sodium. Maintain a healthy weight Body mass index (BMI) is used to identify weight problems. It estimates body fat based on height and weight. Your health care provider can help determine your BMI and help you achieve or maintain a healthy weight. Get regular exercise Get regular exercise. This is one of the most important things you can do for your health. Most adults should: Exercise for at least 150 minutes each week. The exercise should increase your heart rate and make you sweat (moderate-intensity exercise). Do strengthening exercises at least twice a week. This is in addition to the moderate-intensity exercise. Spend less time sitting. Even light physical activity can be beneficial. Watch cholesterol and blood lipids Have your blood tested for lipids and cholesterol at 40 years of age, then have this test every 5 years. Have your cholesterol levels checked more often if: Your lipid or cholesterol levels are high. You are older than 40 years of age. You are at high risk for heart disease. What should I know about cancer screening? Depending on your health history and family history, you may need to have cancer screening at various ages. This may include screening for: Breast cancer. Cervical cancer. Colorectal cancer. Skin cancer. Lung cancer. What should I know about heart disease, diabetes, and high blood pressure? Blood pressure and  heart disease High blood pressure causes heart disease and increases the risk of stroke. This is more likely to develop in people who have high blood pressure readings, are of African descent, or are overweight. Have your blood pressure checked: Every 3-5 years if you are 40-40 years of age. Every year if you are 40 years old or older. Diabetes Have regular diabetes screenings. This checks your fasting blood sugar level. Have the screening done: Once every three years after age 6 if you are at a normal weight and have a low risk for diabetes. More often and at a younger age if you are overweight or have a high risk for diabetes. What should I know about preventing infection? Hepatitis B If you have a higher risk for hepatitis B, you should be screened for this virus. Talk with your health care provider to find out if you are at risk for hepatitis B infection. Hepatitis C Testing is recommended for: Everyone born from [birth year] through 1965. Anyone with known risk factors for hepatitis C. Sexually transmitted infections (STIs) Get screened for STIs, including gonorrhea and chlamydia, if: You are sexually active and are younger than 40 years of age. You are older than 40 years of age and your health care provider tells you that you are at risk for this type of infection. Your sexual activity has changed since you were last screened, and you are at increased risk for chlamydia or gonorrhea. Ask your health care provider if you are at risk. Ask your health care provider about whether you are at high risk for HIV. Your health care provider may recommend a prescription  medicine to help prevent HIV infection. If you choose to take medicine to prevent HIV, you should first get tested for HIV. You should then be tested every 3 months for as long as you are taking the medicine. Pregnancy If you are about to stop having your period (premenopausal) and you may become pregnant, seek counseling before you get  pregnant. Take 400 to 800 micrograms (mcg) of folic acid every day if you become pregnant. Ask for birth control (contraception) if you want to prevent pregnancy. Osteoporosis and menopause Osteoporosis is a disease in which the bones lose minerals and strength with aging. This can result in bone fractures. If you are 75 years old or older, or if you are at risk for osteoporosis and fractures, ask your health care provider if you should: Be screened for bone loss. Take a calcium or vitamin D supplement to lower your risk of fractures. Be given hormone replacement therapy (HRT) to treat symptoms of menopause. Follow these instructions at home: Lifestyle Do not use any products that contain nicotine or tobacco, such as cigarettes, e-cigarettes, and chewing tobacco. If you need help quitting, ask your health care provider. Do not use street drugs. Do not share needles. Ask your health care provider for help if you need support or information about quitting drugs. Alcohol use Do not drink alcohol if: Your health care provider tells you not to drink. You are pregnant, may be pregnant, or are planning to become pregnant. If you drink alcohol: Limit how much you use to 0-1 drink a day. Limit intake if you are breastfeeding. Be aware of how much alcohol is in your drink. In the [place] one drink equals one 12 oz bottle of beer (355 mL), one 5 oz glass of wine (148 mL), or one 1 oz glass of hard liquor (44 mL). General instructions Schedule regular health, dental, and eye exams. Stay current with your vaccines. Tell your health care provider if: You often feel depressed. You have ever been abused or do not feel safe at home. Summary Adopting a healthy lifestyle and getting preventive care are important in promoting health and wellness. Follow your health care provider's instructions about healthy diet, exercising, and getting tested or screened for diseases. Follow your health care provider's  instructions on monitoring your cholesterol and blood pressure. This information is not intended to replace advice given to you by your health care provider. Make sure you discuss any questions you have with your health care provider. Document Revised: 08/06/2020 Document Reviewed: 05/22/2018 Elsevier Patient Education  2022 [place]. Diabetes Mellitus Action Plan Following a diabetes action plan is a way for you to manage your diabetes (diabetes mellitus) symptoms. The plan is color-coded to help you understand what actions you need to take based on any symptoms you are having. If you have symptoms in the red zone, you need medical care right away. If you have symptoms in the yellow zone, you are having problems. If you have symptoms in the green zone, you are doing well. Learning about and understanding diabetes can take time. Follow the plan that you develop with your health care provider. Know the target range for your blood sugar (glucose) level, and review your treatment plan with your health care provider at each visit. The target range for my blood sugar level is __________________________ mg/dL. Red zone Get medical help right away if you have any of the following symptoms: A blood sugar test result that is below 54 mg/dL (3 mmol/L). A  blood sugar test result that is at or above 240 mg/dL (13.3 mmol/L) for 2 days in a row. Confusion or trouble thinking clearly. Difficulty breathing. Sickness or a fever for 2 or more days that is not getting better. Moderate or large ketone levels in your urine. Feeling tired or having no energy. If you have any red zone symptoms, do not wait to see if the symptoms will go away. Get medical help right away. Call your local emergency services (911 in the [place] Do not drive yourself to the hospital. If you have severely low blood sugar (severe hypoglycemia) and you cannot eat or drink, you may need glucagon. Make sure a family member or close friend  knows how to check your blood sugar and how to give you glucagon. You may need to be treated in a hospital for this condition. Yellow zone If you have any of the following symptoms, your diabetes is not under control and you may need to make some changes: A blood sugar test result that is at or above 240 mg/dL (13.3 mmol/L) for 2 days in a row. Blood sugar test results that are below 70 mg/dL (3.9 mmol/L). Other symptoms of hypoglycemia, such as: Shaking or feeling light-headed. Confusion or irritability. Feeling hungry. Having a fast heartbeat. If you have any yellow zone symptoms: Treat your hypoglycemia by eating or drinking 15 grams of a rapid-acting carbohydrate. Follow the 15:15 rule: Take 15 grams of a rapid-acting carbohydrate, such as: 1 tube of glucose gel. 4 glucose pills. 4 oz (120 mL) of fruit juice. 4 oz (120 mL) of regular (not diet) soda. Check your blood sugar 15 minutes after you take the carbohydrate. If the repeat blood sugar test is still at or below 70 mg/dL (3.9 mmol/L), take 15 grams of a carbohydrate again. If your blood sugar does not increase above 70 mg/dL (3.9 mmol/L) after 3 tries, get medical help right away. After your blood sugar returns to normal, eat a meal or a snack within 1 hour. Keep taking your daily medicines as told by your health care provider. Check your blood sugar more often than you normally would. Write down your results. Call your health care provider if you have trouble keeping your blood sugar in your target range.  Green zone These signs mean you are doing well and you can continue what you are doing to manage your diabetes: Your blood sugar is within your personal target range. For most people, a blood sugar level before a meal (preprandial) should be 80-130 mg/dL (4.4-7.2 mmol/L). You feel well, and you are able to do daily activities. If you are in the green zone, continue to manage your diabetes as told by your health care  provider. To do this: Eat a healthy diet. Exercise regularly. Check your blood sugar as told by your health care provider. Take your medicines as told by your health care provider.  Where to find more information American Diabetes Association (ADA): diabetes.org Association of Diabetes Care & Education Specialists (ADCES): diabeteseducator.org Summary Following a diabetes action plan is a way for you to manage your diabetes symptoms. The plan is color-coded to help you understand what actions you need to take based on any symptoms you are having. Follow the plan that you develop with your health care provider. Make sure you know your personal target blood sugar level. Review your treatment plan with your health care provider at each visit. This information is not intended to replace advice given to you by  your health care provider. Make sure you discuss any questions you have with your health care provider. Document Revised: 12/04/2019 Document Reviewed: 12/04/2019 Elsevier Patient Education  [place].

## 2021-04-04 NOTE — Progress Notes (Signed)
Subjective:     Stacie Cardenas is a 40 y.o. female and is here for a comprehensive physical exam. The patient reports  she stopped all her medications for the last 2 months. She is not checking her sugars. She is not eating well or exercising. Denies any f open sores or wounds. She doe not feel good. Her middle left DIP and PIP hurt and worsening. No injury. No clicking. Worse with use.   Social History   Socioeconomic History   Marital status: Married    Spouse name: Not on file   Number of children: Not on file   Years of education: Not on file   Highest education level: Not on file  Occupational History   Not on file  Tobacco Use   Smoking status: Never   Smokeless tobacco: Never   Tobacco comments:    never used tobacco  Substance and Sexual Activity   Alcohol use: No    Alcohol/week: 0.0 standard drinks   Drug use: No   Sexual activity: Yes    Birth control/protection: Surgical  Other Topics Concern   Not on file  Social History Narrative   Not on file   Social Determinants of Health   Financial Resource Strain: Not on file  Food Insecurity: Not on file  Transportation Needs: Not on file  Physical Activity: Not on file  Stress: Not on file  Social Connections: Not on file  Intimate Partner Violence: Not on file   Health Maintenance  Topic Date Due   FOOT EXAM  03/20/2019   COVID-19 Vaccine (1) 04/20/2021 (Originally 07/09/1981)   OPHTHALMOLOGY EXAM  06/02/2021   HEMOGLOBIN A1C  10/03/2021   TETANUS/TDAP  03/14/2022   PAP SMEAR-Modifier  12/27/2023   Pneumococcal Vaccine 18-40 Years old (4 - PPSV23 if available, else PCV20) 01/06/2046   INFLUENZA VACCINE  Completed   Hepatitis C Screening  Completed   HIV Screening  Completed   HPV VACCINES  Aged Out    The following portions of the patient's history were reviewed and updated as appropriate: allergies, current medications, past family history, past medical history, past social history, past surgical  history, and problem list.  Review of Systems Pertinent items noted in HPI and remainder of comprehensive ROS otherwise negative.   Objective:    BP (!) 160/90   Pulse 86   Ht 5' 4"  (1.626 m)   Wt 209 lb (94.8 kg)   SpO2 96%   BMI 35.87 kg/m  General appearance: alert, cooperative, appears stated age, and moderately obese Head: Normocephalic, without obvious abnormality, atraumatic Eyes: conjunctivae/corneas clear. PERRL, EOM's intact. Fundi benign. Ears: normal TM's and external ear canals both ears Nose: Nares normal. Septum midline. Mucosa normal. No drainage or sinus tenderness. Throat: lips, mucosa, and tongue normal; teeth and gums normal Neck: no adenopathy, no carotid bruit, no JVD, supple, symmetrical, trachea midline, and thyroid not enlarged, symmetric, no tenderness/mass/nodules Back: symmetric, no curvature. ROM normal. No CVA tenderness. Lungs: clear to auscultation bilaterally Breasts: normal appearance, no masses or tenderness Heart: regular rate and rhythm, S1, S2 normal, no murmur, click, rub or gallop Abdomen: soft, non-tender; bowel sounds normal; no masses,  no organomegaly Extremities: extremities normal, atraumatic, no cyanosis or edema Pulses: 2+ and symmetric Skin: Skin color, texture, turgor normal. No rashes or lesions Lymph nodes: Cervical, supraclavicular, and axillary nodes normal. Neurologic: Alert and oriented X 3, normal strength and tone. Normal symmetric reflexes. Normal coordination and gait   .Marland Kitchen  Depression screen [facility] 2/9 04/04/2021 12/30/2018 11/07/2017 09/24/2013 06/23/2013  Decreased Interest 0 0 0 0 0  Down, Depressed, Hopeless 0 0 0 0 0  PHQ - 2 Score 0 0 0 0 0  Altered sleeping 0 - - - -  Tired, decreased energy 0 - - - -  Change in appetite 0 - - - -  Feeling bad or failure about yourself  0 - - - -  Trouble concentrating 0 - - - -  Moving slowly or fidgety/restless 0 - - - -  Suicidal thoughts 0 - - - -  PHQ-9 Score 0 - - - -   Difficult doing work/chores Not difficult at all - - - -   .Marland Kitchen GAD 7 : Generalized Anxiety Score 04/04/2021  Nervous, Anxious, on Edge 0  Control/stop worrying 0  Worry too much - different things 0  Trouble relaxing 0  Restless 0  Easily annoyed or irritable 0  Afraid - awful might happen 0  Total GAD 7 Score 0  Anxiety Difficulty Not difficult at all     Assessment:    Healthy female exam.     Plan:  Marland KitchenMarland KitchenSherleen was seen today for annual exam.  Diagnoses and all orders for this visit:  Uncontrolled type 2 diabetes mellitus with hyperglycemia (HCC) -     COMPLETE METABOLIC PANEL WITH GFR -     Hemoglobin A1c -     Urine Microalbumin w/creat. ratio -     empagliflozin (JARDIANCE) 10 MG TABS tablet; Take 1 tablet (10 mg total) by mouth daily. -     Semaglutide,0.25 or 0.5MG/DOS, (OZEMPIC, 0.25 OR 0.5 MG/DOSE,) 2 MG/1.5ML SOPN; Inject 0.5 mg as directed once a week.  Anemia, unspecified type -     CBC with Differential/Platelet  Dyslipidemia (high LDL; low HDL) -     Lipid Panel w/reflex Direct LDL -     simvastatin (ZOCOR) 40 MG tablet; Take 1 tablet (40 mg total) by mouth every evening.  HYPERTENSION, BENIGN ESSENTIAL -     COMPLETE METABOLIC PANEL WITH GFR -     losartan (COZAAR) 25 MG tablet; Take 1 tablet (25 mg total) by mouth daily.  Thyroid disorder screen -     TSH  Vitamin D deficiency -     Vitamin D (25 hydroxy)  Arthralgia, unspecified joint -     Cyclic citrul peptide antibody, IgG -     Rheumatoid factor -     DG Finger Middle Left; Future  Crohn's disease of small intestine without complication (HCC) -     Fe+TIBC+Fer  Nausea -     ondansetron (ZOFRAN) 8 MG tablet; Take 1 tablet (8 mg total) by mouth every 8 (eight) hours as needed for nausea or vomiting. -     ondansetron (ZOFRAN ODT) 8 MG disintegrating tablet; Take 1 tablet (8 mg total) by mouth every 8 (eight) hours as needed for nausea or vomiting.  Pt must start back on medication. Long  conversation about the risk of not controlling sugars and intestines.  Fasting labs ordered today.  Restart lipitor.  Start losartan due to BP increase. Follow up in 1 month.  Remember the DM diet and regular exercise.  .. Diabetic Foot Exam - Simple   Simple Foot Form  04/04/2021  7:45 AM  Visual Inspection No deformities, no ulcerations, no other skin breakdown bilaterally: Yes Sensation Testing Intact to touch and monofilament testing bilaterally: Yes Pulse Check Posterior Tibialis and Dorsalis pulse intact bilaterally:  Yes Comments    Declined covid vaccine.  Pneumonia vaccine UTD.  Flu shot UTD.  Pap UTD.  Mammogram ordered.  Eye exam UTD.   Xray of middle fingers and added RA panel for testing.     See After Visit Summary for Counseling Recommendations

## 2021-04-05 ENCOUNTER — Encounter: Payer: Self-pay | Admitting: Physician Assistant

## 2021-04-05 ENCOUNTER — Other Ambulatory Visit: Payer: Self-pay | Admitting: Physician Assistant

## 2021-04-05 DIAGNOSIS — R110 Nausea: Secondary | ICD-10-CM | POA: Insufficient documentation

## 2021-04-05 DIAGNOSIS — M2550 Pain in unspecified joint: Secondary | ICD-10-CM | POA: Insufficient documentation

## 2021-04-05 DIAGNOSIS — R809 Proteinuria, unspecified: Secondary | ICD-10-CM

## 2021-04-05 DIAGNOSIS — E1165 Type 2 diabetes mellitus with hyperglycemia: Secondary | ICD-10-CM | POA: Insufficient documentation

## 2021-04-05 LAB — COMPLETE METABOLIC PANEL WITH GFR
AG Ratio: 1.1 (calc) (ref 1.0–2.5)
ALT: 22 U/L (ref 6–29)
AST: 14 U/L (ref 10–30)
Albumin: 4 g/dL (ref 3.6–5.1)
Alkaline phosphatase (APISO): 64 U/L (ref 31–125)
BUN: 8 mg/dL (ref 7–25)
CO2: 26 mmol/L (ref 20–32)
Calcium: 9.3 mg/dL (ref 8.6–10.2)
Chloride: 99 mmol/L (ref 98–110)
Creat: 0.62 mg/dL (ref 0.50–0.99)
Globulin: 3.6 g/dL (calc) (ref 1.9–3.7)
Glucose, Bld: 268 mg/dL — ABNORMAL HIGH (ref 65–99)
Potassium: 4 mmol/L (ref 3.5–5.3)
Sodium: 135 mmol/L (ref 135–146)
Total Bilirubin: 0.4 mg/dL (ref 0.2–1.2)
Total Protein: 7.6 g/dL (ref 6.1–8.1)
eGFR: 115 mL/min/{1.73_m2} (ref >=60)

## 2021-04-05 LAB — CBC WITH DIFFERENTIAL/PLATELET
Absolute Monocytes: 419 cells/uL (ref 200–950)
Basophils Absolute: 43 cells/uL (ref 0–200)
Basophils Relative: 0.6 %
Eosinophils Absolute: 57 cells/uL (ref 15–500)
Eosinophils Relative: 0.8 %
HCT: 43.6 % (ref 35.0–45.0)
Hemoglobin: 13.8 g/dL (ref 11.7–15.5)
Lymphs Abs: 3259 cells/uL (ref 850–3900)
MCH: 27.1 pg (ref 27.0–33.0)
MCHC: 31.7 g/dL — ABNORMAL LOW (ref 32.0–36.0)
MCV: 85.5 fL (ref 80.0–100.0)
MPV: 12.3 fL (ref 7.5–12.5)
Monocytes Relative: 5.9 %
Neutro Abs: 3323 cells/uL (ref 1500–7800)
Neutrophils Relative %: 46.8 %
Platelets: 224 10*3/uL (ref 140–400)
RBC: 5.1 10*6/uL (ref 3.80–5.10)
RDW: 12.6 % (ref 11.0–15.0)
Total Lymphocyte: 45.9 %
WBC: 7.1 10*3/uL (ref 3.8–10.8)

## 2021-04-05 LAB — MICROALBUMIN / CREATININE URINE RATIO
Creatinine, Urine: 153 mg/dL (ref 20–275)
Microalb Creat Ratio: 38 mcg/mg creat — ABNORMAL HIGH (ref <30)
Microalb, Ur: 5.8 mg/dL

## 2021-04-05 LAB — IRON,TIBC AND FERRITIN PANEL
%SAT: 16 % (calc) (ref 16–45)
Ferritin: 56 ng/mL (ref 16–154)
Iron: 50 ug/dL (ref 40–190)
TIBC: 309 mcg/dL (calc) (ref 250–450)

## 2021-04-05 LAB — LIPID PANEL W/REFLEX DIRECT LDL
Cholesterol: 295 mg/dL — ABNORMAL HIGH (ref <200)
HDL: 65 mg/dL (ref >=50)
LDL Cholesterol (Calc): 202 mg/dL (calc) — ABNORMAL HIGH
Non-HDL Cholesterol (Calc): 230 mg/dL (calc) — ABNORMAL HIGH (ref <130)
Total CHOL/HDL Ratio: 4.5 (calc) (ref <5.0)
Triglycerides: 131 mg/dL (ref <150)

## 2021-04-05 LAB — HEMOGLOBIN A1C
Hgb A1c MFr Bld: 11.3 % of total Hgb — ABNORMAL HIGH (ref <5.7)
Mean Plasma Glucose: 278 mg/dL
eAG (mmol/L): 15.4 mmol/L

## 2021-04-05 LAB — CYCLIC CITRUL PEPTIDE ANTIBODY, IGG: Cyclic Citrullin Peptide Ab: <16 UNITS

## 2021-04-05 LAB — RHEUMATOID FACTOR: Rheumatoid fact SerPl-aCnc: <14 IU/mL (ref <14)

## 2021-04-05 LAB — TSH: TSH: 1.02 mIU/L

## 2021-04-05 LAB — VITAMIN D 25 HYDROXY (VIT D DEFICIENCY, FRACTURES): Vit D, 25-Hydroxy: 17 ng/mL — ABNORMAL LOW (ref 30–100)

## 2021-04-05 MED ORDER — VITAMIN D (ERGOCALCIFEROL) 1.25 MG (50000 UNIT) PO CAPS: 50000.0000 [IU] | ORAL_CAPSULE | ORAL | 1 refills | Status: DC

## 2021-04-05 NOTE — Progress Notes (Signed)
No significant arthritis found in left middle finger.

## 2021-04-05 NOTE — Addendum Note (Signed)
Addended by: [name] on: 04/05/2021 10:03 AM   Modules accepted: Orders

## 2021-04-05 NOTE — Progress Notes (Signed)
Jaanai,   Thyroid looks great.  A1C is terribly high at 11.3. you must start all your medications and [place] get you back controlled again. Recheck in 3 months.  Vitamin d is very low. Start high dose weekly for next 6 months then recheck.  You have started to spill some protein in urine. This is what happens when sugars get elevated and this is can do damage to the kidneys. Losartan can help with BP and kidney protection.  Iron levels are still in normal range.  Kidney, liver still look good in labs.  Cholesterol is really elevated.  Try zocor but you might need something stronger. Recheck in 6 months.

## 2021-04-06 ENCOUNTER — Telehealth: Payer: Self-pay | Admitting: *Deleted

## 2021-04-06 NOTE — Chronic Care Management (AMB) (Signed)
  Chronic Care Management   Note  04/06/2021 Name: Stacie Cardenas MRN: [id number] DOB: 1981/02/11  Stacie Cardenas is a 40 y.o. year old female who is a primary care patient of [name], [place]. I reached out to Stacie Cardenas by phone today in response to a referral sent by [name]'s PCP.  [name] was given information about Chronic Care Management services today including:  CCM service includes personalized support from designated clinical staff supervised by her physician, including individualized plan of care and coordination with other care providers 24/7 contact phone numbers for assistance for urgent and routine care needs. Service will only be billed when office clinical staff spend 20 minutes or more in a month to coordinate care. Only one practitioner may furnish and bill the service in a calendar month. The patient may stop CCM services at any time (effective at the end of the month) by phone call to the office staff. The patient is responsible for co-pay (up to 20% after annual deductible is met) if co-pay is required by the individual health plan.   Patient agreed to services and verbal consent obtained.   Follow up plan: Face to Face appointment with care management team member scheduled for:  04/20/2021  Julian Hy, [place] [facility]  Direct Dial: [phone]

## 2021-04-13 ENCOUNTER — Telehealth: Payer: Self-pay | Admitting: Physician Assistant

## 2021-04-13 NOTE — Telephone Encounter (Signed)
Make sure she is taking the losartan 52m if she is then increase to 542mand keep checking BP and report readings on Friday.

## 2021-04-13 NOTE — Telephone Encounter (Signed)
Pt was here on Monday and with new med her BP is running 164/120, 144/116, 156/106 .... What do you advise her to do? Please call [phone]

## 2021-04-13 NOTE — Telephone Encounter (Signed)
Make sure she is not having any cardiac symptoms.

## 2021-04-13 NOTE — Telephone Encounter (Signed)
Spoke with patient, she states she has had a headache since yesterday but no shortness of breath, palpitations or chest pain.   She is currently taking Losartan 25 mg with no missed doses. She will double this and start 50 mg daily. She will let us know Monday what her readings are. If she develops any other symptoms (SOB, chest pain, palpitations) aware she should seek care in the ER.

## 2021-04-15 ENCOUNTER — Encounter: Payer: Self-pay | Admitting: Physician Assistant

## 2021-04-15 ENCOUNTER — Ambulatory Visit (INDEPENDENT_AMBULATORY_CARE_PROVIDER_SITE_OTHER): Payer: No Typology Code available for payment source | Admitting: Physician Assistant

## 2021-04-15 ENCOUNTER — Other Ambulatory Visit: Payer: Self-pay

## 2021-04-15 VITALS — BP 130/84 | HR 93

## 2021-04-15 DIAGNOSIS — H938X2 Other specified disorders of left ear: Secondary | ICD-10-CM | POA: Diagnosis not present

## 2021-04-15 DIAGNOSIS — H9192 Unspecified hearing loss, left ear: Secondary | ICD-10-CM

## 2021-04-15 DIAGNOSIS — H9012 Conductive hearing loss, unilateral, left ear, with unrestricted hearing on the contralateral side: Secondary | ICD-10-CM | POA: Diagnosis not present

## 2021-04-15 DIAGNOSIS — R519 Headache, unspecified: Secondary | ICD-10-CM | POA: Diagnosis not present

## 2021-04-15 DIAGNOSIS — I10 Essential (primary) hypertension: Secondary | ICD-10-CM | POA: Diagnosis not present

## 2021-04-15 MED ORDER — DEXAMETHASONE SODIUM PHOSPHATE 10 MG/ML IJ SOLN
8.0000 mg | Freq: Once | INTRAMUSCULAR | Status: AC
Start: 2021-04-15 — End: 2021-04-15
  Administered 2021-04-15: 8 mg via INTRAMUSCULAR

## 2021-04-15 MED ORDER — KETOROLAC TROMETHAMINE 60 MG/2ML IM SOLN
60.0000 mg | Freq: Once | INTRAMUSCULAR | Status: AC
  Administered 2021-04-15: 60 mg via INTRAMUSCULAR

## 2021-04-15 MED ORDER — FLUTICASONE PROPIONATE 50 MCG/ACT NA SUSP: 2.0000 | Freq: Every day | NASAL | 1 refills | Status: AC

## 2021-04-15 NOTE — Patient Instructions (Signed)
Eustachian Tube Dysfunction °Eustachian tube dysfunction refers to a condition in which a blockage develops in the narrow passage that connects the middle ear to the back of the nose (eustachian tube). The eustachian tube regulates air pressure in the middle ear by letting air move between the ear and nose. It also helps to drain fluid from the middle ear space. °Eustachian tube dysfunction can affect one or both ears. When the eustachian tube does not function properly, air pressure, fluid, or both can build up in the middle ear. °What are the causes? °This condition occurs when the eustachian tube becomes blocked or cannot open normally. Common causes of this condition include: °Ear infections. °Colds and other infections that affect the nose, mouth, and throat (upper respiratory tract). °Allergies. °Irritation from cigarette smoke. °Irritation from stomach acid coming up into the esophagus (gastroesophageal reflux). The esophagus is the part of the body that moves food from the mouth to the stomach. °Sudden changes in air pressure, such as from descending in an airplane or scuba diving. °Abnormal growths in the nose or throat, such as: °Growths that line the nose (nasal polyps). °Abnormal growth of cells (tumors). °Enlarged tissue at the back of the throat (adenoids). °What increases the risk? °You are more likely to develop this condition if: °You smoke. °You are overweight. °You are a child who has: °Certain birth defects of the mouth, such as cleft palate. °Large tonsils or adenoids. °What are the signs or symptoms? °Common symptoms of this condition include: °A feeling of fullness in the ear. °Ear pain. °Clicking or popping noises in the ear. °Ringing in the ear (tinnitus). °Hearing loss. °Loss of balance. °Dizziness. °Symptoms may get worse when the air pressure around you changes, such as when you travel to an area of high elevation, fly on an airplane, or go scuba diving. °How is this diagnosed? °This  condition may be diagnosed based on: °Your symptoms. °A physical exam of your ears, nose, and throat. °Tests, such as those that measure: °The movement of your eardrum. °Your hearing (audiometry). °How is this treated? °Treatment depends on the cause and severity of your condition. °In mild cases, you may relieve your symptoms by moving air into your ears. This is called "popping the ears." °In more severe cases, or if you have symptoms of fluid in your ears, treatment may include: °Medicines to relieve congestion (decongestants). °Medicines that treat allergies (antihistamines). °Nasal sprays or ear drops that contain medicines that reduce swelling (steroids). °A procedure to drain the fluid in your eardrum. In this procedure, a small tube may be placed in the eardrum to: °Drain the fluid. °Restore the air in the middle ear space. °A procedure to insert a balloon device through the nose to inflate the opening of the eustachian tube (balloon dilation). °Follow these instructions at home: °Lifestyle °Do not do any of the following until your health care provider approves: °Travel to high altitudes. °Fly in airplanes. °Work in a pressurized cabin or room. °Scuba dive. °Do not use any products that contain nicotine or tobacco. These products include cigarettes, chewing tobacco, and vaping devices, such as e-cigarettes. If you need help quitting, ask your health care provider. °Keep your ears dry. Wear fitted earplugs during showering and bathing. Dry your ears completely after. °General instructions °Take over-the-counter and prescription medicines only as told by your health care provider. °Use techniques to help pop your ears as recommended by your health care provider. These may include: °Chewing gum. °Yawning. °Frequent, forceful swallowing. °Closing   your mouth, holding your nose closed, and gently blowing as if you are trying to blow air out of your nose. °Keep all follow-up visits. This is important. °Contact a  health care provider if: °Your symptoms do not go away after treatment. °Your symptoms come back after treatment. °You are unable to pop your ears. °You have: °A fever. °Pain in your ear. °Pain in your head or neck. °Fluid draining from your ear. °Your hearing suddenly changes. °You become very dizzy. °You lose your balance. °Get help right away if: °You have a sudden, severe increase in any of your symptoms. °Summary °Eustachian tube dysfunction refers to a condition in which a blockage develops in the eustachian tube. °It can be caused by ear infections, allergies, inhaled irritants, or abnormal growths in the nose or throat. °Symptoms may include ear pain or fullness, hearing loss, or ringing in the ears. °Mild cases are treated with techniques to unblock the ears, such as yawning or chewing gum. °More severe cases are treated with medicines or procedures. °This information is not intended to replace advice given to you by your health care provider. Make sure you discuss any questions you have with your health care provider. °Document Revised: 08/09/2020 Document Reviewed: 08/09/2020 °Elsevier Patient Education © 2022 [employer]vier Inc. ° °

## 2021-04-18 ENCOUNTER — Telehealth: Payer: Self-pay | Admitting: Pharmacist

## 2021-04-18 DIAGNOSIS — R519 Headache, unspecified: Secondary | ICD-10-CM | POA: Insufficient documentation

## 2021-04-18 DIAGNOSIS — H9012 Conductive hearing loss, unilateral, left ear, with unrestricted hearing on the contralateral side: Secondary | ICD-10-CM | POA: Insufficient documentation

## 2021-04-18 NOTE — Progress Notes (Signed)
L 

## 2021-04-18 NOTE — Chronic Care Management (AMB) (Signed)
    Chronic Care Management Pharmacy Assistant   Name: KEIRSTYN AYDT  MRN: [id number] DOB: [birth year]  TONILYNN BIEKER is an 40 y.o. year old female who presents for his initial CCM visit with the clinical pharmacist.  Recent office visits:  04/15/21-Jade L. Alden Hipp, PA-C (PCP) General follow up visit. Start  fluticasone (FLONASE) 50 MCG/ACT nasal spray; Place 2 sprays into both nostrils daily. Dexamethasone IM given in office, and HA-migraine cocktail given. Follow up in 1 week. 04/04/21-Jade L. Alden Hipp PA-C (PCP) Seen for physical exam. Labs ordered. Patient must restart on blood sugar medications. Restart on Lipitor. Start losartan due to BP increase. Follow up in 1 month. Patient declined COVID vaccine. Mammogram ordered. X-rays ordered of middle fingers. Follow up in 3 months.  Recent consult visits:  03/10/21-[facility]. (Gastroenterology) Notes not available.   Hospital visits:  None in previous 6 months  Medications: Outpatient Encounter Medications as of 04/18/2021  Medication Sig   empagliflozin (JARDIANCE) 10 MG TABS tablet Take 1 tablet (10 mg total) by mouth daily.   fluticasone (FLONASE) 50 MCG/ACT nasal spray Place 2 sprays into both nostrils daily.   ondansetron (ZOFRAN ODT) 8 MG disintegrating tablet Take 1 tablet (8 mg total) by mouth every 8 (eight) hours as needed for nausea or vomiting.   Semaglutide,0.25 or 0.5MG/DOS, (OZEMPIC, 0.25 OR 0.5 MG/DOSE,) 2 MG/1.5ML SOPN Inject 0.5 mg as directed once a week.   simvastatin (ZOCOR) 40 MG tablet Take 1 tablet (40 mg total) by mouth every evening.   Vitamin D, Ergocalciferol, (DRISDOL) 1.25 MG (50000 UNIT) CAPS capsule Take 1 capsule (50,000 Units total) by mouth every 7 (seven) days.   [DISCONTINUED] losartan (COZAAR) 25 MG tablet Take 1 tablet (25 mg total) by mouth daily.   No facility-administered encounter medications on file as of 04/18/2021.   Empagliflozin (JARDIANCE) 10 MG TABS tablet Last  filled:04/06/21 90 DS Fluticasone (FLONASE) 50 MCG/ACT nasal spray Last filled:None noted Ondansetron (ZOFRAN ODT) 8 MG disintegrating tablet Last filled:04/04/21 6 DS Semaglutide,0.25 or 0.5MG/DOS, (OZEMPIC, 0.25 OR 0.5 MG/DOSE,) 2 MG/1.5ML SOPN Last filled:04/06/21 21 DS Simvastatin (ZOCOR) 40 MG tablet Last filled:04/04/21 90 DS Vitamin D, Ergocalciferol, (DRISDOL) 1.25 MG (50000 UNIT) CAPS capsule Last filled:None noted   Care Gaps: Mammogram over due scheduled for 11/059/22  Star Rating Drugs: Ozempic 0.5 mg Last filled: Simvastatin 40 mg Last filled:  Corrie Mckusick, [place]

## 2021-04-18 NOTE — Progress Notes (Signed)
Subjective:    Patient ID: Stacie Cardenas, female    DOB: Dec 20, 1980, 40 y.o.   MRN: [id number]  HPI Pt is a 40 yo obese female with T2DM, chrons disease, HTN, dyslipidemia who presents to the clinic to follow up on HTN and left ear pain/pressure.   She went off medication and BP was elevated. Restarted medication and been slowly monitoring BP for control. She restarted losaartan. No CP, palpitations. She is having a headache for 2 days and left ear pain and pressure. No fever, chills, sinus pressure, ST, cough, SOB. She is struggling to hear.    .. Active Ambulatory Problems    Diagnosis Date Noted   Dyslipidemia, goal LDL below 70 04/27/2010   Iron deficiency anemia 01/21/2010   HYPERTENSION, BENIGN ESSENTIAL 04/26/2006   WEIGHT GAIN 11/13/2008   MIGRAINE HEADACHE 06/28/2010   Menorrhagia 07/03/2013   Crohn's disease ([place]) 03/30/2014   Type 2 diabetes mellitus, controlled ([place]) 06/08/2014   Insomnia 06/20/2015   Absolute anemia 09/07/2015   Vitamin D deficiency 10/03/2016   Morbidly obese ([place]) 10/20/2016   Chronic fatigue syndrome 02/18/2017   Chronic left-sided low back pain without sciatica 07/08/2017   Anxiety 07/08/2017   Hidradenitis suppurativa 11/09/2017   Hyperpigmentation of skin, postinflammatory 11/09/2017   Acanthosis nigricans 11/09/2017   Dyslipidemia (high LDL; low HDL) 03/20/2018   No energy 12/30/2018   Breast pain, right 12/30/2018   Abscess of left elbow 07/03/2019   Abscess 01/27/2020   COVID-19 virus infection 01/27/2020   Anemia 01/27/2020   Microalbuminuria 04/05/2021   Uncontrolled type 2 diabetes mellitus with hyperglycemia ([place]) 04/05/2021   Arthralgia 04/05/2021   Nausea 04/05/2021   Ear pressure, left 04/15/2021   Conductive hearing loss 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 04/18/2021   Acute nonintractable headache 04/18/2021   Resolved Ambulatory Problems    Diagnosis Date Noted   ABSCESS OF BARTHOLINS GLAND 02/19/2009    CARBUNCLE/FURUNCLE NOS 04/26/2006   SKIN RASH 11/13/2008   SYMPTOM, FREQUENCY, URINARY 04/26/2006   Abdominal pain, epigastric 04/27/2010   Impaired fasting glucose 40/03/2724   NEOPLASM UNCERTAIN BHV OTH&UNSPEC FE GENIT ORGN 08/10/2010   VAGINAL DISCHARGE 36/64/4034   FOLLICULITIS 74/25/9563   SORE THROAT 01/10/2011   ABDOMINAL PAIN 01/10/2011   Hyperlipidemia 10/14/2012   Obesity, unspecified 10/14/2012   Other iron deficiency anemias 03/17/2013   Elevated hemoglobin A1c 05/11/2014   Abscess of left axilla 12/31/2014   Bilateral lower abdominal pain 09/07/2015   Ruptured ear drum, right 08/29/2017   Past Medical History:  Diagnosis Date   Hypercholesterolemia    Hypertension    IBS (irritable bowel syndrome)    Other specified iron deficiency anemias 03/17/2013    Review of Systems See HPI.     Objective:   Physical Exam Vitals reviewed.  Constitutional:      Appearance: [name]. She is obese.  HENT:     Head: Normocephalic.     Mouth/Throat:     Mouth: Mucous membranes are moist.  Eyes:     General: No visual field deficit or scleral icterus.    Extraocular Movements: Extraocular movements intact.     Right eye: Normal extraocular motion and no nystagmus.     Left eye: Normal extraocular motion and no nystagmus.     Pupils: Pupils are equal, round, and reactive to light. Pupils are equal.  Cardiovascular:     Rate and Rhythm: Normal rate and regular rhythm.  Pulmonary:  Effort: Pulmonary effort is normal.     Breath sounds: Normal breath sounds.  Abdominal:     General: Bowel sounds are normal.     Palpations: Abdomen is soft.  Musculoskeletal:     Cervical back: Normal range of motion and neck supple.  Neurological:     Mental Status: She is alert and oriented to person, place, and time.     Cranial Nerves: No cranial nerve deficit or facial asymmetry.     Sensory: No sensory deficit.     Motor: No weakness.     Coordination: Romberg sign  negative. Coordination normal.     Gait: Gait normal.  Psychiatric:        Mood and Affect: Mood normal.          Assessment & Plan:  Marland KitchenMarland KitchenStuart was seen today for hypertension and headache.  Diagnoses and all orders for this visit:  Primary hypertension  Ear pressure, left -     fluticasone (FLONASE) 50 MCG/ACT nasal spray; Place 2 sprays into both nostrils daily. -     ketorolac (TORADOL) injection 60 mg -     dexamethasone (DECADRON) injection 8 mg  Acute nonintractable headache, unspecified headache type -     fluticasone (FLONASE) 50 MCG/ACT nasal spray; Place 2 sprays into both nostrils daily. -     ketorolac (TORADOL) injection 60 mg -     dexamethasone (DECADRON) injection 8 mg  Conductive hearing loss 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 -     fluticasone (FLONASE) 50 MCG/ACT nasal spray; Place 2 sprays into both nostrils daily. -     ketorolac (TORADOL) injection 60 mg -     dexamethasone (DECADRON) injection 8 mg  BP much better. Stay on losartan 40m daily.  Recheck in 1 week.   No bacterial infection in left ear. Fluid behind TM.  Start flonase.  Dexamethasone IM in office today.  HO on eustachian tube dysfunction.   HA-migraine cocktail given. Does not looked to be due to BP.   Follow up in 1 week.

## 2021-04-20 ENCOUNTER — Other Ambulatory Visit: Payer: Self-pay

## 2021-04-20 ENCOUNTER — Ambulatory Visit (INDEPENDENT_AMBULATORY_CARE_PROVIDER_SITE_OTHER): Payer: No Typology Code available for payment source

## 2021-04-20 ENCOUNTER — Ambulatory Visit: Payer: No Typology Code available for payment source | Admitting: Pharmacist

## 2021-04-20 DIAGNOSIS — I10 Essential (primary) hypertension: Secondary | ICD-10-CM

## 2021-04-20 DIAGNOSIS — Z1231 Encounter for screening mammogram for malignant neoplasm of breast: Secondary | ICD-10-CM

## 2021-04-20 DIAGNOSIS — E785 Hyperlipidemia, unspecified: Secondary | ICD-10-CM

## 2021-04-20 DIAGNOSIS — E1165 Type 2 diabetes mellitus with hyperglycemia: Secondary | ICD-10-CM

## 2021-04-20 NOTE — Patient Instructions (Signed)
Visit Information   PATIENT GOALS/PLAN OF CARE:  Care Plan : Medication Management  Updates made by [name], RPH since 04/20/2021 12:00 AM     Problem: DM, HTN, HLD      Long-Range Goal: Disease Progression Prevention   Start Date: 04/20/2021  This Visit's Progress: On track  Priority: High  Note:   Current Barriers:  Unable to achieve control of diabetes, HLD  Does not adhere to prescribed medication regimen  Pharmacist Clinical Goal(s):  Over the next 14 days, patient will adhere to plan to optimize therapeutic regimen for diabetes and hyperlipidemia as evidenced by report of adherence to recommended medication management changes through collaboration with PharmD and provider.   Interventions: 1:1 collaboration with [name], PA-C regarding development and update of comprehensive plan of care as evidenced by provider attestation and co-signature Inter-disciplinary care team collaboration (see longitudinal plan of care) Comprehensive medication review performed; medication list updated in electronic medical record  Diabetes:   Controlled; current treatment:jardiance 76m daily, ozempic 0.51mweekly; a1c 11.3, patient states she had covid August & December 2021 and fell off track/stopped taking medications  Current glucose readings:not currently checking  Reports hyperglycemic symptoms  Current meal patterns: breakfast: bacon, eggs, toast, oatmeal + fruit; lunch: grilled BBQ chicken & corn; dinner: air fryer or grill food, salmon, peas, broccoli; snacks: jello w/fruit, nuts (careful w/ crohns), chips, cheezits, "bad w/ sweets"; drinks: dr pepper ~1 per day but not daily, water,   Current exercise: walks  Counseled on mechanism of action of medications, importance of proper glycemic control and downstream effects Recommended continue ozempic 0.22m222meekly for 2 more doses (=4 total doses), then if tolerating will plan to titrate to ozempic 1mg87mekly x4. ,   Hypertension:  Controlled; current treatment:losartan 50mg29mly (recently increased to 50mg 64mast PCP visit);   Current home readings: SBP 120-130s  Denies hypotensive/hypertensive symptoms  Recommended continue current regimen Hyperlipidemia:  Uncontrolled; current treatment:simvastatin 40mg d43m;   Recommended switch to rosuvastatin 10mg da622m titrate up dose as tolerated   Patient Goals/Self-Care Activities Over the next 14 days, patient will:  take medications as prescribed and check glucose via CGM, document, and provide at future appointments  Follow Up Plan: Telephone follow up appointment with care management team member scheduled for:  2 weeks      Consent to CCM Services: Ms. Schoening waOliverien information about Chronic Care Management services including:  CCM service includes personalized support from designated clinical staff supervised by her physician, including individualized plan of care and coordination with other care providers 24/7 contact phone numbers for assistance for urgent and routine care needs. Service will only be billed when office clinical staff spend 20 minutes or more in a month to coordinate care. Only one practitioner may furnish and bill the service in a calendar month. The patient may stop CCM services at any time (effective at the end of the month) by phone call to the office staff. The patient will be responsible for cost sharing (co-pay) of up to 20% of the service fee (after annual deductible is met).  Patient agreed to services and verbal consent obtained.   Patient verbalizes understanding of instructions provided today and agrees to view in MyChart.Tampaphone follow up appointment with care management team member scheduled for: 2 weeks  Endi Lagman JDarius Bump

## 2021-04-20 NOTE — Progress Notes (Signed)
Care Management   Pharmacy Note  04/20/2021 Name: Stacie Cardenas MRN: [id number] DOB: 1980/10/22   Summary: addressed HTN, HLD, and primarily DM. Counseled patient on medications, ozempic injection technique, and Freestyle Libre 3 CGM usage. Applied [employer] 3 sensor at today's visit, reviewed what to expect.  Recommendations/Changes made from today's visit: - begin glucose monitoring with Freestyle Libre 3, RX needs send to [facility] - Complete 4 total doses of ozempic 0.50m weekly, then increase to 153mweekly (RX for Ozempic 31m56mo pharmacy) - Consider change to rosuvastatin 43m109mily w/goal of titrating to high intensity (20-40mg29m tolerated, stop simvastatin.  - Continue losartan 50mg 78my, will need new RX when taking two of the 25mg t77mts runs out.  Plan: f/u with pharmacist in 2 weeks to assess ozempic tolerance & review CGM numbers.  Subjective: Stacie MANALI CABANILLA40 y.o.21ear old female who is a primary care patient of BreebacDonella Stade The Care Management team was consulted for assistance with care management and care coordination needs.     Engaged with patient face to face for initial visit in response to provider referral for pharmacy case management and/or care coordination services.   The patient was given information about Care Management services today including:  Care Management services includes personalized support from designated clinical staff supervised by the patient's primary care provider, including individualized plan of care and coordination with other care providers. 24/7 contact phone numbers for assistance for urgent and routine care needs. The patient may stop case management services at any time by phone call to the office staff.  Patient agreed to services and consent obtained.  Assessment:  Review of patient status, including review of consultants reports, laboratory and other test data, was performed as part of  comprehensive evaluation and provision of chronic care management services.   SDOH (Social Determinants of Health) assessments and interventions performed:    Objective:  Lab Results  Component Value Date   CREATININE 0.62 04/04/2021   CREATININE 0.91 12/27/2018   CREATININE 0.9 02/02/2017    Lab Results  Component Value Date   HGBA1C 11.3 (H) 04/04/2021       Component Value Date/Time   CHOL 295 (H) 04/04/2021 1059   TRIG 131 04/04/2021 1059   HDL 65 04/04/2021 1059   CHOLHDL 4.5 04/04/2021 1059   VLDL 23 05/16/2016 1005   LDLCALC 202 (H) 04/04/2021 1059     Clinical ASCVD:  The 10-year ASCVD risk score (Arnett DK, et al., 2019) is: 1.4%   Values used to calculate the score:     Age: 40 year37    Sex: Female     Is Non-Hispanic African American: Yes     Diabetic: Yes     Tobacco smoker: No     Systolic Blood Pressure: 130 mmH219   Is BP treated: No     HDL Cholesterol: 65 mg/dL     Total Cholesterol: 295 mg/dL    BP Readings from Last 3 Encounters:  04/15/21 130/84  04/04/21 (!) 160/90  04/07/20 127/77    Care Plan  Allergies  Allergen Reactions   Metformin Other (See Comments)    Vaginal yeast infections   Ampicillin Diarrhea   Doxycycline     Nausea/vomiting   Xigduo Xr [Dapagliflozin-Metformin Hcl Er]     Yeast infections.     Medications Reviewed Today     Reviewed by BreebacLavada Mesician Assistant)  on 04/15/21 at 1153  Med List Status: <None>   Medication Order Taking? Sig Documenting Provider Last Dose Status Informant  empagliflozin (JARDIANCE) 10 MG TABS tablet [id number] Yes Take 1 tablet (10 mg total) by mouth daily. [name], PA-C Taking Active   losartan (COZAAR) 25 MG tablet [id number] Yes Take 1 tablet (25 mg total) by mouth daily. [name], PA-C Taking Active   ondansetron (ZOFRAN ODT) 8 MG disintegrating tablet [id number] Yes Take 1 tablet (8 mg total) by mouth every 8 (eight) hours as needed for nausea or  vomiting. [name], PA-C Taking Active   Discontinued 04/15/21 1131 (Error)   Semaglutide,0.25 or 0.5MG/DOS, (OZEMPIC, 0.25 OR 0.5 MG/DOSE,) 2 MG/1.5ML SOPN [id number] Yes Inject 0.5 mg as directed once a week. [name], PA-C Taking Active   simvastatin (ZOCOR) 40 MG tablet [id number] Yes Take 1 tablet (40 mg total) by mouth every evening. [name], PA-C Taking Active   Vitamin D, Ergocalciferol, (DRISDOL) 1.25 MG (50000 UNIT) CAPS capsule [id number] Yes Take 1 capsule (50,000 Units total) by mouth every 7 (seven) days. [name], PA-C Taking Active             Patient Active Problem List   Diagnosis Date Noted   Conductive hearing loss 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 04/18/2021   Acute nonintractable headache 04/18/2021   Ear pressure, left 04/15/2021   Microalbuminuria 04/05/2021   Uncontrolled type 2 diabetes mellitus with hyperglycemia ([place]) 04/05/2021   Arthralgia 04/05/2021   Nausea 04/05/2021   Abscess 01/27/2020   COVID-19 virus infection 01/27/2020   Anemia 01/27/2020   Abscess of left elbow 07/03/2019   No energy 12/30/2018   Breast pain, right 12/30/2018   Dyslipidemia (high LDL; low HDL) 03/20/2018   Hidradenitis suppurativa 11/09/2017   Hyperpigmentation of skin, postinflammatory 11/09/2017   Acanthosis nigricans 11/09/2017   Chronic left-sided low back pain without sciatica 07/08/2017   Anxiety 07/08/2017   Chronic fatigue syndrome 02/18/2017   Morbidly obese ([place]) 10/20/2016   Vitamin D deficiency 10/03/2016   Absolute anemia 09/07/2015   Insomnia 06/20/2015   Type 2 diabetes mellitus, controlled ([place]) 06/08/2014   Crohn's disease ([place]) 03/30/2014   Menorrhagia 07/03/2013   MIGRAINE HEADACHE 06/28/2010   Dyslipidemia, goal LDL below 70 04/27/2010   Iron deficiency anemia 01/21/2010   WEIGHT GAIN 11/13/2008   HYPERTENSION, BENIGN ESSENTIAL 04/26/2006    Conditions to be addressed/monitored: HTN, HLD, and  DMII  There are no care plans that you recently modified to display for this patient.   Medication Assistance:  None required.  Patient affirms current coverage meets needs.  Follow Up:  Patient agrees to Care Plan and Follow-up.  Plan: Telephone follow up appointment with care management team member scheduled for:  2 weeks  Larinda Buttery, PharmD Clinical Pharmacist [facility] [phone]

## 2021-04-21 NOTE — Progress Notes (Signed)
Normal mammogram. Follow up in 1 year.

## 2021-04-22 ENCOUNTER — Ambulatory Visit: Payer: No Typology Code available for payment source | Admitting: Pharmacist

## 2021-04-22 ENCOUNTER — Telehealth: Payer: Self-pay | Admitting: Physician Assistant

## 2021-04-22 ENCOUNTER — Other Ambulatory Visit: Payer: Self-pay

## 2021-04-22 DIAGNOSIS — E785 Hyperlipidemia, unspecified: Secondary | ICD-10-CM

## 2021-04-22 DIAGNOSIS — I10 Essential (primary) hypertension: Secondary | ICD-10-CM

## 2021-04-22 DIAGNOSIS — E1165 Type 2 diabetes mellitus with hyperglycemia: Secondary | ICD-10-CM

## 2021-04-22 NOTE — Telephone Encounter (Signed)
[facility]    Patient called and stated she asked Mardene Celeste to message you yesterday about the Orlene Plum you placed on her arm. She said it was hurting her real bad and she removed it and would like to know what she needs to do. She would like for you to call her at [place]

## 2021-04-22 NOTE — Patient Instructions (Signed)
Visit Information  Patient Goals/Self-Care Activities Over the next 14 days, patient will:  take medications as prescribed and think about preferred method for glucose monitoring since she could not tolerate CGM sensor on arm  Follow Up Plan: Telephone follow up appointment with care management team member scheduled for:  2 weeks   Patient verbalizes understanding of instructions provided today and agrees to view in [place].   Telephone follow up appointment with care management team member scheduled for: 2 weeks  [name]

## 2021-04-22 NOTE — Progress Notes (Signed)
Care Management   Pharmacy Note  04/22/2021 Name: Stacie Cardenas MRN: [id number] DOB: 09/14/1980   Summary: addressed HTN, HLD, and primarily DM. Patient called to notify that the CGM sensor was hurting her - described as a constant pain, not from bumping it or motion. She removed the sensor and is seeking alternative options.  Recommendations/Changes made from today's visit: - Discussed w/patient that options for BG monitoring are either to try CGM in a different location on arm, versus revert to traditional glucometer finger sticks. She wants to think on this and get back to me.  Previous recommendations that remain unchanged: - Continue ozempic: complete 4 total doses of ozempic 0.77m weekly, then increase to 177mweekly (RX for Ozempic 70m19mo pharmacy) - Consider change to rosuvastatin 47m82mily w/goal of titrating to high intensity (20-40mg570m tolerated, stop simvastatin.  - Continue losartan 50mg 48my, will need new RX when taking two of the 25mg t65mts runs out.  Plan: f/u with pharmacist in 2 weeks to assess ozempic tolerance & decide on glucose monitoring plan.  Subjective: Stacie Cardenas MDATRA CLARY0 y.o.60ear old female who is a primary care patient of BreebacDonella Cardenas The Care Management team was consulted for assistance with care management and care coordination needs.     Engaged with patient face to face for initial visit in response to provider referral for pharmacy case management and/or care coordination services.   The patient was given information about Care Management services today including:  Care Management services includes personalized support from designated clinical staff supervised by the patient's primary care provider, including individualized plan of care and coordination with other care providers. 24/7 contact phone numbers for assistance for urgent and routine care needs. The patient may stop case management services at any time by phone call to the  office staff.  Patient agreed to services and consent obtained.  Assessment:  Review of patient status, including review of consultants reports, laboratory and other test data, was performed as part of comprehensive evaluation and provision of chronic care management services.   SDOH (Social Determinants of Health) assessments and interventions performed:    Objective:  Lab Results  Component Value Date   CREATININE 0.62 04/04/2021   CREATININE 0.91 12/27/2018   CREATININE 0.9 02/02/2017    Lab Results  Component Value Date   HGBA1C 11.3 (H) 04/04/2021       Component Value Date/Time   CHOL 295 (H) 04/04/2021 1059   TRIG 131 04/04/2021 1059   HDL 65 04/04/2021 1059   CHOLHDL 4.5 04/04/2021 1059   VLDL 23 05/16/2016 1005   LDLCALC 202 (H) 04/04/2021 1059     Clinical ASCVD:  The 10-year ASCVD risk score (Arnett DK, et al., 2019) is: 1.4%   Values used to calculate the score:     Age: 40 year40    Sex: Female     Is Non-Hispanic African American: Yes     Diabetic: Yes     Tobacco smoker: No     Systolic Blood Pressure: 130 mmH818   Is BP treated: No     HDL Cholesterol: 65 mg/dL     Total Cholesterol: 295 mg/dL    BP Readings from Last 3 Encounters:  04/15/21 130/84  04/04/21 (!) 160/90  04/07/20 127/77    Care Plan  Allergies  Allergen Reactions   Metformin Other (See Comments)    Vaginal yeast infections   Ampicillin Diarrhea  Doxycycline     Nausea/vomiting   Xigduo Xr [Dapagliflozin-Metformin Hcl Er]     Yeast infections.     Medications Reviewed Today     Reviewed by Stacie Cardenas, [facility] (Pharmacist) on 04/20/21 at 1327  Med List Status: <None>   Medication Order Taking? Sig Documenting Provider Last Dose Status Informant  empagliflozin (JARDIANCE) 10 MG TABS tablet [id number] Yes Take 1 tablet (10 mg total) by mouth daily. Stacie Stade, PA-C Taking Active   fluticasone (FLONASE) 50 MCG/ACT nasal spray [id number] Yes Place 2 sprays into  both nostrils daily. Stacie Stade, PA-C Taking Active     Discontinued 04/15/21 1153   ondansetron (ZOFRAN ODT) 8 MG disintegrating tablet [id number] Yes Take 1 tablet (8 mg total) by mouth every 8 (eight) hours as needed for nausea or vomiting. Stacie Cardenas, Stacie L, PA-C Taking Active   Semaglutide,0.25 or 0.5MG/DOS, (OZEMPIC, 0.25 OR 0.5 MG/DOSE,) 2 MG/1.5ML SOPN [id number] Yes Inject 0.5 mg as directed once a week. Stacie Stade, PA-C Taking Active            Med Note Stacie Cardenas Apr 20, 2021  1:25 PM) Takes Mondays 04/11/21 first dose restart  simvastatin (ZOCOR) 40 MG tablet [id number] Yes Take 1 tablet (40 mg total) by mouth every evening. Stacie Stade, PA-C Taking Active   Vitamin D, Ergocalciferol, (DRISDOL) 1.25 MG (50000 UNIT) CAPS capsule [id number] Yes Take 1 capsule (50,000 Units total) by mouth every 7 (seven) days. Stacie Stade, PA-C Taking Active            Med Note Stacie Cardenas Apr 20, 2021  1:26 PM) Taking on Saturdays             Patient Active Problem List   Diagnosis Date Noted   Conductive hearing loss 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 04/18/2021   Acute nonintractable headache 04/18/2021   Ear pressure, left 04/15/2021   Microalbuminuria 04/05/2021   Uncontrolled type 2 diabetes mellitus with hyperglycemia ([place]) 04/05/2021   Arthralgia 04/05/2021   Nausea 04/05/2021   Abscess 01/27/2020   COVID-19 virus infection 01/27/2020   Anemia 01/27/2020   Abscess of left elbow 07/03/2019   No energy 12/30/2018   Breast pain, right 12/30/2018   Dyslipidemia (high LDL; low HDL) 03/20/2018   Hidradenitis suppurativa 11/09/2017   Hyperpigmentation of skin, postinflammatory 11/09/2017   Acanthosis nigricans 11/09/2017   Chronic left-sided low back pain without sciatica 07/08/2017   Anxiety 07/08/2017   Chronic fatigue syndrome 02/18/2017   Morbidly obese ([place]) 10/20/2016   Vitamin D deficiency 10/03/2016   Absolute anemia  09/07/2015   Insomnia 06/20/2015   Type 2 diabetes mellitus, controlled ([place]) 06/08/2014   Crohn's disease ([place]) 03/30/2014   Menorrhagia 07/03/2013   MIGRAINE HEADACHE 06/28/2010   Dyslipidemia, goal LDL below 70 04/27/2010   Iron deficiency anemia 01/21/2010   WEIGHT GAIN 11/13/2008   HYPERTENSION, BENIGN ESSENTIAL 04/26/2006    Conditions to be addressed/monitored: HTN, HLD, and DMII  There are no care plans that you recently modified to display for this patient.   Medication Assistance:  None required.  Patient affirms current coverage meets needs.  Follow Up:  Patient agrees to Care Plan and Follow-up.  Plan: Telephone follow up appointment with care management team member scheduled for:  2 weeks  Larinda Buttery, PharmD Clinical Pharmacist [facility] Primary Care At [facility] [phone]

## 2021-04-27 MED ORDER — ROSUVASTATIN CALCIUM 10 MG PO TABS: 10.0000 mg | ORAL_TABLET | Freq: Every day | ORAL | 3 refills | Status: DC

## 2021-04-27 MED ORDER — SEMAGLUTIDE (1 MG/DOSE) 4 MG/3ML ~~LOC~~ SOPN: 1.0000 mg | PEN_INJECTOR | SUBCUTANEOUS | 0 refills | Status: DC

## 2021-04-27 NOTE — Addendum Note (Signed)
Addended by: [name] on: 04/27/2021 03:32 PM   Modules accepted: Orders

## 2021-05-02 ENCOUNTER — Telehealth: Payer: No Typology Code available for payment source

## 2021-05-02 ENCOUNTER — Telehealth: Payer: Self-pay | Admitting: Pharmacist

## 2021-05-02 NOTE — Progress Notes (Deleted)
Care Management   Pharmacy Note  05/02/2021 Name: Stacie Cardenas MRN: [id number] DOB: 15-Jun-1980   Summary: addressed HTN, HLD, and primarily DM. Patient called to notify that the CGM sensor was hurting her - described as a constant pain, not from bumping it or motion. She removed the sensor and is seeking alternative options.  Recommendations/Changes made from today's visit: - Discussed w/patient that options for BG monitoring are either to try CGM in a different location on arm, versus revert to traditional glucometer finger sticks. She wants to think on this and get back to me.  Previous recommendations that remain unchanged: - Continue ozempic: complete 4 total doses of ozempic 0.80m weekly, then increase to 19mweekly (RX for Ozempic 48m9mo pharmacy) - Consider change to rosuvastatin 41m13mily w/goal of titrating to high intensity (20-40mg46m tolerated, stop simvastatin.  - Continue losartan 50mg 28my, will need new RX when taking two of the 25mg t70mts runs out.  Plan: f/u with pharmacist in 2 weeks to assess ozempic tolerance & decide on glucose monitoring plan.  Subjective: Stacie MMORA PEDRAZA0 y.o.47ear old female who is a primary care patient of BreebacDonella Stade The Care Management team was consulted for assistance with care management and care coordination needs.     Engaged with patient face to face for initial visit in response to provider referral for pharmacy case management and/or care coordination services.   The patient was given information about Care Management services today including:  Care Management services includes personalized support from designated clinical staff supervised by the patient's primary care provider, including individualized plan of care and coordination with other care providers. 24/7 contact phone numbers for assistance for urgent and routine care needs. The patient may stop case management services at any time by phone call to the  office staff.  Patient agreed to services and consent obtained.  Assessment:  Review of patient status, including review of consultants reports, laboratory and other test data, was performed as part of comprehensive evaluation and provision of chronic care management services.   SDOH (Social Determinants of Health) assessments and interventions performed:    Objective:  Lab Results  Component Value Date   CREATININE 0.62 04/04/2021   CREATININE 0.91 12/27/2018   CREATININE 0.9 02/02/2017    Lab Results  Component Value Date   HGBA1C 11.3 (H) 04/04/2021       Component Value Date/Time   CHOL 295 (H) 04/04/2021 1059   TRIG 131 04/04/2021 1059   HDL 65 04/04/2021 1059   CHOLHDL 4.5 04/04/2021 1059   VLDL 23 05/16/2016 1005   LDLCALC 202 (H) 04/04/2021 1059     Clinical ASCVD:  The 10-year ASCVD risk score (Arnett DK, et al., 2019) is: 1.4%   Values used to calculate the score:     Age: 15 year21    Sex: Female     Is Non-Hispanic African American: Yes     Diabetic: Yes     Tobacco smoker: No     Systolic Blood Pressure: 130 mmH536   Is BP treated: No     HDL Cholesterol: 65 mg/dL     Total Cholesterol: 295 mg/dL    BP Readings from Last 3 Encounters:  04/15/21 130/84  04/04/21 (!) 160/90  04/07/20 127/77    Care Plan  Allergies  Allergen Reactions   Metformin Other (See Comments)    Vaginal yeast infections   Ampicillin Diarrhea  Doxycycline     Nausea/vomiting   Xigduo Xr [Dapagliflozin-Metformin Hcl Er]     Yeast infections.     Medications Reviewed Today     Reviewed by [name], [facility] (Pharmacist) on 04/20/21 at 1327  Med List Status: <None>   Medication Order Taking? Sig Documenting Provider Last Dose Status Informant  empagliflozin (JARDIANCE) 10 MG TABS tablet [id number] Yes Take 1 tablet (10 mg total) by mouth daily. [name], PA-C Taking Active   fluticasone (FLONASE) 50 MCG/ACT nasal spray [id number] Yes Place 2 sprays into  both nostrils daily. [name], PA-C Taking Active     Discontinued 04/15/21 1153   ondansetron (ZOFRAN ODT) 8 MG disintegrating tablet [id number] Yes Take 1 tablet (8 mg total) by mouth every 8 (eight) hours as needed for nausea or vomiting. Breeback, Jade L, PA-C Taking Active   Semaglutide,0.25 or 0.5MG/DOS, (OZEMPIC, 0.25 OR 0.5 MG/DOSE,) 2 MG/1.5ML SOPN [id number] Yes Inject 0.5 mg as directed once a week. [name], PA-C Taking Active            Med Note [name] Apr 20, 2021  1:25 PM) Takes Mondays 04/11/21 first dose restart  simvastatin (ZOCOR) 40 MG tablet [id number] Yes Take 1 tablet (40 mg total) by mouth every evening. [name], PA-C Taking Active   Vitamin D, Ergocalciferol, (DRISDOL) 1.25 MG (50000 UNIT) CAPS capsule [id number] Yes Take 1 capsule (50,000 Units total) by mouth every 7 (seven) days. [name], PA-C Taking Active            Med Note [name] Apr 20, 2021  1:26 PM) Taking on Saturdays             Patient Active Problem List   Diagnosis Date Noted   Conductive hearing loss 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 04/18/2021   Acute nonintractable headache 04/18/2021   Ear pressure, left 04/15/2021   Microalbuminuria 04/05/2021   Uncontrolled type 2 diabetes mellitus with hyperglycemia ([place]) 04/05/2021   Arthralgia 04/05/2021   Nausea 04/05/2021   Abscess 01/27/2020   COVID-19 virus infection 01/27/2020   Anemia 01/27/2020   Abscess of left elbow 07/03/2019   No energy 12/30/2018   Breast pain, right 12/30/2018   Dyslipidemia (high LDL; low HDL) 03/20/2018   Hidradenitis suppurativa 11/09/2017   Hyperpigmentation of skin, postinflammatory 11/09/2017   Acanthosis nigricans 11/09/2017   Chronic left-sided low back pain without sciatica 07/08/2017   Anxiety 07/08/2017   Chronic fatigue syndrome 02/18/2017   Morbidly obese ([place]) 10/20/2016   Vitamin D deficiency 10/03/2016   Absolute anemia  09/07/2015   Insomnia 06/20/2015   Type 2 diabetes mellitus, controlled ([place]) 06/08/2014   Crohn's disease ([place]) 03/30/2014   Menorrhagia 07/03/2013   MIGRAINE HEADACHE 06/28/2010   Dyslipidemia, goal LDL below 70 04/27/2010   Iron deficiency anemia 01/21/2010   WEIGHT GAIN 11/13/2008   HYPERTENSION, BENIGN ESSENTIAL 04/26/2006    Conditions to be addressed/monitored: HTN, HLD, and DMII  There are no care plans that you recently modified to display for this patient.   Medication Assistance:  None required.  Patient affirms current coverage meets needs.  Follow Up:  Patient agrees to Care Plan and Follow-up.  Plan: Telephone follow up appointment with care management team member scheduled for:  2 weeks  Larinda Buttery, PharmD Clinical Pharmacist [facility] Primary Care At [facility] [phone]

## 2021-05-02 NOTE — Telephone Encounter (Signed)
Unsuccessful attempt x2 to contact patient for follow-up phone call for CCM services with pharmacist.  Will route to schedule team for reschedule.  Larinda Buttery, PharmD Clinical Pharmacist [facility] Primary Care At [facility] [phone]

## 2021-05-03 NOTE — Chronic Care Management (AMB) (Signed)
  Care Management   Note  05/03/2021 Name: Stacie Cardenas MRN: [id number] DOB: 1980/11/01  Stacie Cardenas is a 40 y.o. year old female who is a primary care patient of Lavada Mesi and is actively engaged with the care management team. I reached out to Stacie Cardenas by phone today to assist with re-scheduling a follow up visit with the Pharmacist  Follow up plan: Telephone appointment with care management team member scheduled for: 05/10/2021  Julian Hy, [place], [place] Management  Direct Dial: [phone]

## 2021-05-08 ENCOUNTER — Other Ambulatory Visit: Payer: Self-pay | Admitting: Physician Assistant

## 2021-05-08 DIAGNOSIS — R519 Headache, unspecified: Secondary | ICD-10-CM

## 2021-05-08 DIAGNOSIS — H938X2 Other specified disorders of left ear: Secondary | ICD-10-CM

## 2021-05-08 DIAGNOSIS — H9012 Conductive hearing loss, unilateral, left ear, with unrestricted hearing on the contralateral side: Secondary | ICD-10-CM

## 2021-05-10 ENCOUNTER — Other Ambulatory Visit: Payer: Self-pay

## 2021-05-10 ENCOUNTER — Ambulatory Visit: Payer: No Typology Code available for payment source | Admitting: Pharmacist

## 2021-05-10 DIAGNOSIS — E1165 Type 2 diabetes mellitus with hyperglycemia: Secondary | ICD-10-CM

## 2021-05-10 DIAGNOSIS — I10 Essential (primary) hypertension: Secondary | ICD-10-CM

## 2021-05-10 DIAGNOSIS — E785 Hyperlipidemia, unspecified: Secondary | ICD-10-CM

## 2021-05-10 NOTE — Progress Notes (Signed)
Send losartan 73m to  Send glucometer to   Care Management   Pharmacy Note  05/11/2021 Name: Stacie DALPORTOMRN: [id number]: 7Aug 30, 1982  Summary: addressed HTN, HLD, and primarily DM. Patient states CGM sensor was hurting her - patient decided she will use traditional glucometer. She increased dosing to ozempic 154mweekly, states some irregular patterns with bowels but is currently tolerable.  Recommendations/Changes made from today's visit: - PCP to please send losartan 5074mo [place] currently using two tablets of 56m7md will run out in 16 days, she requests 50mg39mlet RX) - PCP to please send glucometer & supplies to [place] [facility] with pharmacist in 1 month to assess ozempic tolerance & discuss BG numbers  Subjective: Stacie Cardenas 40 y.40 year old female who is a primary care patient of Stacie Cardenas. The Care Management team was consulted for assistance with care management and care coordination needs.     Engaged with patient by telephone for initial visit in response to provider referral for pharmacy case management and/or care coordination services.   The patient was given information about Care Management services today including:  Care Management services includes personalized support from designated clinical staff supervised by the patient's primary care provider, including individualized plan of care and coordination with other care providers. 24/7 contact phone numbers for assistance for urgent and routine care needs. The patient may stop case management services at any time by phone call to the office staff.  Patient agreed to services and consent obtained.  Assessment:  Review of patient status, including review of consultants reports, laboratory and other test data, was performed as part of comprehensive evaluation and provision of chronic care management services.   SDOH (Social Determinants of Health)  assessments and interventions performed:    Objective:  Lab Results  Component Value Date   CREATININE 0.62 04/04/2021   CREATININE 0.91 12/27/2018   CREATININE 0.9 02/02/2017    Lab Results  Component Value Date   HGBA1C 11.3 (H) 04/04/2021       Component Value Date/Time   CHOL 295 (H) 04/04/2021 1059   TRIG 131 04/04/2021 1059   HDL 65 04/04/2021 1059   CHOLHDL 4.5 04/04/2021 1059   VLDL 23 05/16/2016 1005   LDLCALC 202 (H) 04/04/2021 1059     Clinical ASCVD:  The 10-year ASCVD risk score (Arnett DK, et al., 2019) is: 1.4%   Values used to calculate the score:     Age: 73 ye71s     Sex: Female     Is Non-Hispanic African American: Yes     Diabetic: Yes     Tobacco smoker: No     Systolic Blood Pressure: 130 m814     Is BP treated: No     HDL Cholesterol: 65 mg/dL     Total Cholesterol: 295 mg/dL    BP Readings from Last 3 Encounters:  04/15/21 130/84  04/04/21 (!) 160/90  04/07/20 127/77    Care Plan  Allergies  Allergen Reactions   Metformin Other (See Comments)    Vaginal yeast infections   Ampicillin Diarrhea   Doxycycline     Nausea/vomiting   Xigduo Xr [Dapagliflozin-Metformin Hcl Er]     Yeast infections.     Medications Reviewed Today     Reviewed by Stacie Cardenas ([facility]) on 04/20/21 at 1327  Med List Status: <None>   Medication Order Taking? Sig Documenting  Provider Last Dose Status Informant  empagliflozin (JARDIANCE) 10 MG TABS tablet [id number] Yes Take 1 tablet (10 mg total) by mouth daily. Stacie Stade, PA-C Taking Active   fluticasone (FLONASE) 50 MCG/ACT nasal spray [id number] Yes Place 2 sprays into both nostrils daily. Stacie Stade, PA-C Taking Active     Discontinued 04/15/21 1153   ondansetron (ZOFRAN ODT) 8 MG disintegrating tablet [id number] Yes Take 1 tablet (8 mg total) by mouth every 8 (eight) hours as needed for nausea or vomiting. Breeback, Jade L, PA-C Taking Active   Semaglutide,0.25 or 0.5MG/DOS,  (OZEMPIC, 0.25 OR 0.5 MG/DOSE,) 2 MG/1.5ML SOPN [id number] Yes Inject 0.5 mg as directed once a week. Stacie Stade, PA-C Taking Active            Med Note [name] Apr 20, 2021  1:25 PM) Takes Mondays 04/11/21 first dose restart  simvastatin (ZOCOR) 40 MG tablet [id number] Yes Take 1 tablet (40 mg total) by mouth every evening. Stacie Stade, PA-C Taking Active   Vitamin D, Ergocalciferol, (DRISDOL) 1.25 MG (50000 UNIT) CAPS capsule [id number] Yes Take 1 capsule (50,000 Units total) by mouth every 7 (seven) days. Stacie Stade, PA-C Taking Active            Med Note [name] Apr 20, 2021  1:26 PM) Taking on Saturdays             Patient Active Problem List   Diagnosis Date Noted   Conductive hearing loss 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 04/18/2021   Acute nonintractable headache 04/18/2021   Ear pressure, left 04/15/2021   Microalbuminuria 04/05/2021   Uncontrolled type 2 diabetes mellitus with hyperglycemia ([place]) 04/05/2021   Arthralgia 04/05/2021   Nausea 04/05/2021   Abscess 01/27/2020   COVID-19 virus infection 01/27/2020   Anemia 01/27/2020   Abscess of left elbow 07/03/2019   No energy 12/30/2018   Breast pain, right 12/30/2018   Dyslipidemia (high LDL; low HDL) 03/20/2018   Hidradenitis suppurativa 11/09/2017   Hyperpigmentation of skin, postinflammatory 11/09/2017   Acanthosis nigricans 11/09/2017   Chronic left-sided low back pain without sciatica 07/08/2017   Anxiety 07/08/2017   Chronic fatigue syndrome 02/18/2017   Morbidly obese (Stacie Cardenas) 10/20/2016   Vitamin D deficiency 10/03/2016   Absolute anemia 09/07/2015   Insomnia 06/20/2015   Type 2 diabetes mellitus, controlled ([place]) 06/08/2014   Crohn's disease ([place]) 03/30/2014   Menorrhagia 07/03/2013   MIGRAINE HEADACHE 06/28/2010   Dyslipidemia, goal LDL below 70 04/27/2010   Iron deficiency anemia 01/21/2010   WEIGHT GAIN 11/13/2008   HYPERTENSION, BENIGN  ESSENTIAL 04/26/2006    Conditions to be addressed/monitored: HTN, HLD, and DMII  Care Plan : Medication Management  Updates made by [name], [place] since 05/11/2021 12:00 AM     Problem: DM, HTN, HLD      Long-Range Goal: Disease Progression Prevention   Start Date: 04/20/2021  Recent Progress: On track  Priority: High  Note:   Current Barriers:  Unable to achieve control of diabetes, HLD  Does not adhere to prescribed medication regimen  Pharmacist Clinical Goal(s):  Over the next 14 days, patient will adhere to plan to optimize therapeutic regimen for diabetes and hyperlipidemia as evidenced by report of adherence to recommended medication management changes through collaboration with PharmD and provider.   Interventions: 1:1 collaboration with Stacie Stade, PA-C regarding development and update of comprehensive plan of care as  evidenced by provider attestation and co-signature Inter-disciplinary care team collaboration (see longitudinal plan of care) Comprehensive medication review performed; medication list updated in electronic medical record  Diabetes:   Controlled; current treatment:jardiance 28m daily, ozempic 167mweekly; a1c 11.3, patient states she had covid August & December 2021 and fell off track/stopped taking medications  Current glucose readings:not currently checking  Reports hyperglycemic symptoms  Current meal patterns: breakfast: bacon, eggs, toast, oatmeal + fruit; lunch: grilled BBQ chicken & corn; dinner: air fryer or grill food, salmon, peas, broccoli; snacks: jello w/fruit, nuts (careful w/ crohns), chips, cheezits, "bad w/ sweets"; drinks: dr pepper ~1 per day but not daily, water,   Current exercise: walks  Counseled on mechanism of action of medications, importance of proper glycemic control and downstream effects Recommended continue ozempic 69m57meekly, with goal of titrating to 2mg48mekly if tolerated.  Hypertension:  Controlled; current  treatment:losartan 50mg569mly (recently increased to 50mg 57mast PCP visit);   Current home readings: SBP 120-130s  Denies hypotensive/hypertensive symptoms  Recommended continue current regimen Hyperlipidemia:  Uncontrolled; current treatment: rosuvastatin 10mg d45m (changed from simvastatin with goal of titrating to high intensity as tolerated)   Recommended continue rosuvastatin 10mg da59m titrate up dose as tolerated   Patient Goals/Self-Care Activities Over the next 30 days, patient will:  take medications as prescribed and check glucose using glucometer once daily in AM before food, and 2-3x per week 1-2hours after a meal, document, and provide at future appointments  Follow Up Plan: Telephone follow up appointment with care management team member scheduled for:  1 month      Medication Assistance:  None required.  Patient affirms current coverage meets needs.  Follow Up:  Patient agrees to Care Plan and Follow-up.  Plan: Telephone follow up appointment with care management team member scheduled for:  1 month  Bejamin Hackbart KLarinda Buttery Clinical Pharmacist [phone]

## 2021-05-11 NOTE — Patient Instructions (Signed)
Visit Information  Thank you for taking time to visit with me today. Please don't hesitate to contact me if I can be of assistance to you before our next scheduled telephone appointment.  Following are the goals we discussed today:   Patient Goals/Self-Care Activities Over the next 30 days, patient will:  take medications as prescribed and check glucose using glucometer once daily in AM before food, and 2-3x per week 1-2hours after a meal, document, and provide at future appointments  Follow Up Plan: Telephone follow up appointment with care management team member scheduled for:  1 month  Please call the care guide team at [phone] if you need to cancel or reschedule your appointment.     Patient verbalizes understanding of instructions provided today and agrees to view in [place].   Stacie Cardenas

## 2021-05-13 NOTE — Progress Notes (Signed)
Care Management   Pharmacy Note  05/13/2021 Name: Stacie Cardenas MRN: [id number] DOB: [birth year]   Summary: addressed HTN, HLD, and primarily DM. Patient states CGM sensor was hurting her - patient decided she will use traditional glucometer. She increased dosing to ozempic 71m weekly, states some irregular patterns with bowels but is currently tolerable.  Recommendations/Changes made from today's visit: - PCP to please send losartan [facility] currently using two tablets of 2515mnd will run out in 16 days, she requests 25m61mblet RX) - PCP to [facility]ist in 1 month to assess ozempic tolerance & discuss BG numbers  Subjective: Stacie OVIEDOa 40 y89. year old female who is a primary care patient of BreeDonella Cardenas-C. The Care Management team was consulted for assistance with care management and care coordination needs.     Engaged with patient by telephone for initial visit in response to provider referral for pharmacy case management and/or care coordination services.   The patient was given information about Care Management services today including:  Care Management services includes personalized support from designated clinical staff supervised by the patient's primary care provider, including individualized plan of care and coordination with other care providers. 24/7 contact phone numbers for assistance for urgent and routine care needs. The patient may stop case management services at any time by phone call to the office staff.  Patient agreed to services and consent obtained.  Assessment:  Review of patient status, including review of consultants reports, laboratory and other test data, was performed as part of comprehensive evaluation and provision of chronic care management services.   SDOH (Social Determinants of Health) assessments and interventions performed:     Objective:  Lab Results  Component Value Date   CREATININE 0.62 04/04/2021   CREATININE 0.91 12/27/2018   CREATININE 0.9 02/02/2017    Lab Results  Component Value Date   HGBA1C 11.3 (H) 04/04/2021       Component Value Date/Time   CHOL 295 (H) 04/04/2021 1059   TRIG 131 04/04/2021 1059   HDL 65 04/04/2021 1059   CHOLHDL 4.5 04/04/2021 1059   VLDL 23 05/16/2016 1005   LDLCALC 202 (H) 04/04/2021 1059     Clinical ASCVD:  The 10-year ASCVD risk score (Arnett DK, et al., 2019) is: 1.9%   Values used to calculate the score:     Age: 40 y37rs     Sex: Female     Is Non-Hispanic African American: Yes     Diabetic: Yes     Tobacco smoker: No     Systolic Blood Pressure: 115 426g     Is BP treated: Yes     HDL Cholesterol: 65 mg/dL     Total Cholesterol: 295 mg/dL    BP Readings from Last 3 Encounters:  04/15/21 130/84  04/04/21 (!) 160/90  04/07/20 127/77    Care Plan  Allergies  Allergen Reactions   Metformin Other (See Comments)    Vaginal yeast infections   Ampicillin Diarrhea   Doxycycline     Nausea/vomiting   Xigduo Xr [Dapagliflozin-Metformin Hcl Er]     Yeast infections.     Medications Reviewed Today     Reviewed by KlinDarius Bump[facility]) on 04/20/21 at 1327  Med List Status: <None>   Medication Order Taking? Sig Documenting Provider Last Dose Status Informant  empagliflozin (JARDIANCE) 10  MG TABS tablet [id number] Yes Take 1 tablet (10 mg total) by mouth daily. Stacie Stade, PA-C Taking Active   fluticasone (FLONASE) 50 MCG/ACT nasal spray [id number] Yes Place 2 sprays into both nostrils daily. Stacie Stade, PA-C Taking Active     Discontinued 04/15/21 1153   ondansetron (ZOFRAN ODT) 8 MG disintegrating tablet [id number] Yes Take 1 tablet (8 mg total) by mouth every 8 (eight) hours as needed for nausea or vomiting. Breeback, Jade L, PA-C Taking Active   Semaglutide,0.25 or 0.5MG/DOS, (OZEMPIC, 0.25 OR 0.5 MG/DOSE,) 2 MG/1.5ML  SOPN [id number] Yes Inject 0.5 mg as directed once a week. Stacie Stade, PA-C Taking Active            Med Note [name] Apr 20, 2021  1:25 PM) Takes Mondays 04/11/21 first dose restart  simvastatin (ZOCOR) 40 MG tablet [id number] Yes Take 1 tablet (40 mg total) by mouth every evening. Stacie Stade, PA-C Taking Active   Vitamin D, Ergocalciferol, (DRISDOL) 1.25 MG (50000 UNIT) CAPS capsule [id number] Yes Take 1 capsule (50,000 Units total) by mouth every 7 (seven) days. Stacie Stade, PA-C Taking Active            Med Note [name] Apr 20, 2021  1:26 PM) Taking on Saturdays             Patient Active Problem List   Diagnosis Date Noted   Conductive hearing loss 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 04/18/2021   Acute nonintractable headache 04/18/2021   Ear pressure, left 04/15/2021   Microalbuminuria 04/05/2021   Uncontrolled type 2 diabetes mellitus with hyperglycemia ([place]) 04/05/2021   Arthralgia 04/05/2021   Nausea 04/05/2021   Abscess 01/27/2020   COVID-19 virus infection 01/27/2020   Anemia 01/27/2020   Abscess of left elbow 07/03/2019   No energy 12/30/2018   Breast pain, right 12/30/2018   Dyslipidemia (high LDL; low HDL) 03/20/2018   Hidradenitis suppurativa 11/09/2017   Hyperpigmentation of skin, postinflammatory 11/09/2017   Acanthosis nigricans 11/09/2017   Chronic left-sided low back pain without sciatica 07/08/2017   Anxiety 07/08/2017   Chronic fatigue syndrome 02/18/2017   Morbidly obese ([place]) 10/20/2016   Vitamin D deficiency 10/03/2016   Absolute anemia 09/07/2015   Insomnia 06/20/2015   Type 2 diabetes mellitus, controlled ([place]) 06/08/2014   Crohn's disease ([place]) 03/30/2014   Menorrhagia 07/03/2013   MIGRAINE HEADACHE 06/28/2010   Dyslipidemia, goal LDL below 70 04/27/2010   Iron deficiency anemia 01/21/2010   WEIGHT GAIN 11/13/2008   HYPERTENSION, BENIGN ESSENTIAL 04/26/2006    Conditions to be  addressed/monitored: HTN, HLD, and DMII  There are no care plans that you recently modified to display for this patient.    Medication Assistance:  None required.  Patient affirms current coverage meets needs.  Follow Up:  Patient agrees to Care Plan and Follow-up.  Plan: Telephone follow up appointment with care management team member scheduled for:  1 month  Larinda Buttery, PharmD Clinical Pharmacist [facility] [phone]

## 2021-05-17 MED ORDER — LOSARTAN POTASSIUM 50 MG PO TABS: 50.0000 mg | ORAL_TABLET | Freq: Every day | ORAL | 1 refills | Status: DC

## 2021-05-17 MED ORDER — LOSARTAN POTASSIUM 25 MG PO TABS: 25.0000 mg | ORAL_TABLET | Freq: Every day | ORAL | 1 refills | Status: DC

## 2021-05-17 MED ORDER — BLOOD GLUCOSE MONITOR KIT: PACK | 1 refills | Status: AC

## 2021-05-17 NOTE — Addendum Note (Signed)
Addended by: [name] on: 05/17/2021 04:22 PM   Modules accepted: Orders

## 2021-05-17 NOTE — Addendum Note (Signed)
Addended by: [name] on: 05/17/2021 04:24 PM   Modules accepted: Orders

## 2021-06-07 LAB — HM DIABETES EYE EXAM

## 2021-06-09 ENCOUNTER — Ambulatory Visit: Payer: No Typology Code available for payment source | Admitting: Pharmacist

## 2021-06-09 ENCOUNTER — Other Ambulatory Visit: Payer: Self-pay

## 2021-06-09 DIAGNOSIS — E1165 Type 2 diabetes mellitus with hyperglycemia: Secondary | ICD-10-CM

## 2021-06-09 DIAGNOSIS — I10 Essential (primary) hypertension: Secondary | ICD-10-CM

## 2021-06-09 DIAGNOSIS — E785 Hyperlipidemia, unspecified: Secondary | ICD-10-CM

## 2021-06-09 NOTE — Progress Notes (Signed)
Care Management   Pharmacy Note  06/09/2021 Name: Stacie Cardenas MRN: [id number] DOB: 03-Aug-1980   Summary: addressed HTN, HLD, and primarily DM.  She remains on ozempic 93m weekly, reports some abnormal bowel patterns and some indigestion. She states her indigestion contributes to worsening ear symptoms that she gets from her prior known history of a eustachian tube disorder.  BG values as follows:  Date Fasting After Breakfast  06/01/2021 97 119  06/02/2021 107 155  06/03/2021 102 166  06/04/2021 144   06/05/2021  144  06/06/2021 116   06/07/2021 130 110  06/08/2021 132 114  06/09/2021 133 106   She attributes higher fasting AM values to possibly from snacking late at night. Typical breakfast: bacon, eggs, baked apples, dr pepper.   Recommendations/Changes made from today's visit: - No changes, continue ozempic at 167mweekly for now. - Recommend patient spot check some evening values after eating dinner or her late night snacks - Repeat A1c at PCP visit in January, will determine at that time whether to increase to Ozempic 21m73meekly - Repeat lipid panel at PCP visit in January, increase crestor to 58m53m LDL remains >70   Plan: f/u with pharmacist in 1 month to assess ozempic tolerance & discuss BG numbers  Subjective: Stacie Cardenas 40 y72. year old female who is a primary care patient of BreeDonella Stade-C. The Care Management team was consulted for assistance with care management and care coordination needs.     Engaged with patient by telephone for follow up visit in response to provider referral for pharmacy case management and/or care coordination services.   The patient was given information about Care Management services today including:  Care Management services includes personalized support from designated clinical staff supervised by the patient's primary care provider, including individualized plan of care and coordination with other care  providers. 24/7 contact phone numbers for assistance for urgent and routine care needs. The patient may stop case management services at any time by phone call to the office staff.  Patient agreed to services and consent obtained.  Assessment:  Review of patient status, including review of consultants reports, laboratory and other test data, was performed as part of comprehensive evaluation and provision of chronic care management services.   SDOH (Social Determinants of Health) assessments and interventions performed:    Objective:  Lab Results  Component Value Date   CREATININE 0.62 04/04/2021   CREATININE 0.91 12/27/2018   CREATININE 0.9 02/02/2017    Lab Results  Component Value Date   HGBA1C 11.3 (H) 04/04/2021       Component Value Date/Time   CHOL 295 (H) 04/04/2021 1059   TRIG 131 04/04/2021 1059   HDL 65 04/04/2021 1059   CHOLHDL 4.5 04/04/2021 1059   VLDL 23 05/16/2016 1005   LDLCALC 202 (H) 04/04/2021 1059     Clinical ASCVD:  The 10-year ASCVD risk score (Arnett DK, et al., 2019) is: 1.9%   Values used to calculate the score:     Age: 71 y63rs     Sex: Female     Is Non-Hispanic African American: Yes     Diabetic: Yes     Tobacco smoker: No     Systolic Blood Pressure: 115 532g     Is BP treated: Yes     HDL Cholesterol: 65 mg/dL     Total Cholesterol: 295 mg/dL    BP Readings from Last 3 Encounters:  04/15/21 130/84  04/04/21 (!) 160/90  04/07/20 127/77    Care Plan  Allergies  Allergen Reactions   Metformin Other (See Comments)    Vaginal yeast infections   Ampicillin Diarrhea   Doxycycline     Nausea/vomiting   Xigduo Xr [Dapagliflozin-Metformin Hcl Er]     Yeast infections.     Medications Reviewed Today     Reviewed by [name], [facility] (Pharmacist) on 04/20/21 at 1327  Med List Status: <None>   Medication Order Taking? Sig Documenting Provider Last Dose Status Informant  empagliflozin (JARDIANCE) 10 MG TABS tablet [id number]  Yes Take 1 tablet (10 mg total) by mouth daily. [name], PA-C Taking Active   fluticasone (FLONASE) 50 MCG/ACT nasal spray [id number] Yes Place 2 sprays into both nostrils daily. [name], PA-C Taking Active     Discontinued 04/15/21 1153   ondansetron (ZOFRAN ODT) 8 MG disintegrating tablet [id number] Yes Take 1 tablet (8 mg total) by mouth every 8 (eight) hours as needed for nausea or vomiting. Breeback, Jade L, PA-C Taking Active   Semaglutide,0.25 or 0.5MG/DOS, (OZEMPIC, 0.25 OR 0.5 MG/DOSE,) 2 MG/1.5ML SOPN [id number] Yes Inject 0.5 mg as directed once a week. [name], PA-C Taking Active            Med Note [name] Apr 20, 2021  1:25 PM) Takes Mondays 04/11/21 first dose restart  simvastatin (ZOCOR) 40 MG tablet [id number] Yes Take 1 tablet (40 mg total) by mouth every evening. [name], PA-C Taking Active   Vitamin D, Ergocalciferol, (DRISDOL) 1.25 MG (50000 UNIT) CAPS capsule [id number] Yes Take 1 capsule (50,000 Units total) by mouth every 7 (seven) days. [name], PA-C Taking Active            Med Note [name] Apr 20, 2021  1:26 PM) Taking on Saturdays             Patient Active Problem List   Diagnosis Date Noted   Conductive hearing loss 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 04/18/2021   Acute nonintractable headache 04/18/2021   Ear pressure, left 04/15/2021   Microalbuminuria 04/05/2021   Uncontrolled type 2 diabetes mellitus with hyperglycemia ([place]) 04/05/2021   Arthralgia 04/05/2021   Nausea 04/05/2021   Abscess 01/27/2020   COVID-19 virus infection 01/27/2020   Anemia 01/27/2020   Abscess of left elbow 07/03/2019   No energy 12/30/2018   Breast pain, right 12/30/2018   Dyslipidemia (high LDL; low HDL) 03/20/2018   Hidradenitis suppurativa 11/09/2017   Hyperpigmentation of skin, postinflammatory 11/09/2017   Acanthosis nigricans 11/09/2017   Chronic left-sided low back pain without  sciatica 07/08/2017   Anxiety 07/08/2017   Chronic fatigue syndrome 02/18/2017   Morbidly obese ([place]) 10/20/2016   Vitamin D deficiency 10/03/2016   Absolute anemia 09/07/2015   Insomnia 06/20/2015   Type 2 diabetes mellitus, controlled ([place]) 06/08/2014   Crohn's disease ([place]) 03/30/2014   Menorrhagia 07/03/2013   MIGRAINE HEADACHE 06/28/2010   Dyslipidemia, goal LDL below 70 04/27/2010   Iron deficiency anemia 01/21/2010   WEIGHT GAIN 11/13/2008   HYPERTENSION, BENIGN ESSENTIAL 04/26/2006    Conditions to be addressed/monitored: HTN, HLD, and DMII  There are no care plans that you recently modified to display for this patient.    Medication Assistance:  None required.  Patient affirms current coverage meets needs.  Follow Up:  Patient agrees to Care Plan and Follow-up.  Plan: Telephone  follow up appointment with care management team member scheduled for:  1 month  Larinda Buttery, PharmD Clinical Pharmacist [facility] [phone]

## 2021-06-09 NOTE — Patient Instructions (Signed)
Visit Information  Thank you for taking time to visit with me today. Please don't hesitate to contact me if I can be of assistance to you before our next scheduled telephone appointment.  Following are the goals we discussed today:   Patient Goals/Self-Care Activities Over the next 30 days, patient will:  take medications as prescribed and check glucose using glucometer once daily in AM before food, and 2-3x per week 1-2hours after a meal, AND a few times in evening after dinner or late night snacking. Please document, and provide at future appointments  Follow Up Plan: Telephone follow up appointment with care management team member scheduled for:  1 month  Please call the care guide team at [phone] if you need to cancel or reschedule your appointment.    Patient verbalizes understanding of instructions provided today and agrees to view in [place].   [name]

## 2021-06-20 ENCOUNTER — Other Ambulatory Visit: Payer: Self-pay | Admitting: Physician Assistant

## 2021-06-20 DIAGNOSIS — R110 Nausea: Secondary | ICD-10-CM

## 2021-06-20 MED ORDER — ONDANSETRON 8 MG PO TBDP: 8.0000 mg | ORAL_TABLET | Freq: Three times a day (TID) | ORAL | 0 refills | Status: DC | PRN

## 2021-06-22 ENCOUNTER — Encounter: Payer: Self-pay | Admitting: Family

## 2021-06-27 ENCOUNTER — Other Ambulatory Visit: Payer: Self-pay | Admitting: Physician Assistant

## 2021-06-27 DIAGNOSIS — I10 Essential (primary) hypertension: Secondary | ICD-10-CM

## 2021-06-29 ENCOUNTER — Encounter: Payer: Self-pay | Admitting: Physician Assistant

## 2021-06-29 ENCOUNTER — Ambulatory Visit (INDEPENDENT_AMBULATORY_CARE_PROVIDER_SITE_OTHER): Payer: PRIVATE HEALTH INSURANCE | Admitting: Physician Assistant

## 2021-06-29 ENCOUNTER — Other Ambulatory Visit: Payer: Self-pay

## 2021-06-29 VITALS — BP 130/52 | HR 102 | Ht 64.0 in | Wt 208.0 lb

## 2021-06-29 DIAGNOSIS — I10 Essential (primary) hypertension: Secondary | ICD-10-CM | POA: Diagnosis not present

## 2021-06-29 DIAGNOSIS — E1165 Type 2 diabetes mellitus with hyperglycemia: Secondary | ICD-10-CM

## 2021-06-29 DIAGNOSIS — M791 Myalgia, unspecified site: Secondary | ICD-10-CM | POA: Diagnosis not present

## 2021-06-29 DIAGNOSIS — E118 Type 2 diabetes mellitus with unspecified complications: Secondary | ICD-10-CM | POA: Diagnosis not present

## 2021-06-29 LAB — POCT GLYCOSYLATED HEMOGLOBIN (HGB A1C): Hemoglobin A1C: 6.5 % — AB (ref 4.0–5.6)

## 2021-06-29 MED ORDER — EMPAGLIFLOZIN 10 MG PO TABS: 10.0000 mg | ORAL_TABLET | Freq: Every day | ORAL | 0 refills | Status: DC

## 2021-06-29 MED ORDER — SEMAGLUTIDE (1 MG/DOSE) 4 MG/3ML ~~LOC~~ SOPN: 1.0000 mg | PEN_INJECTOR | SUBCUTANEOUS | 0 refills | Status: DC

## 2021-06-29 NOTE — Patient Instructions (Signed)
Stop crestor for 2 weeks for clean out.

## 2021-06-29 NOTE — Progress Notes (Signed)
Subjective:    Patient ID: Stacie Cardenas, female    DOB: 10/23/1980, 41 y.o.   MRN: [id number]  HPI Pt is a 41 yo obese female with T2DM, Crohn's, HTN, HLD,CFS who presents to the clinic for 3 month follow up.   Pt has been taking ozempic and jardiance. She has been checking her sugars some and reports readings to pharmacist but does not have any today. No hypoglycemic events. No open sores or wounds. She is a little nauseated right after taking ozempic but no other side effects. Denies any CP, palpitations, headaches, or vision changes.   She is having more leg and arm aching since starting crestor.   .. Active Ambulatory Problems    Diagnosis Date Noted   Dyslipidemia, goal LDL below 70 04/27/2010   Iron deficiency anemia 01/21/2010   HYPERTENSION, BENIGN ESSENTIAL 04/26/2006   WEIGHT GAIN 11/13/2008   MIGRAINE HEADACHE 06/28/2010   Menorrhagia 07/03/2013   Crohn's disease ([place]) 03/30/2014   Type 2 diabetes mellitus, controlled ([place]) 06/08/2014   Insomnia 06/20/2015   Absolute anemia 09/07/2015   Vitamin D deficiency 10/03/2016   Morbidly obese ([place]) 10/20/2016   Chronic fatigue syndrome 02/18/2017   Chronic left-sided low back pain without sciatica 07/08/2017   Anxiety 07/08/2017   Hidradenitis suppurativa 11/09/2017   Hyperpigmentation of skin, postinflammatory 11/09/2017   Acanthosis nigricans 11/09/2017   Dyslipidemia (high LDL; low HDL) 03/20/2018   No energy 12/30/2018   Breast pain, right 12/30/2018   Abscess of left elbow 07/03/2019   Abscess 01/27/2020   COVID-19 virus infection 01/27/2020   Anemia 01/27/2020   Microalbuminuria 04/05/2021   Uncontrolled type 2 diabetes mellitus with hyperglycemia ([place]) 04/05/2021   Arthralgia 04/05/2021   Nausea 04/05/2021   Ear pressure, left 04/15/2021   Conductive hearing loss 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 04/18/2021   Acute nonintractable headache 04/18/2021   Crohn's disease of large intestine  without complication ([place]) 50/56/9794   Long-term current use of high risk medication other than anticoagulant 07/05/2018   Myalgia 06/29/2021   Resolved Ambulatory Problems    Diagnosis Date Noted   ABSCESS OF BARTHOLINS GLAND 02/19/2009   CARBUNCLE/FURUNCLE NOS 04/26/2006   SKIN RASH 11/13/2008   SYMPTOM, FREQUENCY, URINARY 04/26/2006   Abdominal pain, epigastric 04/27/2010   Impaired fasting glucose 80/16/5537   NEOPLASM UNCERTAIN BHV OTH&UNSPEC FE GENIT ORGN 08/10/2010   VAGINAL DISCHARGE 48/27/0786   FOLLICULITIS 75/44/9201   SORE THROAT 01/10/2011   ABDOMINAL PAIN 01/10/2011   Hyperlipidemia 10/14/2012   Obesity, unspecified 10/14/2012   Other iron deficiency anemias 03/17/2013   Elevated hemoglobin A1c 05/11/2014   Abscess of left axilla 12/31/2014   Bilateral lower abdominal pain 09/07/2015   Ruptured ear drum, right 08/29/2017   Past Medical History:  Diagnosis Date   Hypercholesterolemia    Hypertension    IBS (irritable bowel syndrome)    Other specified iron deficiency anemias 03/17/2013     Review of Systems See HPI.     Objective:   Physical Exam Vitals reviewed.  Constitutional:      Appearance: Normal appearance. She is obese.  HENT:     Head: Normocephalic.  Neck:     Vascular: No carotid bruit.  Cardiovascular:     Rate and Rhythm: Normal rate and regular rhythm.     Pulses: Normal pulses.  Pulmonary:     Effort: Pulmonary effort is normal.     Breath sounds: Normal breath sounds.  Abdominal:  General: Bowel sounds are normal.     Palpations: Abdomen is soft.  Musculoskeletal:     Right lower leg: No edema.     Left lower leg: No edema.  Lymphadenopathy:     Cervical: No cervical adenopathy.  Neurological:     General: No focal deficit present.     Mental Status: She is alert and oriented to person, place, and time.  Psychiatric:        Mood and Affect: Mood normal.      .. Results for orders placed or performed in visit on  06/29/21  POCT glycosylated hemoglobin (Hb A1C)  Result Value Ref Range   Hemoglobin A1C 6.5 (A) 4.0 - 5.6 %   HbA1c POC (<> result, manual entry)     HbA1c, POC (prediabetic range)     HbA1c, POC (controlled diabetic range)     .Marland Kitchen Depression screen [facility] 2/9 04/04/2021 12/30/2018 11/07/2017 09/24/2013 06/23/2013  Decreased Interest 0 0 0 0 0  Down, Depressed, Hopeless 0 0 0 0 0  PHQ - 2 Score 0 0 0 0 0  Altered sleeping 0 - - - -  Tired, decreased energy 0 - - - -  Change in appetite 0 - - - -  Feeling bad or failure about yourself  0 - - - -  Trouble concentrating 0 - - - -  Moving slowly or fidgety/restless 0 - - - -  Suicidal thoughts 0 - - - -  PHQ-9 Score 0 - - - -  Difficult doing work/chores Not difficult at all - - - -   .Marland Kitchen GAD 7 : Generalized Anxiety Score 04/04/2021  Nervous, Anxious, on Edge 0  Control/stop worrying 0  Worry too much - different things 0  Trouble relaxing 0  Restless 0  Easily annoyed or irritable 0  Afraid - awful might happen 0  Total GAD 7 Score 0  Anxiety Difficulty Not difficult at all        Assessment & Plan:  Marland KitchenMarland KitchenAileen was seen today for follow-up.  Diagnoses and all orders for this visit:  Controlled type 2 diabetes mellitus with complication, without long-term current use of insulin (HCC) -     POCT glycosylated hemoglobin (Hb A1C) -     empagliflozin (JARDIANCE) 10 MG TABS tablet; Take 1 tablet (10 mg total) by mouth daily. -     Semaglutide, 1 MG/DOSE, 4 MG/3ML SOPN; Inject 1 mg as directed once a week.  HYPERTENSION, BENIGN ESSENTIAL  Myalgia   A1C made a huge improvement to 6.5. GREAT JOb.  Continue ozempic and jardiance. BP to goal continue losartan On statin but likely causing myalgia hold for 2 weeks and see if myalgia resolves then we can decide about a plan for her Eye and foot exam UTD.  Covid/flu/pneumonia vaccine UTD.  Follow up in 3 months.

## 2021-07-12 ENCOUNTER — Other Ambulatory Visit: Payer: Self-pay

## 2021-07-12 ENCOUNTER — Ambulatory Visit: Payer: PRIVATE HEALTH INSURANCE | Admitting: Pharmacist

## 2021-07-12 DIAGNOSIS — I10 Essential (primary) hypertension: Secondary | ICD-10-CM

## 2021-07-12 DIAGNOSIS — E118 Type 2 diabetes mellitus with unspecified complications: Secondary | ICD-10-CM

## 2021-07-12 DIAGNOSIS — E785 Hyperlipidemia, unspecified: Secondary | ICD-10-CM

## 2021-07-12 NOTE — Progress Notes (Signed)
Care Management   Pharmacy Note  07/12/2021 Name: Stacie Cardenas MRN: [id number] DOB: 24-Aug-1980   Summary: addressed HTN, HLD, and primarily DM.  She remains on ozempic 46m weekly, still reports some nausea/heartburn but is relieved with zofran & tums, reports it is not too bothersome for her.  A1c 6.5 at last PCP visit, stopped crestor for 2 weeks under guidance of PCP due to myalgias.   BG as follows: 1/16 132 - after dinner  1/23  132 - AM fasting 1/24 123 - after dinner 1/26 136 - after dinner   Recommendations/Changes made from today's visit: - No changes, continue ozempic at 182mweekly - Please obtain fasting lipid panel, may restart crestor dosing at every other day if desires, but advised patient to stop & let usKoreanow if the muscle aches return - If LDL remains >70, first would attempt crestor 1046mvery other day, then addition of zetia 42m38mily, and if those do not reach goal LDL <70, would consider PCSK9 inhibitor.   Plan: f/u with pharmacist in 3-4 months  Subjective: Stacie DOWNIEa 41 y68. year old female who is a primary care patient of Stacie Cardenas. The Care Management team was consulted for assistance with care management and care coordination needs.     Engaged with patient by telephone for follow up visit in response to provider referral for pharmacy case management and/or care coordination services.   The patient was given information about Care Management services today including:  Care Management services includes personalized support from designated clinical staff supervised by the patient's primary care provider, including individualized plan of care and coordination with other care providers. 24/7 contact phone numbers for assistance for urgent and routine care needs. The patient may stop case management services at any time by phone call to the office staff.  Patient agreed to services and consent obtained.  Assessment:  Review of  patient status, including review of consultants reports, laboratory and other test data, was performed as part of comprehensive evaluation and provision of chronic care management services.   SDOH (Social Determinants of Health) assessments and interventions performed:    Objective:  Lab Results  Component Value Date   CREATININE 0.62 04/04/2021   CREATININE 0.91 12/27/2018   CREATININE 0.9 02/02/2017    Lab Results  Component Value Date   HGBA1C 6.5 (A) 06/29/2021       Component Value Date/Time   CHOL 295 (H) 04/04/2021 1059   TRIG 131 04/04/2021 1059   HDL 65 04/04/2021 1059   CHOLHDL 4.5 04/04/2021 1059   VLDL 23 05/16/2016 1005   LDLCALC 202 (H) 04/04/2021 1059     Clinical ASCVD:  The 10-year ASCVD risk score (Arnett DK, et al., 2019) is: 2.3%   Values used to calculate the score:     Age: 41 y38rs     Sex: Female     Is Non-Hispanic African American: Yes     Diabetic: Yes     Tobacco smoker: No     Systolic Blood Pressure: 119 413g     Is BP treated: Yes     HDL Cholesterol: 65 mg/dL     Total Cholesterol: 295 mg/dL    BP Readings from Last 3 Encounters:  06/29/21 (!) 130/52  04/15/21 130/84  04/04/21 (!) 160/90    Care Plan  Allergies  Allergen Reactions   Metformin Other (See Comments)    Vaginal yeast infections   Ampicillin Diarrhea  Doxycycline     Nausea/vomiting   Xigduo Xr [Dapagliflozin-Metformin Hcl Er]     Yeast infections.     Medications Reviewed Today     Reviewed by Stacie Cardenas, [facility] (Pharmacist) on 07/12/21 at 1437  Med List Status: <None>   Medication Order Taking? Sig Documenting Provider Last Dose Status Informant  blood glucose meter kit and supplies KIT [id number] No Dispense based on patient and insurance preference. Use up to four times daily as directed. Please include lancets, test strips, control solution. Stacie Stade, PA-C Taking Active   empagliflozin (JARDIANCE) 10 MG TABS tablet [id number]  Take 1 tablet  (10 mg total) by mouth daily. Cardenas, Stacie L, PA-C  Active   fluticasone (FLONASE) 50 MCG/ACT nasal spray [id number] No Place 2 sprays into both nostrils daily. Stacie Stade, PA-C Taking Active   losartan (COZAAR) 50 MG tablet [id number] No Take 1 tablet (50 mg total) by mouth daily. Stacie Stade, PA-C Taking Active   ondansetron (ZOFRAN ODT) 8 MG disintegrating tablet [id number]  Take 1 tablet (8 mg total) by mouth every 8 (eight) hours as needed for nausea or vomiting. Cardenas, Stacie L, PA-C  Active   rosuvastatin (CRESTOR) 10 MG tablet [id number] No Take 1 tablet (10 mg total) by mouth daily. Stacie Stade, PA-C Taking Active   Semaglutide, 1 MG/DOSE, 4 MG/3ML SOPN [id number]  Inject 1 mg as directed once a week. Cardenas, Stacie L, PA-C  Active   Vitamin D, Ergocalciferol, (DRISDOL) 1.25 MG (50000 UNIT) CAPS capsule [id number] No Take 1 capsule (50,000 Units total) by mouth every 7 (seven) days. Stacie Stade, PA-C Taking Active            Med Note Stacie Cardenas Apr 20, 2021  1:26 PM) Taking on Saturdays             Patient Active Problem List   Diagnosis Date Noted   Myalgia 06/29/2021   Conductive hearing loss 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 04/18/2021   Acute nonintractable headache 04/18/2021   Ear pressure, left 04/15/2021   Microalbuminuria 04/05/2021   Uncontrolled type 2 diabetes mellitus with hyperglycemia ([place]) 04/05/2021   Arthralgia 04/05/2021   Nausea 04/05/2021   Abscess 01/27/2020   COVID-19 virus infection 01/27/2020   Anemia 01/27/2020   Abscess of left elbow 07/03/2019   No energy 12/30/2018   Breast pain, right 12/30/2018   Long-term current use of high risk medication other than anticoagulant 07/05/2018   Dyslipidemia (high LDL; low HDL) 03/20/2018   Hidradenitis suppurativa 11/09/2017   Hyperpigmentation of skin, postinflammatory 11/09/2017   Acanthosis nigricans 11/09/2017   Chronic left-sided low back pain without  sciatica 07/08/2017   Anxiety 07/08/2017   Chronic fatigue syndrome 02/18/2017   Morbidly obese ([place]) 10/20/2016   Vitamin D deficiency 10/03/2016   Absolute anemia 09/07/2015   Insomnia 06/20/2015   Type 2 diabetes mellitus, controlled ([place]) 06/08/2014   Crohn's disease ([place]) 03/30/2014   Crohn's disease of large intestine without complication ([place]) 59/29/2446   Menorrhagia 07/03/2013   MIGRAINE HEADACHE 06/28/2010   Dyslipidemia, goal LDL below 70 04/27/2010   Iron deficiency anemia 01/21/2010   WEIGHT GAIN 11/13/2008   HYPERTENSION, BENIGN ESSENTIAL 04/26/2006    Conditions to be addressed/monitored: HTN, HLD, and DMII  Care Plan : Medication Management  Updates made by Stacie Cardenas, [place] since 07/12/2021 12:00 AM     Problem: DM, HTN, HLD  Long-Range Goal: Disease Progression Prevention   Start Date: 04/20/2021  Recent Progress: On track  Priority: High  Note:   Current Barriers:  Unable to achieve control of diabetes, HLD  Does not adhere to prescribed medication regimen  Pharmacist Clinical Goal(s):  Over the next 14 days, patient will adhere to plan to optimize therapeutic regimen for diabetes and hyperlipidemia as evidenced by report of adherence to recommended medication management changes through collaboration with PharmD and provider.   Interventions: 1:1 collaboration with Stacie Stade, PA-C regarding development and update of comprehensive plan of care as evidenced by provider attestation and co-signature Inter-disciplinary care team collaboration (see longitudinal plan of care) Comprehensive medication review performed; medication list updated in electronic medical record  Diabetes:   Uncontrolled; current treatment:jardiance 62m daily, ozempic 135mweekly; a1c 6.5 (down from 11.3)  Current glucose readings: BG as follows: 1/16 132 - after dinner  1/23  132 - AM fasting 1/24 123 - after dinner 1/26 136 - after dinner    Date Fasting After  Breakfast  06/01/2021 97 119  06/02/2021 107 155  06/03/2021 102 166  06/04/2021 144   06/05/2021  144  06/06/2021 116   06/07/2021 130 110  06/08/2021 132 114  06/09/2021 133 106   Reports hyperglycemic symptoms  Current meal patterns: breakfast: bacon, eggs, toast, oatmeal + fruit; lunch: grilled BBQ chicken & corn; dinner: air fryer or grill food, salmon, peas, broccoli; snacks: jello w/fruit, nuts (careful w/ crohns), chips, cheezits, "bad w/ sweets"; drinks: dr pepper ~1 per day but not daily, water,   Current exercise: walks  Counseled on mechanism of action of medications, importance of proper glycemic control and downstream effects Recommended continue ozempic 54m30meekly,  Hypertension:  Controlled; current treatment:losartan 38m55mily;   Current home readings: SBP 120-130s  Denies hypotensive/hypertensive symptoms  Recommended continue current regimen Hyperlipidemia:  Uncontrolled; current treatment: rosuvastatin 10mg34mly, stopped x 2 weeks under guidance of PCP due to muscle aches  Recommended the following:  If LDL remains >70, first would attempt crestor 10mg 77my other day, then addition of zetia 10mg d34m, and if those do not reach goal LDL <70, would consider PCSK9 inhibitor.  Patient Goals/Self-Care Activities Over the next 30 days, patient will:  take medications as prescribed and check glucose using glucometer once weekly in AM before food, and 1-2hours after a meal, document, and provide at future appointments  Follow Up Plan: Telephone follow up appointment with care management team member scheduled for:  3-4 months      Medication Assistance:  None required.  Patient affirms current coverage meets needs.  Follow Up:  Patient agrees to Care Plan and Follow-up.  Plan: Telephone follow up appointment with care management team member scheduled for:  3-4 month  Kalena Mander Larinda ButteryD Clinical Pharmacist [phone]

## 2021-07-12 NOTE — Patient Instructions (Signed)
Visit Information  Thank you for taking time to visit with me today. Please don't hesitate to contact me if I can be of assistance to you before our next scheduled telephone appointment.  Following are the goals we discussed today:  Patient Goals/Self-Care Activities Over the next 30 days, patient will:  take medications as prescribed and check glucose using glucometer once weekly in AM before food, and 1-2hours after a meal, document, and provide at future appointments  Follow Up Plan: Telephone follow up appointment with care management team member scheduled for:  3-4 months  Please call the care guide team at [phone] if you need to cancel or reschedule your appointment.   Patient verbalizes understanding of instructions and care plan provided today and agrees to view in [place]. Active MyChart status confirmed with patient.    Stacie Cardenas

## 2021-10-04 ENCOUNTER — Ambulatory Visit (INDEPENDENT_AMBULATORY_CARE_PROVIDER_SITE_OTHER): Payer: PRIVATE HEALTH INSURANCE | Admitting: Physician Assistant

## 2021-10-04 VITALS — BP 122/80 | HR 80 | Ht 64.0 in | Wt 208.0 lb

## 2021-10-04 DIAGNOSIS — Z0184 Encounter for antibody response examination: Secondary | ICD-10-CM

## 2021-10-04 DIAGNOSIS — E118 Type 2 diabetes mellitus with unspecified complications: Secondary | ICD-10-CM

## 2021-10-04 DIAGNOSIS — E785 Hyperlipidemia, unspecified: Secondary | ICD-10-CM

## 2021-10-04 DIAGNOSIS — K5000 Crohn's disease of small intestine without complications: Secondary | ICD-10-CM

## 2021-10-04 DIAGNOSIS — Z7185 Encounter for immunization safety counseling: Secondary | ICD-10-CM

## 2021-10-04 LAB — POCT GLYCOSYLATED HEMOGLOBIN (HGB A1C): Hemoglobin A1C: 5.7 % — AB (ref 4.0–5.6)

## 2021-10-04 MED ORDER — EMPAGLIFLOZIN 10 MG PO TABS: 10.0000 mg | ORAL_TABLET | Freq: Every day | ORAL | 0 refills | Status: DC

## 2021-10-04 MED ORDER — VEDOLIZUMAB 300 MG IV SOLR: 300.0000 mg | INTRAVENOUS | Status: AC

## 2021-10-04 MED ORDER — SEMAGLUTIDE (1 MG/DOSE) 4 MG/3ML ~~LOC~~ SOPN: 1.0000 mg | PEN_INJECTOR | SUBCUTANEOUS | 0 refills | Status: DC

## 2021-10-04 NOTE — Progress Notes (Signed)
? ?Established Patient Office Visit ? ?Subjective   ?Patient ID: Stacie Cardenas, female    DOB: [birth year]  Age: 41 y.o. MRN: [id number] ? ?Chief Complaint  ?Patient presents with  ? Follow-up  ? Diabetes  ? ? ?HPI ?Pt is a 41 yo obese female with T2DM, HLD, Crohn's who presents to the clinic for follow up.  ? ?She is not checking her sugars. She is taking ozempic and jardiance. She is trying to do better with diet and exercise. She continues to feel like crestor makes her legs ache and has not been taking it. Denies any CP, palpitations, headaches or vision changes. She is taking cozaar and BP when she has checked have been much better.  ? ?She is doing great with no crohn flare. She is on new infusion.  ? ?She is concerned about taking covid vaccine that her school is requiring. She is exempt at work but they want her to take for school. She brings in paperwork.  ? ?Patient Active Problem List  ? Diagnosis Date Noted  ? Immunity status testing 10/04/2021  ? Crohn's disease of small intestine without complication ([place]) 47/65/4650  ? Vaccine counseling 10/04/2021  ? Myalgia 06/29/2021  ? Conductive hearing loss 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 04/18/2021  ? Acute nonintractable headache 04/18/2021  ? Ear pressure, left 04/15/2021  ? Microalbuminuria 04/05/2021  ? Uncontrolled type 2 diabetes mellitus with hyperglycemia ([place]) 04/05/2021  ? Arthralgia 04/05/2021  ? Nausea 04/05/2021  ? Abscess 01/27/2020  ? COVID-19 virus infection 01/27/2020  ? Anemia 01/27/2020  ? Abscess of left elbow 07/03/2019  ? No energy 12/30/2018  ? Breast pain, right 12/30/2018  ? Long-term current use of high risk medication other than anticoagulant 07/05/2018  ? Dyslipidemia (high LDL; low HDL) 03/20/2018  ? Hidradenitis suppurativa 11/09/2017  ? Hyperpigmentation of skin, postinflammatory 11/09/2017  ? Acanthosis nigricans 11/09/2017  ? Chronic left-sided low back pain without sciatica 07/08/2017  ? Anxiety 07/08/2017  ?  Chronic fatigue syndrome 02/18/2017  ? Morbidly obese ([place]) 10/20/2016  ? Vitamin D deficiency 10/03/2016  ? Absolute anemia 09/07/2015  ? Insomnia 06/20/2015  ? Type 2 diabetes mellitus, controlled ([place]) 06/08/2014  ? Crohn's disease ([place]) 03/30/2014  ? Crohn's disease of large intestine without complication ([place]) 35/46/5681  ? Menorrhagia 07/03/2013  ? MIGRAINE HEADACHE 06/28/2010  ? Dyslipidemia, goal LDL below 70 04/27/2010  ? Iron deficiency anemia 01/21/2010  ? WEIGHT GAIN 11/13/2008  ? HYPERTENSION, BENIGN ESSENTIAL 04/26/2006  ? ?Past Medical History:  ?Diagnosis Date  ? Crohn's disease ([place]) 03/30/2014  ? Biopsies done 03/23/14.  Digestive health management.    ? Hypercholesterolemia   ? Hypertension   ? IBS (irritable bowel syndrome)   ? Other specified iron deficiency anemias 03/17/2013  ? Type 2 diabetes mellitus, controlled ([place]) 06/08/2014  ? ?Allergies  ?Allergen Reactions  ? Metformin Other (See Comments)  ?  Vaginal yeast infections  ? Ampicillin Diarrhea  ? Doxycycline   ?  Nausea/vomiting  ? Rosuvastatin Other (See Comments)  ? Xigduo Xr [Dapagliflozin-Metformin Hcl Er]   ?  Yeast infections.   ? ?  ? ?Review of Systems  ?All other systems reviewed and are negative. ? ?  ?Objective:  ?  ? ?BP 122/80   Pulse 80   Ht 5' 4"  (1.626 m)   Wt 208 lb (94.3 kg)   SpO2 99%   BMI 35.70 kg/m?  ?BP Readings from Last 3 Encounters:  ?10/04/21 122/80  ?  06/29/21 (!) 130/52  ?04/15/21 130/84  ? ?  ? ?Physical Exam ?Vitals reviewed.  ?Constitutional:   ?   Appearance: Normal appearance.  ?HENT:  ?   Head: Normocephalic.  ?Cardiovascular:  ?   Rate and Rhythm: Normal rate and regular rhythm.  ?   Pulses: Normal pulses.  ?   Heart sounds: Normal heart sounds.  ?Pulmonary:  ?   Effort: Pulmonary effort is normal.  ?   Breath sounds: Normal breath sounds.  ?Musculoskeletal:  ?   Right lower leg: No edema.  ?   Left lower leg: No edema.  ?Neurological:  ?   General: No focal deficit present.  ?   Mental Status: She  is alert and oriented to person, place, and time.  ?Psychiatric:     ?   Mood and Affect: Mood normal.  ? ? ? ?Results for orders placed or performed in visit on 10/04/21  ?POCT glycosylated hemoglobin (Hb A1C)  ?Result Value Ref Range  ? Hemoglobin A1C 5.7 (A) 4.0 - 5.6 %  ? HbA1c POC (<> result, manual entry)    ? HbA1c, POC (prediabetic range)    ? HbA1c, POC (controlled diabetic range)    ? ? ?Last lipids ?Lab Results  ?Component Value Date  ? CHOL 295 (H) 04/04/2021  ? HDL 65 04/04/2021  ? LDLCALC 202 (H) 04/04/2021  ? TRIG 131 04/04/2021  ? CHOLHDL 4.5 04/04/2021  ? ?Last hemoglobin A1c ?Lab Results  ?Component Value Date  ? HGBA1C 5.7 (A) 10/04/2021  ? ?  ? ?The 10-year ASCVD risk score (Arnett DK, et al., 2019) is: 2.6% ?  ?Assessment & Plan:  ?..Paola was seen today for follow-up and diabetes. ? ?Diagnoses and all orders for this visit: ? ?Controlled type 2 diabetes mellitus with complication, without long-term current use of insulin (HCC) ?-     POCT glycosylated hemoglobin (Hb A1C) ?-     empagliflozin (JARDIANCE) 10 MG TABS tablet; Take 1 tablet (10 mg total) by mouth daily. ?-     Semaglutide, 1 MG/DOSE, 4 MG/3ML SOPN; Inject 1 mg as directed once a week. ? ?Dyslipidemia (high LDL; low HDL) ?-     Lipid Panel w/reflex Direct LDL ? ?Immunity status testing ?-     QuantiFERON-TB Gold Plus ? ?Crohn's disease of small intestine without complication (HCC) ?-     vedolizumab (ENTYVIO) 300 MG injection; Inject 300 mg into the vein every 30 (thirty) days. ? ?Vaccine counseling ? ? ? ? ? ?A1C improved ?Continue same medications ?BP looks great ?Recheck lipids to see how crestor every other day is doing.  ?Eye and foot exam UTD. ?Pneumonia/Flu/Tdap UTD ?Follow up in 3 months  ? ? ?TB testing via labs ordered for school with paperwork ? ?Discussed covid vaccine. I cannot exempt her since she has had no allergic reaction but I did write a note that rheumatology is working with her infusion and does not want to  anything to interfer.  ? ?Return in about 3 months (around 01/03/2022).  ? ? ?[name], PA-C ? ?

## 2021-10-06 LAB — LIPID PANEL W/REFLEX DIRECT LDL
Cholesterol: 262 mg/dL — ABNORMAL HIGH (ref <200)
HDL: 56 mg/dL (ref >=50)
LDL Cholesterol (Calc): 180 mg/dL (calc) — ABNORMAL HIGH
Non-HDL Cholesterol (Calc): 206 mg/dL (calc) — ABNORMAL HIGH (ref <130)
Total CHOL/HDL Ratio: 4.7 (calc) (ref <5.0)
Triglycerides: 126 mg/dL (ref <150)

## 2021-10-06 NOTE — Progress Notes (Signed)
Cholesterol did improve some but not where we need it to be. I know you are taking crestor. What other statin did you try? I think we need to try and get the every 2 week injectable, repatha, approved to lower your LDL.

## 2021-10-07 ENCOUNTER — Encounter: Payer: Self-pay | Admitting: Physician Assistant

## 2021-10-22 LAB — QUANTIFERON-TB GOLD PLUS
Mitogen-NIL: >10 IU/mL
NIL: 0.03 IU/mL
QuantiFERON-TB Gold Plus: NEGATIVE
TB1-NIL: 0.08 IU/mL
TB2-NIL: 0.05 IU/mL

## 2021-10-24 NOTE — Progress Notes (Signed)
Negative TB screen.

## 2021-11-09 ENCOUNTER — Ambulatory Visit: Payer: PRIVATE HEALTH INSURANCE | Admitting: Pharmacist

## 2021-11-09 DIAGNOSIS — E785 Hyperlipidemia, unspecified: Secondary | ICD-10-CM

## 2021-11-09 DIAGNOSIS — I10 Essential (primary) hypertension: Secondary | ICD-10-CM

## 2021-11-09 DIAGNOSIS — E118 Type 2 diabetes mellitus with unspecified complications: Secondary | ICD-10-CM

## 2021-11-09 MED ORDER — REPATHA SURECLICK 140 MG/ML ~~LOC~~ SOAJ: 140.0000 mg | SUBCUTANEOUS | 11 refills | Status: DC

## 2021-11-09 MED ORDER — FLUCONAZOLE 150 MG PO TABS
150.0000 mg | ORAL_TABLET | Freq: Once | ORAL | 1 refills | Status: AC
Start: 2021-11-09 — End: 2021-11-09

## 2021-11-09 NOTE — Addendum Note (Signed)
Addended by: [name] on: 11/09/2021 03:13 PM   Modules accepted: Orders

## 2021-11-09 NOTE — Progress Notes (Addendum)
Care Management   Pharmacy Note  11/09/2021 Name: ERMALINDA Cardenas MRN: [id number] DOB: [birth year]   Summary: addressed HTN, HLD, and primarily DM.  She remains on ozempic 59m weekly, still reports some nausea/heartburn but is relieved with zofran & tums, reports it is not too bothersome for her. A1c 11.3 down to 5.7 with ozempic and jardiance.   She describes some mild yeast-related GU symptoms (possible side effect of jardiance), asking for PRN fluconazole to have on hand as summer approaches.  Stopped taking crestor altogether due to myalgias.   Recommendations/Changes made from today's visit: - No DM changes, continue ozempic at 111mweekly & jardiance 1065maily  -Consider RX for PRN fluconazole, at PCP discretion. -Counseled patient on need to be evaluated if symptoms worsen, persist, or become frequent. Counseled on adequate water intake and importance of hydration related to jardiance & her symptoms. - Recommend RX for repatha. Counseled patient on new medication, what to expect, and goal of reducing LDL as surrogate marker for heart attack/stroke risks. - Recommend repeat urine microalbumin at next office visit     Plan: f/u with pharmacist in 1 month  Subjective: Stacie Cardenas a [age]o. year old female who is a primary care patient of BreDonella StadeA-C. The Care Management team was consulted for assistance with care management and care coordination needs.     Engaged with patient by telephone for follow up visit in response to provider referral for pharmacy case management and/or care coordination services.   The patient was given information about Care Management services today including:  Care Management services includes personalized support from designated clinical staff supervised by the patient's primary care provider, including individualized plan of care and coordination with other care providers. 24/7 contact phone numbers for assistance for urgent and  routine care needs. The patient may stop case management services at any time by phone call to the office staff.  Patient agreed to services and consent obtained.  Assessment:  Review of patient status, including review of consultants reports, laboratory and other test data, was performed as part of comprehensive evaluation and provision of chronic care management services.   SDOH (Social Determinants of Health) assessments and interventions performed:    Objective:  Lab Results  Component Value Date   CREATININE 0.62 04/04/2021   CREATININE 0.91 12/27/2018   CREATININE 0.9 02/02/2017    Lab Results  Component Value Date   HGBA1C 5.7 (A) 10/04/2021       Component Value Date/Time   CHOL 262 (H) 10/05/2021 0000   TRIG 126 10/05/2021 0000   HDL 56 10/05/2021 0000   CHOLHDL 4.7 10/05/2021 0000   VLDL 23 05/16/2016 1005   LDLCALC 180 (H) 10/05/2021 0000     Clinical ASCVD:  The 10-year ASCVD risk score (Arnett DK, et al., 2019) is: 4%   Values used to calculate the score:     Age: [age]     Sex: Female     Is Non-Hispanic African American: Yes     Diabetic: Yes     Tobacco smoker: No     Systolic Blood Pressure: 126400Hg     Is BP treated: Yes     HDL Cholesterol: 56 mg/dL     Total Cholesterol: 262 mg/dL    BP Readings from Last 3 Encounters:  10/04/21 122/80  06/29/21 (!) 130/52  04/15/21 130/84    Care Plan  Allergies  Allergen Reactions   Metformin Other (See  Comments)    Vaginal yeast infections   Ampicillin Diarrhea   Doxycycline     Nausea/vomiting   Rosuvastatin Other (See Comments)   Xigduo Xr [Dapagliflozin-Metformin Hcl Er]     Yeast infections.     Medications Reviewed Today     Reviewed by Lavada Mesi (Physician Assistant) on 10/07/21 at [phone]  Med List Status: <None>   Medication Order Taking? Sig Documenting Provider Last Dose Status Informant  blood glucose meter kit and supplies KIT [id number] Yes Dispense based on  patient and insurance preference. Use up to four times daily as directed. Please include lancets, test strips, control solution. [name], PA-C Taking Active   empagliflozin (JARDIANCE) 10 MG TABS tablet [id number]  Take 1 tablet (10 mg total) by mouth daily. Breeback, Jade L, PA-C  Active   fluticasone (FLONASE) 50 MCG/ACT nasal spray [id number] Yes Place 2 sprays into both nostrils daily. [name], PA-C Taking Active   losartan (COZAAR) 50 MG tablet [id number] Yes Take 1 tablet (50 mg total) by mouth daily. [name], PA-C Taking Active   ondansetron (ZOFRAN ODT) 8 MG disintegrating tablet [id number] Yes Take 1 tablet (8 mg total) by mouth every 8 (eight) hours as needed for nausea or vomiting. [name], PA-C Taking Active   rosuvastatin (CRESTOR) 10 MG tablet [id number] Yes Take 1 tablet (10 mg total) by mouth daily.  Patient taking differently: Take 10 mg by mouth every other day.   [name], PA-C Taking Active   Semaglutide, 1 MG/DOSE, 4 MG/3ML SOPN [id number]  Inject 1 mg as directed once a week. Breeback, Jade L, PA-C  Active   vedolizumab (ENTYVIO) 300 MG injection [id number] Yes Inject 300 mg into the vein every 30 (thirty) days. [name], PA-C  Active             Patient Active Problem List   Diagnosis Date Noted   Immunity status testing 10/04/2021   Crohn's disease of small intestine without complication ([place]) 35/46/5681   Vaccine counseling 10/04/2021   Myalgia 06/29/2021   Conductive hearing loss 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 04/18/2021   Acute nonintractable headache 04/18/2021   Ear pressure, left 04/15/2021   Microalbuminuria 04/05/2021   Uncontrolled type 2 diabetes mellitus with hyperglycemia ([place]) 04/05/2021   Arthralgia 04/05/2021   Nausea 04/05/2021   Abscess 01/27/2020   COVID-19 virus infection 01/27/2020   Anemia 01/27/2020   Abscess of left elbow 07/03/2019   No energy 12/30/2018   Breast pain,  right 12/30/2018   Long-term current use of high risk medication other than anticoagulant 07/05/2018   Dyslipidemia (high LDL; low HDL) 03/20/2018   Hidradenitis suppurativa 11/09/2017   Hyperpigmentation of skin, postinflammatory 11/09/2017   Acanthosis nigricans 11/09/2017   Chronic left-sided low back pain without sciatica 07/08/2017   Anxiety 07/08/2017   Chronic fatigue syndrome 02/18/2017   Morbidly obese ([place]) 10/20/2016   Vitamin D deficiency 10/03/2016   Absolute anemia 09/07/2015   Insomnia 06/20/2015   Type 2 diabetes mellitus, controlled ([place]) 06/08/2014   Crohn's disease ([place]) 03/30/2014   Crohn's disease of large intestine without complication ([place]) 27/51/7001   Menorrhagia 07/03/2013   MIGRAINE HEADACHE 06/28/2010   Dyslipidemia, goal LDL below 70 04/27/2010   Iron deficiency anemia 01/21/2010   WEIGHT GAIN 11/13/2008   HYPERTENSION, BENIGN ESSENTIAL 04/26/2006    Conditions to be addressed/monitored: HTN, HLD, and DMII  There are no care plans that you recently  modified to display for this patient.     Medication Assistance:  None required.  Patient affirms current coverage meets needs.  Follow Up:  Patient agrees to Care Plan and Follow-up.  Plan: Telephone follow up appointment with care management team member scheduled for:  1 month  Larinda Buttery, PharmD Clinical Pharmacist [facility] [phone]  Repatha and diflucan sent to pharmacy.

## 2021-11-09 NOTE — Patient Instructions (Signed)
Visit Information  Thank you for taking time to visit with me today. Please don't hesitate to contact me if I can be of assistance to you before our next scheduled telephone appointment.  Following are the goals we discussed today:  Patient Goals/Self-Care Activities Over the next 30 days, patient will:  take medications as prescribed  Follow Up Plan: Telephone follow up appointment with care management team member scheduled for:  1 month  Please call the care guide team at [phone] if you need to cancel or reschedule your appointment.    Patient verbalizes understanding of instructions and care plan provided today and agrees to view in [place]. Active MyChart status and patient understanding of how to access instructions and care plan via MyChart confirmed with patient.     Stacie Cardenas

## 2021-11-23 ENCOUNTER — Ambulatory Visit: Payer: PRIVATE HEALTH INSURANCE | Admitting: Physician Assistant

## 2021-11-23 ENCOUNTER — Encounter: Payer: Self-pay | Admitting: Physician Assistant

## 2021-11-23 VITALS — BP 132/80 | HR 85 | Ht 64.0 in | Wt 209.0 lb

## 2021-11-23 DIAGNOSIS — N6315 Unspecified lump in the right breast, overlapping quadrants: Secondary | ICD-10-CM

## 2021-11-23 DIAGNOSIS — R110 Nausea: Secondary | ICD-10-CM

## 2021-11-23 DIAGNOSIS — E785 Hyperlipidemia, unspecified: Secondary | ICD-10-CM

## 2021-11-23 MED ORDER — ONDANSETRON 8 MG PO TBDP: 8.0000 mg | ORAL_TABLET | Freq: Three times a day (TID) | ORAL | 0 refills | Status: DC | PRN

## 2021-11-23 MED ORDER — REPATHA SURECLICK 140 MG/ML ~~LOC~~ SOAJ: 140.0000 mg | SUBCUTANEOUS | 11 refills | Status: DC

## 2021-11-23 NOTE — Progress Notes (Signed)
Acute Office Visit  Subjective:     Patient ID: Stacie Cardenas, female    DOB: 1981-03-23, 41 y.o.   MRN: [id number]  Chief Complaint  Patient presents with   Breast Mass    HPI Patient is in today for right breast lump that she found about a week ago. Her husband could not feel it a week ago but last night he could. It is not tender. No nipple discharge, fever, chills, body aches or weight changes. No personal or family history of breast cancer. She is on biologics for crohns disease.   Would like zofran for intermittent nausea.   Her repatha was sent to wrong pharmacy needs sent to [facility].  .. Active Ambulatory Problems    Diagnosis Date Noted   Dyslipidemia, goal LDL below 70 04/27/2010   Iron deficiency anemia 01/21/2010   HYPERTENSION, BENIGN ESSENTIAL 04/26/2006   WEIGHT GAIN 11/13/2008   MIGRAINE HEADACHE 06/28/2010   Menorrhagia 07/03/2013   Crohn's disease ([place]) 03/30/2014   Type 2 diabetes mellitus, controlled ([place]) 06/08/2014   Insomnia 06/20/2015   Absolute anemia 09/07/2015   Vitamin D deficiency 10/03/2016   Morbidly obese ([place]) 10/20/2016   Chronic fatigue syndrome 02/18/2017   Chronic left-sided low back pain without sciatica 07/08/2017   Anxiety 07/08/2017   Hidradenitis suppurativa 11/09/2017   Hyperpigmentation of skin, postinflammatory 11/09/2017   Acanthosis nigricans 11/09/2017   Dyslipidemia (high LDL; low HDL) 03/20/2018   No energy 12/30/2018   Breast pain, right 12/30/2018   Abscess of left elbow 07/03/2019   Abscess 01/27/2020   COVID-19 virus infection 01/27/2020   Anemia 01/27/2020   Microalbuminuria 04/05/2021   Uncontrolled type 2 diabetes mellitus with hyperglycemia ([place]) 04/05/2021   Arthralgia 04/05/2021   Nausea 04/05/2021   Ear pressure, left 04/15/2021   Conductive hearing loss 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 04/18/2021   Acute nonintractable headache 04/18/2021   Crohn's disease of large intestine without  complication ([place]) 21/97/5883   Long-term current use of high risk medication other than anticoagulant 07/05/2018   Myalgia 06/29/2021   Immunity status testing 10/04/2021   Crohn's disease of small intestine without complication ([place]) 25/49/8264   Vaccine counseling 10/04/2021   Resolved Ambulatory Problems    Diagnosis Date Noted   ABSCESS OF BARTHOLINS GLAND 02/19/2009   CARBUNCLE/FURUNCLE NOS 04/26/2006   SKIN RASH 11/13/2008   SYMPTOM, FREQUENCY, URINARY 04/26/2006   Abdominal pain, epigastric 04/27/2010   Impaired fasting glucose 15/83/0940   NEOPLASM UNCERTAIN BHV OTH&UNSPEC FE GENIT ORGN 08/10/2010   VAGINAL DISCHARGE 76/80/8811   FOLLICULITIS 08/24/9456   SORE THROAT 01/10/2011   ABDOMINAL PAIN 01/10/2011   Hyperlipidemia 10/14/2012   Obesity, unspecified 10/14/2012   Other iron deficiency anemias 03/17/2013   Elevated hemoglobin A1c 05/11/2014   Abscess of left axilla 12/31/2014   Bilateral lower abdominal pain 09/07/2015   Ruptured ear drum, right 08/29/2017   Past Medical History:  Diagnosis Date   Hypercholesterolemia    Hypertension    IBS (irritable bowel syndrome)    Other specified iron deficiency anemias 03/17/2013      ROS  See HPI.     Objective:    BP 132/80   Pulse 85   Ht 5' 4"  (1.626 m)   Wt 209 lb (94.8 kg)   SpO2 98%   BMI 35.87 kg/m  BP Readings from Last 3 Encounters:  11/23/21 132/80  10/04/21 122/80  06/29/21 (!) 130/52   Wt Readings from Last 3 Encounters:  11/23/21 209 lb (94.8 kg)  10/04/21 208 lb (94.3 kg)  06/29/21 208 lb (94.3 kg)      Physical Exam Chest:      No axillary lymph node enlargement.      Assessment & Plan:  Marland KitchenMarland KitchenLollie was seen today for breast mass.  Diagnoses and all orders for this visit:  Mass overlapping multiple quadrants of right breast -     MM DIAG BREAST TOMO BILATERAL; Future -     US BREAST COMPLETE UNI RIGHT INC AXILLA  Nausea -     ondansetron (ZOFRAN ODT) 8 MG disintegrating  tablet; Take 1 tablet (8 mg total) by mouth every 8 (eight) hours as needed for nausea or vomiting.  Dyslipidemia (high LDL; low HDL) -     Evolocumab (REPATHA SURECLICK) 794 MG/ML SOAJ; Inject 140 mg into the skin every 14 (fourteen) days.   Unclear etiology of mass Ordered mammogram and ultrasound at breast clinic Follow up as needed Repatha and zofran sent to [facility]. BP improved on 2nd recheck   [name], PA-C

## 2021-11-25 NOTE — Telephone Encounter (Signed)
Patient also left a vm about this.

## 2021-11-29 NOTE — Telephone Encounter (Signed)
Per patient's request - orders have been updated. Preferred location is: [place] [phone].

## 2021-11-29 NOTE — Addendum Note (Signed)
Addended by: Mertha Finders on: 11/29/2021 04:29 PM   Modules accepted: Orders

## 2021-12-02 ENCOUNTER — Telehealth: Payer: Self-pay

## 2021-12-02 NOTE — Telephone Encounter (Addendum)
Initiated Prior authorization JYL:[id number] SureClick 122ZY/TM auto-injectors Via: Covermymeds Case/Key:[id number] Status: approved  as of  12/02/21 Reason: approved through 06/03/2022.  Notified Pt via: Mychart

## 2021-12-05 ENCOUNTER — Ambulatory Visit: Payer: PRIVATE HEALTH INSURANCE | Admitting: Pharmacist

## 2021-12-05 DIAGNOSIS — I10 Essential (primary) hypertension: Secondary | ICD-10-CM

## 2021-12-05 DIAGNOSIS — E118 Type 2 diabetes mellitus with unspecified complications: Secondary | ICD-10-CM

## 2021-12-05 DIAGNOSIS — E785 Hyperlipidemia, unspecified: Secondary | ICD-10-CM

## 2021-12-20 DIAGNOSIS — K5080 Crohn's disease of both small and large intestine without complications: Secondary | ICD-10-CM | POA: Insufficient documentation

## 2021-12-23 ENCOUNTER — Other Ambulatory Visit: Payer: Self-pay | Admitting: Neurology

## 2021-12-23 MED ORDER — LOSARTAN POTASSIUM 50 MG PO TABS: 50.0000 mg | ORAL_TABLET | Freq: Every day | ORAL | 1 refills | Status: DC

## 2021-12-29 ENCOUNTER — Telehealth: Payer: Self-pay | Admitting: Neurology

## 2021-12-29 NOTE — Telephone Encounter (Signed)
Patient left vm stating she has had issue with belly button. Odor coming from the area, she has been trying to clean and it just bleeds and stays moist. She is asking if there is anything she can do to help?

## 2021-12-30 ENCOUNTER — Encounter: Payer: Self-pay | Admitting: Physician Assistant

## 2021-12-30 DIAGNOSIS — N6001 Solitary cyst of right breast: Secondary | ICD-10-CM | POA: Insufficient documentation

## 2021-12-30 MED ORDER — MUPIROCIN 2 % EX OINT: TOPICAL_OINTMENT | CUTANEOUS | 0 refills | Status: DC

## 2021-12-30 NOTE — Telephone Encounter (Signed)
Patient made aware.

## 2022-01-16 ENCOUNTER — Ambulatory Visit: Payer: PRIVATE HEALTH INSURANCE | Admitting: Pharmacist

## 2022-01-16 DIAGNOSIS — I10 Essential (primary) hypertension: Secondary | ICD-10-CM

## 2022-01-16 DIAGNOSIS — E785 Hyperlipidemia, unspecified: Secondary | ICD-10-CM

## 2022-01-16 DIAGNOSIS — E118 Type 2 diabetes mellitus with unspecified complications: Secondary | ICD-10-CM

## 2022-01-16 NOTE — Patient Instructions (Signed)
Visit Information  Thank you for taking time to visit with me today. Please don't hesitate to contact me if I can be of assistance to you before our next scheduled telephone appointment.  Following are the goals we discussed today:   Patient Goals/Self-Care Activities Over the next 90 days, patient will:  take medications as prescribed, check BG weekly at minimum  Follow Up Plan: Telephone follow up appointment with care management team member scheduled for:  3 months  Please call the care guide team at [phone] if you need to cancel or reschedule your appointment.    Patient verbalizes understanding of instructions and care plan provided today and agrees to view in [place]. Active MyChart status and patient understanding of how to access instructions and care plan via MyChart confirmed with patient.     Stacie Cardenas

## 2022-01-16 NOTE — Progress Notes (Signed)
Care Management   Pharmacy Note  01/16/2022 Name: Stacie Cardenas MRN: [id number] DOB: 05/18/1981   Summary: addressed HTN, HLD, and primarily DM.  She remains on ozempic 46m weekly and jardiance 189mdaily.  She also began repatha - first injection last Monday, 01/09/22.   BG: not checking at home, but she states will get back into the habit.  Recommendations/Changes made from today's visit: - No medication changes, resume checking glucose once weekly - Recommend repeat urine microalbumin, a1c at next office visit  - Due for repeat lipid panel ~ 04/03/22 to observe outcome of new repatha   Plan: f/u with pharmacist in 3 months  Subjective: Stacie Cardenas a [age].o. year old female who is a primary care patient of BrDonella StadePA-C. The Care Management team was consulted for assistance with care management and care coordination needs.     Engaged with patient by telephone for follow up visit in response to provider referral for pharmacy case management and/or care coordination services.   The patient was given information about Care Management services today including:  Care Management services includes personalized support from designated clinical staff supervised by the patient's primary care provider, including individualized plan of care and coordination with other care providers. 24/7 contact phone numbers for assistance for urgent and routine care needs. The patient may stop case management services at any time by phone call to the office staff.  Patient agreed to services and consent obtained.  Assessment:  Review of patient status, including review of consultants reports, laboratory and other test data, was performed as part of comprehensive evaluation and provision of chronic care management services.   SDOH (Social Determinants of Health) assessments and interventions performed:    Objective:  Lab Results  Component Value Date   CREATININE 0.62 04/04/2021    CREATININE 0.91 12/27/2018   CREATININE 0.9 02/02/2017    Lab Results  Component Value Date   HGBA1C 5.7 (A) 10/04/2021       Component Value Date/Time   CHOL 262 (H) 10/05/2021 0000   TRIG 126 10/05/2021 0000   HDL 56 10/05/2021 0000   CHOLHDL 4.7 10/05/2021 0000   VLDL 23 05/16/2016 1005   LDLCALC 180 (H) 10/05/2021 0000     Clinical ASCVD:  The 10-year ASCVD risk score (Arnett DK, et al., 2019) is: 2.9%   Values used to calculate the score:     Age: [age]     Sex: Female     Is Non-Hispanic African American: Yes     Diabetic: Yes     Tobacco smoker: No     Systolic Blood Pressure: 11470mHg     Is BP treated: Yes     HDL Cholesterol: 56 mg/dL     Total Cholesterol: 262 mg/dL    BP Readings from Last 3 Encounters:  11/23/21 132/80  10/04/21 122/80  06/29/21 (!) 130/52    Care Plan  Allergies  Allergen Reactions   Metformin Other (See Comments)    Vaginal yeast infections   Ampicillin Diarrhea   Doxycycline     Nausea/vomiting   Rosuvastatin Other (See Comments)   Xigduo Xr [Dapagliflozin-Metformin Hcl Er]     Yeast infections.     Medications Reviewed Today     Reviewed by KlDarius BumpRPThe Gables Surgical CenterPharmacist) on 01/16/22 at 09Brandywineist Status: <None>   Medication Order Taking? Sig Documenting Provider Last Dose Status Informant  blood glucose meter kit and supplies  KIT [id number] Yes Dispense based on patient and insurance preference. Use up to four times daily as directed. Please include lancets, test strips, control solution. Stacie Stade, PA-C Taking Active   empagliflozin (JARDIANCE) 10 MG TABS tablet [id number] Yes Take 1 tablet (10 mg total) by mouth daily. Stacie Stade, PA-C Taking Active   Evolocumab (REPATHA SURECLICK) 062 MG/ML SOAJ [id number] Yes Inject 140 mg into the skin every 14 (fourteen) days. Stacie Stade, PA-C Taking Active   fluticasone (FLONASE) 50 MCG/ACT nasal spray [id number] Yes Place 2 sprays into both nostrils daily.  Stacie Stade, PA-C Taking Active   losartan (COZAAR) 50 MG tablet [id number] Yes Take 1 tablet (50 mg total) by mouth daily. Stacie Stade, PA-C Taking Active   mupirocin ointment (BACTROBAN) 2 % [id number] Yes Apply to affected area TID for 7 days. Stacie Stade, PA-C Taking Active   ondansetron (ZOFRAN ODT) 8 MG disintegrating tablet [id number] Yes Take 1 tablet (8 mg total) by mouth every 8 (eight) hours as needed for nausea or vomiting. Stacie Stade, PA-C Taking Active   Semaglutide, 1 MG/DOSE, 4 MG/3ML SOPN [id number] Yes Inject 1 mg as directed once a week. Stacie Stade, PA-C Taking Active   vedolizumab (ENTYVIO) 300 MG injection [id number] Yes Inject 300 mg into the vein every 30 (thirty) days. Stacie Stade, PA-C Taking Active            Med Note [name] Nov 09, 2021  9:48 AM) Managed by gastroenterology (via [place])            Patient Active Problem List   Diagnosis Date Noted   Benign breast cyst in female, right 12/30/2021   Immunity status testing 10/04/2021   Crohn's disease of small intestine without complication ([place]) 08/67/6195   Vaccine counseling 10/04/2021   Myalgia 06/29/2021   Conductive hearing loss 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 04/18/2021   Acute nonintractable headache 04/18/2021   Ear pressure, left 04/15/2021   Microalbuminuria 04/05/2021   Uncontrolled type 2 diabetes mellitus with hyperglycemia ([place]) 04/05/2021   Arthralgia 04/05/2021   Nausea 04/05/2021   Abscess 01/27/2020   COVID-19 virus infection 01/27/2020   Anemia 01/27/2020   Abscess of left elbow 07/03/2019   No energy 12/30/2018   Breast pain, right 12/30/2018   Long-term current use of high risk medication other than anticoagulant 07/05/2018   Dyslipidemia (high LDL; low HDL) 03/20/2018   Hidradenitis suppurativa 11/09/2017   Hyperpigmentation of skin, postinflammatory 11/09/2017   Acanthosis nigricans 11/09/2017    Chronic left-sided low back pain without sciatica 07/08/2017   Anxiety 07/08/2017   Chronic fatigue syndrome 02/18/2017   Morbidly obese ([place]) 10/20/2016   Vitamin D deficiency 10/03/2016   Absolute anemia 09/07/2015   Insomnia 06/20/2015   Type 2 diabetes mellitus, controlled ([place]) 06/08/2014   Crohn's disease ([place]) 03/30/2014   Crohn's disease of large intestine without complication ([place]) 09/32/6712   Menorrhagia 07/03/2013   MIGRAINE HEADACHE 06/28/2010   Dyslipidemia, goal LDL below 70 04/27/2010   Iron deficiency anemia 01/21/2010   WEIGHT GAIN 11/13/2008   HYPERTENSION, BENIGN ESSENTIAL 04/26/2006    Conditions to be addressed/monitored: HTN, HLD, and DMII  Care Plan : Medication Management  Updates made by [name], [place] since 01/16/2022 12:00 AM     Problem: DM, HTN, HLD      Long-Range Goal: Disease Progression Prevention   Start Date: 04/20/2021  Recent Progress: On track  Priority: High  Note:   Current Barriers:  Unable to achieve control of HLD   Pharmacist Clinical Goal(s):  Over the next 60 days, patient will adhere to plan to optimize therapeutic regimen for diabetes and hyperlipidemia as evidenced by report of adherence to recommended medication management changes through collaboration with PharmD and provider.   Interventions: 1:1 collaboration with Stacie Stade, PA-C regarding development and update of comprehensive plan of care as evidenced by provider attestation and co-signature Inter-disciplinary care team collaboration (see longitudinal plan of care) Comprehensive medication review performed; medication list updated in electronic medical record  Diabetes:   Uncontrolled; current treatment:jardiance 90m daily, ozempic 144mweekly, jardiance 1088maily,a1c 5.7 (down from 11.3)  Current glucose readings: no longer checking  Denies hyperglycemic symptoms  Current meal patterns: breakfast: bacon, eggs, toast, oatmeal + fruit; lunch: grilled BBQ  chicken & corn; dinner: air fryer or grill food, salmon, peas, broccoli; snacks: jello w/fruit, nuts (careful w/ crohns), chips, cheezits, "bad w/ sweets"; drinks: dr pepper ~1 per day but not daily, water,   Current exercise: walks  Counseled on mechanism of action of medications, importance of proper glycemic control and downstream effects Recommended continue current medications and check BG fasting at minimum once weekly if no longer taking jardiance  Hypertension:  Controlled; current treatment:losartan 19m49mily;   Current home readings: SBP 120-130s  Denies hypotensive/hypertensive symptoms  Recommended continue current regimen Hyperlipidemia:  Uncontrolled; current treatment: repatha  Medications previously tried: simvastatin, rosuvastatin  Recommended continue repatha, due for repeat cholesterol 04/03/22  Patient Goals/Self-Care Activities Over the next 90 days, patient will:  take medications as prescribed, check BG weekly at minimum  Follow Up Plan: Telephone follow up appointment with care management team member scheduled for:  3 months       Medication Assistance:  None required.  Patient affirms current coverage meets needs.  Follow Up:  Patient agrees to Care Plan and Follow-up.  Plan: Telephone follow up appointment with care management team member scheduled for:  3 month  KeesLarinda ButteryarmD Clinical Pharmacist [facility]-[phone]

## 2022-01-20 ENCOUNTER — Other Ambulatory Visit: Payer: Self-pay | Admitting: Physician Assistant

## 2022-01-20 DIAGNOSIS — E118 Type 2 diabetes mellitus with unspecified complications: Secondary | ICD-10-CM

## 2022-01-20 MED ORDER — SEMAGLUTIDE (1 MG/DOSE) 4 MG/3ML ~~LOC~~ SOPN: 1.0000 mg | PEN_INJECTOR | SUBCUTANEOUS | 0 refills | Status: DC

## 2022-04-17 ENCOUNTER — Telehealth: Payer: PRIVATE HEALTH INSURANCE

## 2022-04-17 ENCOUNTER — Ambulatory Visit (INDEPENDENT_AMBULATORY_CARE_PROVIDER_SITE_OTHER): Payer: PRIVATE HEALTH INSURANCE | Admitting: Physician Assistant

## 2022-04-17 ENCOUNTER — Encounter: Payer: Self-pay | Admitting: Physician Assistant

## 2022-04-17 VITALS — BP 120/86 | HR 92 | Ht 64.0 in | Wt 208.0 lb

## 2022-04-17 DIAGNOSIS — I10 Essential (primary) hypertension: Secondary | ICD-10-CM | POA: Diagnosis not present

## 2022-04-17 DIAGNOSIS — Z0000 Encounter for general adult medical examination without abnormal findings: Secondary | ICD-10-CM

## 2022-04-17 DIAGNOSIS — E559 Vitamin D deficiency, unspecified: Secondary | ICD-10-CM

## 2022-04-17 DIAGNOSIS — N898 Other specified noninflammatory disorders of vagina: Secondary | ICD-10-CM | POA: Diagnosis not present

## 2022-04-17 DIAGNOSIS — E118 Type 2 diabetes mellitus with unspecified complications: Secondary | ICD-10-CM

## 2022-04-17 DIAGNOSIS — Z1329 Encounter for screening for other suspected endocrine disorder: Secondary | ICD-10-CM | POA: Diagnosis not present

## 2022-04-17 DIAGNOSIS — F418 Other specified anxiety disorders: Secondary | ICD-10-CM

## 2022-04-17 LAB — WET PREP FOR TRICH, YEAST, CLUE
MICRO NUMBER:: 14148518
Specimen Quality: ADEQUATE

## 2022-04-17 LAB — POCT UA - MICROALBUMIN
Albumin/Creatinine Ratio, Urine, POC: <30
Creatinine, POC: 200 mg/dL
Microalbumin Ur, POC: 30 mg/L

## 2022-04-17 LAB — POCT URINALYSIS DIP (CLINITEK)
Bilirubin, UA: NEGATIVE
Blood, UA: NEGATIVE
Glucose, UA: 500 mg/dL — AB
Ketones, POC UA: NEGATIVE mg/dL
Leukocytes, UA: NEGATIVE
Nitrite, UA: NEGATIVE
POC PROTEIN,UA: NEGATIVE
Spec Grav, UA: 1.025 (ref 1.010–1.025)
Urobilinogen, UA: 0.2 E.U./dL
pH, UA: 6.5 (ref 5.0–8.0)

## 2022-04-17 MED ORDER — PROPRANOLOL HCL 10 MG PO TABS: ORAL_TABLET | ORAL | 1 refills | Status: DC

## 2022-04-17 MED ORDER — FLUCONAZOLE 150 MG PO TABS: 150.0000 mg | ORAL_TABLET | Freq: Every day | ORAL | 0 refills | Status: DC

## 2022-04-17 NOTE — Progress Notes (Signed)
Yeast found. Diflucan will treat!

## 2022-04-17 NOTE — Patient Instructions (Signed)

## 2022-04-17 NOTE — Progress Notes (Signed)
Complete physical exam  Patient: Stacie Cardenas   DOB: Jun 29, 1980   41 y.o. Female  MRN: [id number]  Subjective:    Chief Complaint  Patient presents with   Annual Exam    Stacie Cardenas is a 41 y.o. female who presents today for a complete physical exam. She reports consuming a general diet. The patient does not participate in regular exercise at present. She generally feels well. She reports sleeping well. She does have additional problems to discuss today.   She feels like she could be getting yeast infection from recent antibiotic or jardiance. She is having some vaginal irriation and increase in discharge.   Her anxiety with school work and testing is increasing. She feels like she needs something as needed to calm down before test.    Most recent fall risk assessment:    04/17/2022   10:09 AM  Fall Risk   Falls in the past year? 0  Number falls in past yr: 0  Injury with Fall? 0  Risk for fall due to : No Fall Risks  Follow up Falls evaluation completed     Most recent depression screenings:    04/17/2022   10:06 AM 10/04/2021    3:07 PM  PHQ 2/9 Scores  PHQ - 2 Score 0 0  PHQ- 9 Score 1     Vision:Within last year and Dental: No current dental problems and Receives regular dental care  Patient Active Problem List   Diagnosis Date Noted   Performance anxiety 04/17/2022   Benign breast cyst in female, right 12/30/2021   Immunity status testing 10/04/2021   Crohn's disease of small intestine without complication ([place]) 88/32/5498   Vaccine counseling 10/04/2021   Myalgia 06/29/2021   Conductive hearing loss 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 04/18/2021   Acute nonintractable headache 04/18/2021   Ear pressure, left 04/15/2021   Microalbuminuria 04/05/2021   Uncontrolled type 2 diabetes mellitus with hyperglycemia ([place]) 04/05/2021   Arthralgia 04/05/2021   Nausea 04/05/2021   Abscess 01/27/2020   COVID-19 virus infection 01/27/2020   Anemia  01/27/2020   Abscess of left elbow 07/03/2019   No energy 12/30/2018   Breast pain, right 12/30/2018   Long-term current use of high risk medication other than anticoagulant 07/05/2018   Dyslipidemia (high LDL; low HDL) 03/20/2018   Hidradenitis suppurativa 11/09/2017   Hyperpigmentation of skin, postinflammatory 11/09/2017   Acanthosis nigricans 11/09/2017   Chronic left-sided low back pain without sciatica 07/08/2017   Anxiety 07/08/2017   Chronic fatigue syndrome 02/18/2017   Morbidly obese ([place]) 10/20/2016   Vitamin D deficiency 10/03/2016   Absolute anemia 09/07/2015   Insomnia 06/20/2015   Type 2 diabetes mellitus, controlled ([place]) 06/08/2014   Crohn's disease ([place]) 03/30/2014   Crohn's disease of large intestine without complication ([place]) 26/41/5830   Menorrhagia 07/03/2013   MIGRAINE HEADACHE 06/28/2010   Dyslipidemia, goal LDL below 70 04/27/2010   Iron deficiency anemia 01/21/2010   WEIGHT GAIN 11/13/2008   HYPERTENSION, BENIGN ESSENTIAL 04/26/2006   Past Medical History:  Diagnosis Date   Crohn's disease ([place]) 03/30/2014   Biopsies done 03/23/14.  Digestive health management.     Hypercholesterolemia    Hypertension    IBS (irritable bowel syndrome)    Other specified iron deficiency anemias 03/17/2013   Type 2 diabetes mellitus, controlled ([place]) 06/08/2014   Past Surgical History:  Procedure Laterality Date   CESAREAN SECTION     No family history on file. Allergies  Allergen Reactions   Metformin Other (See Comments)    Vaginal yeast infections   Ampicillin Diarrhea   Doxycycline     Nausea/vomiting   Rosuvastatin Other (See Comments)   Xigduo Xr [Dapagliflozin Pro-Metformin Er]     Yeast infections.       Patient Care Team: ,  L, PA-C as PCP - General (Family Medicine) Kline, Keesha J, RPH (Pharmacist)   Outpatient Medications Prior to Visit  Medication Sig   blood glucose meter kit and supplies KIT Dispense based on patient and  insurance preference. Use up to four times daily as directed. Please include lancets, test strips, control solution.   empagliflozin (JARDIANCE) 10 MG TABS tablet Take 1 tablet (10 mg total) by mouth daily.   Evolocumab (REPATHA SURECLICK) 140 MG/ML SOAJ Inject 140 mg into the skin every 14 (fourteen) days.   fluticasone (FLONASE) 50 MCG/ACT nasal spray Place 2 sprays into both nostrils daily.   losartan (COZAAR) 50 MG tablet Take 1 tablet (50 mg total) by mouth daily.   mupirocin ointment (BACTROBAN) 2 % Apply to affected area TID for 7 days.   ondansetron (ZOFRAN ODT) 8 MG disintegrating tablet Take 1 tablet (8 mg total) by mouth every 8 (eight) hours as needed for nausea or vomiting.   Semaglutide, 1 MG/DOSE, 4 MG/3ML SOPN Inject 1 mg as directed once a week.   vedolizumab (ENTYVIO) 300 MG injection Inject 300 mg into the vein every 30 (thirty) days.   No facility-administered medications prior to visit.    ROS   See HPI.      Objective:     BP 120/86   Pulse 92   Ht 5' 4" (1.626 m)   Wt 208 lb (94.3 kg)   SpO2 97%   BMI 35.70 kg/m  BP Readings from Last 3 Encounters:  04/17/22 120/86  11/23/21 132/80  10/04/21 122/80    Physical Exam  BP 120/86   Pulse 92   Ht 5' 4" (1.626 m)   Wt 208 lb (94.3 kg)   SpO2 97%   BMI 35.70 kg/m   General Appearance:    Alert, cooperative, no distress, appears stated age  Head:    Normocephalic, without obvious abnormality, atraumatic  Eyes:    PERRL, conjunctiva/corneas clear, EOM's intact, fundi    benign, both eyes  Ears:    Normal TM's and external ear canals, both ears  Nose:   Nares normal, septum midline, mucosa normal, no drainage    or sinus tenderness  Throat:   Lips, mucosa, and tongue normal; teeth and gums normal  Neck:   Supple, symmetrical, trachea midline, no adenopathy;    thyroid:  no enlargement/tenderness/nodules; no carotid   bruit or JVD  Back:     Symmetric, no curvature, ROM normal, no CVA tenderness   Lungs:     Clear to auscultation bilaterally, respirations unlabored  Chest Wall:    No tenderness or deformity   Heart:    Regular rate and rhythm, S1 and S2 normal, no murmur, rub   or gallop     Abdomen:     Soft, non-tender, bowel sounds active all four quadrants,    no masses, no organomegaly        Extremities:   Extremities normal, atraumatic, no cyanosis or edema  Pulses:   2+ and symmetric all extremities  Skin:   Skin color, texture, turgor normal, no rashes or lesions  Lymph nodes:   Cervical, supraclavicular, and axillary nodes normal    Neurologic:   CNII-XII intact, normal strength, sensation and reflexes    throughout      Assessment & Plan:    Routine Health Maintenance and Physical Exam  Immunization History  Administered Date(s) Administered   Hepatitis A, Ped/Adol-2 Dose 02/12/2020   Hpv-Unspecified 04/26/2006   Influenza Inj Mdck Quad Pf 04/20/2014   Influenza,inj,Quad PF,6+ Mos 04/20/2014, 02/03/2015, 03/20/2018   Influenza-Unspecified 04/08/1981, 03/03/2020, 03/14/2021   Pneumococcal Conjugate-13 02/27/2019   Pneumococcal Polysaccharide-23 08/14/2014, 02/12/2020   Tdap 03/14/2012, 09/13/2021    Health Maintenance  Topic Date Due   HPV VACCINES (2 - Risk 3-dose series) 05/24/2006   Diabetic kidney evaluation - GFR measurement  04/04/2022   FOOT EXAM  04/04/2022   HEMOGLOBIN A1C  04/05/2022   COVID-19 Vaccine (1) 05/03/2022 (Originally 01/06/1986)   MAMMOGRAM  04/20/2022   OPHTHALMOLOGY EXAM  06/07/2022   Diabetic kidney evaluation - Urine ACR  04/18/2023   PAP SMEAR-Modifier  12/27/2023   TETANUS/TDAP  09/14/2031   INFLUENZA VACCINE  Completed   Hepatitis C Screening  Completed   HIV Screening  Completed    Discussed health benefits of physical activity, and encouraged her to engage in regular exercise appropriate for her age and condition.  ..Leather was seen today for annual exam.  Diagnoses and all orders for this visit:  Routine adult  health maintenance -     Lipid Panel w/reflex Direct LDL -     Hemoglobin A1c -     TSH  Controlled type 2 diabetes mellitus with complication, without long-term current use of insulin (HCC) -     Hemoglobin A1c -     POCT UA - Microalbumin  HYPERTENSION, BENIGN ESSENTIAL  Thyroid disorder screen -     TSH  Vitamin D deficiency -     VITAMIN D 25 Hydroxy (Vit-D Deficiency, Fractures)  Performance anxiety -     propranolol (INDERAL) 10 MG tablet; Take one to four tablets 30 minutes before test.  Vaginal discharge -     fluconazole (DIFLUCAN) 150 MG tablet; Take 1 tablet (150 mg total) by mouth daily. -     WET PREP FOR TRICH, YEAST, CLUE -     POCT URINALYSIS DIP (CLINITEK)   .. Discussed 150 minutes of exercise a week.  Encouraged vitamin D 1000 units and Calcium 1300mg or 4 servings of dairy a day.  Will get fasting labs PHQ/GAD no concerns Will start propranolol for test taking anxiety Discussed how to use as needed Vitals look great Normal microalbumin Pap UTD Needs mammogram. Reach out to [facility].  UTD flu shot Decline covid vaccine  Wet prep ordered for discharge/itching.  Start diflucan Follow up as needed  Will adjust diabetic medications accordingly       , PA-C   

## 2022-04-18 ENCOUNTER — Other Ambulatory Visit: Payer: Self-pay | Admitting: Physician Assistant

## 2022-04-18 DIAGNOSIS — E118 Type 2 diabetes mellitus with unspecified complications: Secondary | ICD-10-CM

## 2022-04-18 LAB — VITAMIN D 25 HYDROXY (VIT D DEFICIENCY, FRACTURES): Vit D, 25-Hydroxy: 21 ng/mL — ABNORMAL LOW (ref 30–100)

## 2022-04-18 LAB — HEMOGLOBIN A1C
Hgb A1c MFr Bld: 6.2 % of total Hgb — ABNORMAL HIGH (ref <5.7)
Mean Plasma Glucose: 131 mg/dL
eAG (mmol/L): 7.3 mmol/L

## 2022-04-18 LAB — LIPID PANEL W/REFLEX DIRECT LDL
Cholesterol: 154 mg/dL (ref <200)
HDL: 62 mg/dL (ref >=50)
LDL Cholesterol (Calc): 72 mg/dL (calc)
Non-HDL Cholesterol (Calc): 92 mg/dL (calc) (ref <130)
Total CHOL/HDL Ratio: 2.5 (calc) (ref <5.0)
Triglycerides: 118 mg/dL (ref <150)

## 2022-04-18 LAB — TSH: TSH: 1.13 mIU/L

## 2022-04-18 MED ORDER — VITAMIN D (ERGOCALCIFEROL) 1.25 MG (50000 UNIT) PO CAPS: 50000.0000 [IU] | ORAL_CAPSULE | ORAL | 3 refills | Status: DC

## 2022-04-18 MED ORDER — EMPAGLIFLOZIN 10 MG PO TABS: 10.0000 mg | ORAL_TABLET | Freq: Every day | ORAL | 0 refills | Status: DC

## 2022-04-18 MED ORDER — SEMAGLUTIDE (1 MG/DOSE) 4 MG/3ML ~~LOC~~ SOPN: 1.0000 mg | PEN_INJECTOR | SUBCUTANEOUS | 0 refills | Status: DC

## 2022-04-18 NOTE — Progress Notes (Signed)
Stacie Cardenas,   Your A1C went up to 6.2. watch sugars and carbs in diet. Sent refills.  Vitamin D better but still low. I am going to send over high dose weekly to take with dairy.  Cholesterol looks GREAT!!! Continue repatha Thyroid looks wonderful.

## 2022-04-24 ENCOUNTER — Ambulatory Visit (INDEPENDENT_AMBULATORY_CARE_PROVIDER_SITE_OTHER): Payer: PRIVATE HEALTH INSURANCE | Admitting: Pharmacist

## 2022-04-24 DIAGNOSIS — E785 Hyperlipidemia, unspecified: Secondary | ICD-10-CM

## 2022-04-24 DIAGNOSIS — I10 Essential (primary) hypertension: Secondary | ICD-10-CM

## 2022-04-24 DIAGNOSIS — E118 Type 2 diabetes mellitus with unspecified complications: Secondary | ICD-10-CM

## 2022-04-24 NOTE — Progress Notes (Unsigned)
Care Management   Pharmacy Note  04/24/2022 Name: Stacie Cardenas MRN: [id number] DOB: 1981/05/08   Summary: addressed HTN, HLD, and primarily DM.  She remains on ozempic 34m weekly and jardiance 131mdaily.  She also began repatha - first injection last Monday, 01/09/22.   BG: 84, 95, 99, 115  Recommendations/Changes made from today's visit: - No medication changes, resume checking glucose once weekly - Recommend repeat urine microalbumin, a1c at next o ffice visit  - Due for repeat lipid panel ~ 04/03/22 to observe outcome of new repatha   Plan: f/u with pharmacist in 3 months  Subjective: RoSENYA HINZMANs a [age].o. year old female who is a primary care patient of BrDonella StadePA-C. The Care Management team was consulted for assistance with care management and care coordination needs.     Engaged with patient by telephone for follow up visit in response to provider referral for pharmacy case management and/or care coordination services.   The patient was given information about Care Management services today including:  Care Management services includes personalized support from designated clinical staff supervised by the patient's primary care provider, including individualized plan of care and coordination with other care providers. 24/7 contact phone numbers for assistance for urgent and routine care needs. The patient may stop case management services at any time by phone call to the office staff.  Patient agreed to services and consent obtained.  Assessment:  Review of patient status, including review of consultants reports, laboratory and other test data, was performed as part of comprehensive evaluation and provision of chronic care management services.   SDOH (Social Determinants of Health) assessments and interventions performed:  SDOH Interventions    Flowsheet Row [facility]  Interventions   Depression Interventions/Treatment  Counseling        Objective:  Lab Results  Component Value Date   CREATININE 0.62 04/04/2021   CREATININE 0.91 12/27/2018   CREATININE 0.9 02/02/2017    Lab Results  Component Value Date   HGBA1C 6.2 (H) 04/17/2022       Component Value Date/Time   CHOL 154 04/17/2022 0000   TRIG 118 04/17/2022 0000   HDL 62 04/17/2022 0000   CHOLHDL 2.5 04/17/2022 0000   VLDL 23 05/16/2016 1005   LDLCALC 72 04/17/2022 0000     Clinical ASCVD:  The 10-year ASCVD risk score (Arnett DK, et al., 2019) is: 1.6%   Values used to calculate the score:     Age: [age]     Sex: Female     Is Non-Hispanic African American: Yes     Diabetic: Yes     Tobacco smoker: No     Systolic Blood Pressure: 12620mHg     Is BP treated: Yes     HDL Cholesterol: 62 mg/dL     Total Cholesterol: 154 mg/dL    BP Readings from Last 3 Encounters:  04/17/22 120/86  11/23/21 132/80  10/04/21 122/80    Care Plan  Allergies  Allergen Reactions   Metformin Other (See Comments)    Vaginal yeast infections   Ampicillin Diarrhea   Doxycycline     Nausea/vomiting   Rosuvastatin Other (See Comments)   Xigduo Xr [Dapagliflozin Pro-Metformin Er]     Yeast infections.     Medications Reviewed Today     Reviewed by TuOk EdwardsCMA (Certified Medical Assistant) on 04/17/22 at [phone]  Med List Status: <None>   Medication Order Taking? Sig Documenting Provider Last Dose Status Informant  blood glucose meter kit and supplies KIT [id number] Yes Dispense based on patient and insurance preference. Use up to four times daily as directed. Please include lancets, test strips, control solution. [name], PA-C Taking Active   empagliflozin (JARDIANCE) 10 MG TABS tablet [id number] Yes Take 1 tablet (10 mg total) by mouth daily. [name], PA-C Taking Active   Evolocumab (REPATHA SURECLICK) 275 MG/ML SOAJ [id number] Yes Inject 140 mg into the skin  every 14 (fourteen) days. [name], PA-C Taking Active   fluticasone (FLONASE) 50 MCG/ACT nasal spray [id number] Yes Place 2 sprays into both nostrils daily. [name], PA-C Taking Active   losartan (COZAAR) 50 MG tablet [id number] Yes Take 1 tablet (50 mg total) by mouth daily. [name], PA-C Taking Active   mupirocin ointment (BACTROBAN) 2 % [id number] Yes Apply to affected area TID for 7 days. [name], PA-C Taking Active   ondansetron (ZOFRAN ODT) 8 MG disintegrating tablet [id number] Yes Take 1 tablet (8 mg total) by mouth every 8 (eight) hours as needed for nausea or vomiting. [name], PA-C Taking Active   Semaglutide, 1 MG/DOSE, 4 MG/3ML SOPN [id number] Yes Inject 1 mg as directed once a week. [name], PA-C Taking Active   vedolizumab (ENTYVIO) 300 MG injection [id number] Yes Inject 300 mg into the vein every 30 (thirty) days. [name], PA-C Taking Active            Med Note Stacie Cardenas Nov 09, 2021  9:48 AM) Managed by gastroenterology (via [place])            Patient Active Problem List   Diagnosis Date Noted   Performance anxiety 04/17/2022   Benign breast cyst in female, right 12/30/2021   Immunity status testing 10/04/2021   Crohn's disease of small intestine without complication ([place]) 37/34/2876   Vaccine counseling 10/04/2021   Myalgia 06/29/2021   Conductive hearing loss 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 04/18/2021   Acute nonintractable headache 04/18/2021   Ear pressure, left 04/15/2021   Microalbuminuria 04/05/2021   Uncontrolled type 2 diabetes mellitus with hyperglycemia ([name]) 04/05/2021   Arthralgia 04/05/2021   Nausea 04/05/2021   Abscess 01/27/2020   COVID-19 virus infection 01/27/2020   Anemia 01/27/2020   Abscess of left elbow 07/03/2019   No energy 12/30/2018   Breast pain, right 12/30/2018   Long-term current use of high risk medication other than  anticoagulant 07/05/2018   Dyslipidemia (high LDL; low HDL) 03/20/2018   Hidradenitis suppurativa 11/09/2017   Hyperpigmentation of skin, postinflammatory 11/09/2017   Acanthosis nigricans 11/09/2017   Chronic left-sided low back pain without sciatica 07/08/2017   Anxiety 07/08/2017   Chronic fatigue syndrome 02/18/2017   Morbidly obese ([place]) 10/20/2016   Vitamin D deficiency 10/03/2016   Absolute anemia 09/07/2015   Insomnia 06/20/2015   Type 2 diabetes mellitus, controlled ([place]) 06/08/2014   Crohn's disease ([place]) 03/30/2014   Crohn's disease of large intestine without complication ([place]) 81/15/7262   Menorrhagia 07/03/2013   MIGRAINE HEADACHE 06/28/2010   Dyslipidemia, goal LDL below 70 04/27/2010   Iron deficiency anemia 01/21/2010   WEIGHT GAIN 11/13/2008   HYPERTENSION, BENIGN ESSENTIAL 04/26/2006    Conditions to be addressed/monitored: HTN, HLD, and DMII  There are no care plans that you recently modified to display for this  patient.      Medication Assistance:  None required.  Patient affirms current coverage meets needs.  Follow Up:  Patient agrees to Care Plan and Follow-up.  Plan: Telephone follow up appointment with care management team member scheduled for:  3 month  Larinda Buttery, PharmD Clinical Pharmacist [facility] [phone]

## 2022-04-28 ENCOUNTER — Encounter: Payer: Self-pay | Admitting: Family

## 2022-05-03 MED ORDER — MOUNJARO 5 MG/0.5ML ~~LOC~~ SOAJ: 5.0000 mg | SUBCUTANEOUS | 2 refills | Status: DC

## 2022-05-03 NOTE — Addendum Note (Signed)
Addended by: [name] on: 05/03/2022 10:46 AM   Modules accepted: Orders

## 2022-05-05 LAB — HM MAMMOGRAPHY

## 2022-06-07 LAB — HM DIABETES EYE EXAM

## 2022-06-09 ENCOUNTER — Telehealth: Payer: Self-pay

## 2022-06-09 NOTE — Telephone Encounter (Signed)
Initiated Prior authorization PDF:GILUYYY [employer] 159XQ/HS auto-injectors  Via: Covermymeds Case/Key:[id number] Status: Pending as of 06/09/22 Reason: Notified Pt via: Mychart

## 2022-07-06 ENCOUNTER — Encounter: Payer: Self-pay | Admitting: Physician Assistant

## 2022-07-19 ENCOUNTER — Telehealth (INDEPENDENT_AMBULATORY_CARE_PROVIDER_SITE_OTHER): Payer: PRIVATE HEALTH INSURANCE | Admitting: Physician Assistant

## 2022-07-19 DIAGNOSIS — L089 Local infection of the skin and subcutaneous tissue, unspecified: Secondary | ICD-10-CM | POA: Diagnosis not present

## 2022-07-19 DIAGNOSIS — H01001 Unspecified blepharitis right upper eyelid: Secondary | ICD-10-CM

## 2022-07-19 MED ORDER — AZITHROMYCIN 250 MG PO TABS: ORAL_TABLET | ORAL | 0 refills | Status: DC

## 2022-07-19 MED ORDER — FLUCONAZOLE 150 MG PO TABS: 150.0000 mg | ORAL_TABLET | Freq: Once | ORAL | 0 refills | Status: AC

## 2022-07-19 MED ORDER — MUPIROCIN 2 % EX OINT: TOPICAL_OINTMENT | CUTANEOUS | 0 refills | Status: DC

## 2022-07-19 MED ORDER — PREDNISONE 50 MG PO TABS: ORAL_TABLET | ORAL | 0 refills | Status: DC

## 2022-07-19 NOTE — Patient Instructions (Signed)
Blepharitis Blepharitis refers to inflammation of the eyelids. It is a common condition and can cause dryness or grittiness in the eyes. Other symptoms may include: Reddish, scaly skin around the scalp and eyebrows. Burning or itching of the eyelids. Eye discharge at night that causes the eyelashes to stick together in the morning. Eyelashes that fall out. Redness of the eyes. Sensitivity to light. Follow these instructions at home: Pay attention to any changes in how your eyes look or feel. Tell your health care provider about any changes. Follow these instructions to help with your condition. Keeping clean Wash your hands often with soap and water for at least 20 seconds. Clean your eyes and wash the edges of your eyelids with diluted baby shampoo or commercial eyelid wipes. Do this 2 or more times a day. Wash your face and eyebrows at least once a day. Use a clean towel each time you dry your eyelids. Do not use this towel to clean or dry other areas of your body. Do not share your towel with anyone. General instructions Avoid wearing makeup until you get better. Do not share makeup with anyone. Avoid rubbing your eyes. Use warm compresses on the eyes for 5-10 minutes. Do this 1 or 2 times a day, or as told by your health care provider. You can use warm water on a towel, but a microwaveable heating pad often stays warm longer. The pad should be very warm but not hot enough to burn the skin. If you were prescribed an antibiotic ointment or steroid drops, apply or use the medicine as told by your health care provider. Do not stop using the medicine even if you feel better. Keep all follow-up visits. This is important. Contact a health care provider if: Your eyelids feel hot. You have blisters or a rash on your eyelids. The inflammation gets worse or does not go away in 2-4 days. Get help right away if: You have pain or redness that gets worse or spreads to other parts of your face. Your  vision changes. You have pain when looking at lights or moving objects. You have a fever. Summary Blepharitis refers to inflammation of the eyelids. It can cause dryness and grittiness in the eyes. Pay attention to any changes in how your eyes look or feel. Tell your health care provider about any changes. Follow home care instructions as told by your health care provider. Wash your hands often with soap and water for at least 20 seconds. Avoid wearing makeup. Do not rub your eyes. If you were prescribed an antibiotic ointment or steroid drops, apply or use the medicine as told by your health care provider. Get help right away if you have a fever, vision changes, pain or redness that gets worse or spreads to other parts of your face, or pain when looking at lights or moving objects. This information is not intended to replace advice given to you by your health care provider. Make sure you discuss any questions you have with your health care provider. Document Revised: 06/30/2020 Document Reviewed: 06/30/2020 Elsevier Patient Education  [place].

## 2022-07-19 NOTE — Progress Notes (Signed)
..  Virtual Visit via Video Note  I connected with [name] on 07/21/22 at 11:30 AM EST by a video enabled telemedicine application and verified that I am speaking with the correct person using two identifiers.  Location: Patient: home Provider: clinic  .Marland KitchenParticipating in visit:  Patient: Stacie Cardenas Provider: [name] PA-C   I discussed the limitations of evaluation and management by telemedicine and the availability of in person appointments. The patient expressed understanding and agreed to proceed.  History of Present Illness: Pt is a 42 yo obese immunosuppressed female who calls into the clinic with right upper eyelid swelling and pain. She has noticed since Saturday but getting worse. No fever, chills, body aches. No vision changes. No eye discharge. Never had anything like this before. She is using warm compresses without much improvement.     Observations/Objective: No acute distress Right upper eyelid red and swollen and tender to touch to eyebrow, no active eye discharge Conjunctiva, white and clear.    Assessment and Plan: Marland KitchenMarland KitchenDiagnoses and all orders for this visit:  Blepharitis of right upper eyelid, unspecified type -     predniSONE (DELTASONE) 50 MG tablet; One tab PO daily for 5 days. -     azithromycin (ZITHROMAX Z-PAK) 250 MG tablet; Take 2 tablets (500 mg) on  Day 1,  followed by 1 tablet (250 mg) once daily on Days 2 through 5.  Skin infection -     mupirocin ointment (BACTROBAN) 2 %; Apply to affected area TID for 7 days.  Other orders -     fluconazole (DIFLUCAN) 150 MG tablet; Take 1 tablet (150 mg total) by mouth once for 1 dose.   No conjunctivitis noted Continue warm compresses and light eye scrub Start zpak and prednisone for the inflammation Diflucan for use after abx to prevent yeast infection Follow up as needed or if symptoms worsen Refilled bactroban for superficial skin infections but not for eye    Follow Up Instructions:    I  discussed the assessment and treatment plan with the patient. The patient was provided an opportunity to ask questions and all were answered. The patient agreed with the plan and demonstrated an understanding of the instructions.   The patient was advised to call back or seek an in-person evaluation if the symptoms worsen or if the condition fails to improve as anticipated.    [name], PA-C

## 2022-07-21 ENCOUNTER — Encounter: Payer: Self-pay | Admitting: Physician Assistant

## 2022-09-12 ENCOUNTER — Encounter: Payer: Self-pay | Admitting: Physician Assistant

## 2022-09-12 ENCOUNTER — Telehealth: Payer: Self-pay | Admitting: Physician Assistant

## 2022-09-12 NOTE — Telephone Encounter (Signed)
-----   Message from [name], [place] sent at 12/30/2021  4:59 PM EDT ----- Diagnostic mamm and u/s nov 2023

## 2022-09-12 NOTE — Telephone Encounter (Signed)
She did have a diagnostic mammogram last November and is schedule for another diagnostic mammogram in May of this year. She goes to [facility].

## 2022-09-25 ENCOUNTER — Encounter: Payer: Self-pay | Admitting: Family Medicine

## 2022-09-25 ENCOUNTER — Ambulatory Visit (INDEPENDENT_AMBULATORY_CARE_PROVIDER_SITE_OTHER): Payer: PRIVATE HEALTH INSURANCE | Admitting: Family Medicine

## 2022-09-25 VITALS — BP 122/88 | HR 80 | Temp 97.9°F | Ht 64.0 in | Wt 216.0 lb

## 2022-09-25 DIAGNOSIS — R350 Frequency of micturition: Secondary | ICD-10-CM | POA: Diagnosis not present

## 2022-09-25 DIAGNOSIS — N3000 Acute cystitis without hematuria: Secondary | ICD-10-CM | POA: Diagnosis not present

## 2022-09-25 LAB — POCT URINALYSIS DIP (CLINITEK)
Bilirubin, UA: NEGATIVE
Glucose, UA: 500 mg/dL — AB
Ketones, POC UA: NEGATIVE mg/dL
Nitrite, UA: POSITIVE — AB
Spec Grav, UA: 1.025 (ref 1.010–1.025)
Urobilinogen, UA: 1 E.U./dL
pH, UA: 6 (ref 5.0–8.0)

## 2022-09-25 MED ORDER — CEPHALEXIN 500 MG PO CAPS: 500.0000 mg | ORAL_CAPSULE | Freq: Two times a day (BID) | ORAL | 0 refills | Status: AC

## 2022-09-25 MED ORDER — FLUCONAZOLE 150 MG PO TABS: 150.0000 mg | ORAL_TABLET | Freq: Once | ORAL | 0 refills | Status: AC

## 2022-09-25 NOTE — Progress Notes (Signed)
Acute Office Visit  Subjective:     Patient ID: Stacie Cardenas, female    DOB: 11/17/1980, 42 y.o.   MRN: [id number]  Chief Complaint  Patient presents with   Urinary Frequency    Started this morning. She denies back pain, bleeding with urination, or painful urination. She believes it may be stress related she is trying to finish nursing school. She has taken AZO's to begin progress of treatment.     HPI Patient is in today for dysuria. She denies back pain, fevers. She took azo for pelvic pressure.  Review of Systems  Constitutional:  Negative for chills and fever.  Respiratory:  Negative for cough and shortness of breath.   Cardiovascular:  Negative for chest pain.  Neurological:  Negative for headaches.        Objective:    BP 122/88   Pulse 80   Temp 97.9 F (36.6 C) (Oral)   Ht 5\' 4"  (1.626 m)   Wt 216 lb (98 kg)   SpO2 100%   BMI 37.08 kg/m    Physical Exam Vitals and nursing note reviewed.  Constitutional:      General: She is not in acute distress.    Appearance: Normal appearance.  HENT:     Head: Normocephalic and atraumatic.     Right Ear: External ear normal.     Left Ear: External ear normal.     Nose: Nose normal.  Eyes:     Conjunctiva/sclera: Conjunctivae normal.  Cardiovascular:     Rate and Rhythm: Normal rate and regular rhythm.  Pulmonary:     Effort: Pulmonary effort is normal.     Breath sounds: Normal breath sounds.  Neurological:     General: No focal deficit present.     Mental Status: She is alert and oriented to person, place, and time.  Psychiatric:        Mood and Affect: Mood normal.        Behavior: Behavior normal.        Thought Content: Thought content normal.        Judgment: Judgment normal.     Results for orders placed or performed in visit on 09/25/22  POCT URINALYSIS DIP (CLINITEK)  Result Value Ref Range   Color, UA orange (A) yellow   Clarity, UA clear clear   Glucose, UA =500 (A) negative mg/dL    Bilirubin, UA negative negative   Ketones, POC UA negative negative mg/dL   Spec Grav, UA 7.017 7.939 - 1.025   Blood, UA trace-intact (A) negative   pH, UA 6.0 5.0 - 8.0   POC PROTEIN,UA trace negative, trace   Urobilinogen, UA 1.0 0.2 or 1.0 E.U./dL   Nitrite, UA Positive (A) Negative   Leukocytes, UA Trace (A) Negative        Assessment & Plan:   Problem List Items Addressed This Visit       Genitourinary   Acute cystitis without hematuria - Primary    - Given keflex and diflucan for yeast infection  - will culture urine - POC UA interpreted by myself as positive leukocytes and nitrites      Relevant Orders   Urine Culture   Other Visit Diagnoses     Frequency of urination       Relevant Orders   POCT URINALYSIS DIP (CLINITEK) (Completed)       Meds ordered this encounter  Medications   cephALEXin (KEFLEX) 500 MG capsule    Sig: Take  1 capsule (500 mg total) by mouth 2 (two) times daily for 7 days.    Dispense:  14 capsule    Refill:  0   fluconazole (DIFLUCAN) 150 MG tablet    Sig: Take 1 tablet (150 mg total) by mouth once for 1 dose. If no better in 72 hours take second tablet    Dispense:  2 tablet    Refill:  0    Return if symptoms worsen or fail to improve.  Charlton Amor, DO

## 2022-09-25 NOTE — Assessment & Plan Note (Addendum)
-   Given keflex and diflucan for yeast infection  - will culture urine - POC UA interpreted by myself as positive leukocytes and nitrites

## 2022-09-30 LAB — URINE CULTURE
MICRO NUMBER:: 14842688
SPECIMEN QUALITY:: ADEQUATE

## 2022-09-30 LAB — EXTRA URINE SPECIMEN

## 2022-10-04 ENCOUNTER — Ambulatory Visit (INDEPENDENT_AMBULATORY_CARE_PROVIDER_SITE_OTHER): Payer: PRIVATE HEALTH INSURANCE | Admitting: Physician Assistant

## 2022-10-04 VITALS — BP 131/78 | HR 74 | Ht 64.0 in | Wt 214.0 lb

## 2022-10-04 DIAGNOSIS — R7611 Nonspecific reaction to tuberculin skin test without active tuberculosis: Secondary | ICD-10-CM

## 2022-10-04 DIAGNOSIS — E118 Type 2 diabetes mellitus with unspecified complications: Secondary | ICD-10-CM

## 2022-10-04 DIAGNOSIS — E785 Hyperlipidemia, unspecified: Secondary | ICD-10-CM

## 2022-10-04 DIAGNOSIS — I10 Essential (primary) hypertension: Secondary | ICD-10-CM

## 2022-10-04 DIAGNOSIS — Z111 Encounter for screening for respiratory tuberculosis: Secondary | ICD-10-CM

## 2022-10-04 DIAGNOSIS — Z7984 Long term (current) use of oral hypoglycemic drugs: Secondary | ICD-10-CM

## 2022-10-04 DIAGNOSIS — R110 Nausea: Secondary | ICD-10-CM

## 2022-10-04 LAB — POCT UA - MICROALBUMIN
Creatinine, POC: 100 mg/dL
Microalbumin Ur, POC: 30 mg/L

## 2022-10-04 LAB — POCT GLYCOSYLATED HEMOGLOBIN (HGB A1C): Hemoglobin A1C: 5.9 % — AB (ref 4.0–5.6)

## 2022-10-04 MED ORDER — TIRZEPATIDE 7.5 MG/0.5ML ~~LOC~~ SOAJ
7.5000 mg | SUBCUTANEOUS | 0 refills | Status: DC
Start: 2022-10-04 — End: 2023-02-20

## 2022-10-04 MED ORDER — ONDANSETRON 8 MG PO TBDP
8.0000 mg | ORAL_TABLET | Freq: Three times a day (TID) | ORAL | 1 refills | Status: DC | PRN
Start: 2022-10-04 — End: 2023-05-25

## 2022-10-04 NOTE — Progress Notes (Unsigned)
   Established Patient Office Visit  Subjective   Patient ID: MILEIGH TILLEY, female    DOB: 12-Jan-1981  Age: 42 y.o. MRN: [id number]  No chief complaint on file.   HPI  Mounjaro taking  Foot exam good 155 on cbc  ?keisha  .Marland Kitchen Active Ambulatory Problems    Diagnosis Date Noted   Dyslipidemia, goal LDL below 70 04/27/2010   Iron deficiency anemia 01/21/2010   HYPERTENSION, BENIGN ESSENTIAL 04/26/2006   WEIGHT GAIN 11/13/2008   MIGRAINE HEADACHE 06/28/2010   Menorrhagia 07/03/2013   Crohn's disease 03/30/2014   Type 2 diabetes mellitus, controlled (HCC) 06/08/2014   Insomnia 06/20/2015   Absolute anemia 09/07/2015   Vitamin D deficiency 10/03/2016   Morbidly obese 10/20/2016   Chronic fatigue syndrome 02/18/2017   Chronic left-sided low back pain without sciatica 07/08/2017   Anxiety 07/08/2017   Hidradenitis suppurativa 11/09/2017   Hyperpigmentation of skin, postinflammatory 11/09/2017   Acanthosis nigricans 11/09/2017   Dyslipidemia (high LDL; low HDL) 03/20/2018   No energy 12/30/2018   Breast pain, right 12/30/2018   Abscess of left elbow 07/03/2019   Abscess 01/27/2020   COVID-19 virus infection 01/27/2020   Anemia 01/27/2020   Microalbuminuria 04/05/2021   Uncontrolled type 2 diabetes mellitus with hyperglycemia 04/05/2021   Arthralgia 04/05/2021   Nausea 04/05/2021   Ear pressure, left 04/15/2021   Conductive hearing loss 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 04/18/2021   Acute nonintractable headache 04/18/2021   Crohn's disease of large intestine without complication 03/30/2014   Long-term current use of high risk medication other than anticoagulant 07/05/2018   Myalgia 06/29/2021   Immunity status testing 10/04/2021   Crohn's disease of small intestine without complication 10/04/2021   Vaccine counseling 10/04/2021   Benign breast cyst in female, right 12/30/2021   Performance anxiety 04/17/2022   Acute cystitis without hematuria  09/25/2022   Resolved Ambulatory Problems    Diagnosis Date Noted   ABSCESS OF BARTHOLINS GLAND 02/19/2009   CARBUNCLE/FURUNCLE NOS 04/26/2006   SKIN RASH 11/13/2008   SYMPTOM, FREQUENCY, URINARY 04/26/2006   Abdominal pain, epigastric 04/27/2010   Impaired fasting glucose 04/27/2010   NEOPLASM UNCERTAIN BHV OTH&UNSPEC FE GENIT ORGN 08/10/2010   VAGINAL DISCHARGE 08/10/2010   FOLLICULITIS 08/10/2010   SORE THROAT 01/10/2011   ABDOMINAL PAIN 01/10/2011   Hyperlipidemia 10/14/2012   Obesity, unspecified 10/14/2012   Other iron deficiency anemias 03/17/2013   Elevated hemoglobin A1c 05/11/2014   Abscess of left axilla 12/31/2014   Bilateral lower abdominal pain 09/07/2015   Ruptured ear drum, right 08/29/2017   Past Medical History:  Diagnosis Date   Hypercholesterolemia    Hypertension    IBS (irritable bowel syndrome)    Other specified iron deficiency anemias 03/17/2013     ROS    Objective:     Ht  (1.626 m)   Wt 214 lb (97.1 kg)   BMI 36.73 kg/m  BP Readings from Last 3 Encounters:  10/04/22 131/78  09/25/22 122/88  04/17/22 120/86   Wt Readings from Last 3 Encounters:  10/04/22 214 lb (97.1 kg)  09/25/22 216 lb (98 kg)  04/17/22 208 lb (94.3 kg)      Physical Exam     The 10-year ASCVD risk score (Arnett DK, et al., 2019) is: 1.2%    Assessment & Plan:     No follow-ups on file.    Tandy Gaw, PA-C

## 2022-10-05 ENCOUNTER — Encounter: Payer: Self-pay | Admitting: Physician Assistant

## 2022-10-06 LAB — QUANTIFERON-TB GOLD PLUS
Mitogen-NIL: >10 IU/mL
NIL: 0.04 IU/mL
QuantiFERON-TB Gold Plus: NEGATIVE
TB1-NIL: 0.05 IU/mL
TB2-NIL: 0.07 IU/mL

## 2022-10-06 NOTE — Progress Notes (Signed)
Negative TB screening

## 2022-10-09 ENCOUNTER — Telehealth: Payer: Self-pay | Admitting: Physician Assistant

## 2022-10-09 NOTE — Telephone Encounter (Signed)
Pt is requesting a stronger medication for a UTI that has come back. Pt was seen by Dr. Tamera Punt on 09/25/2022 for this issue. Pt was offered an appointment but she declined.

## 2022-11-20 ENCOUNTER — Other Ambulatory Visit: Payer: Self-pay | Admitting: Physician Assistant

## 2022-11-20 DIAGNOSIS — N6315 Unspecified lump in the right breast, overlapping quadrants: Secondary | ICD-10-CM

## 2022-11-27 ENCOUNTER — Other Ambulatory Visit: Payer: Self-pay | Admitting: Physician Assistant

## 2022-11-27 DIAGNOSIS — N6315 Unspecified lump in the right breast, overlapping quadrants: Secondary | ICD-10-CM

## 2022-12-05 LAB — HM MAMMOGRAPHY

## 2022-12-07 ENCOUNTER — Other Ambulatory Visit: Payer: Self-pay | Admitting: Physician Assistant

## 2022-12-07 DIAGNOSIS — E118 Type 2 diabetes mellitus with unspecified complications: Secondary | ICD-10-CM

## 2022-12-22 ENCOUNTER — Encounter: Payer: Self-pay | Admitting: Physician Assistant

## 2023-02-20 ENCOUNTER — Ambulatory Visit: Payer: PRIVATE HEALTH INSURANCE | Admitting: Physician Assistant

## 2023-02-20 VITALS — BP 129/78 | HR 87 | Ht 64.0 in | Wt 212.0 lb

## 2023-02-20 DIAGNOSIS — E1169 Type 2 diabetes mellitus with other specified complication: Secondary | ICD-10-CM | POA: Diagnosis not present

## 2023-02-20 DIAGNOSIS — E118 Type 2 diabetes mellitus with unspecified complications: Secondary | ICD-10-CM | POA: Diagnosis not present

## 2023-02-20 DIAGNOSIS — R8290 Unspecified abnormal findings in urine: Secondary | ICD-10-CM

## 2023-02-20 DIAGNOSIS — R300 Dysuria: Secondary | ICD-10-CM

## 2023-02-20 DIAGNOSIS — Z6836 Body mass index (BMI) 36.0-36.9, adult: Secondary | ICD-10-CM

## 2023-02-20 DIAGNOSIS — Z7984 Long term (current) use of oral hypoglycemic drugs: Secondary | ICD-10-CM

## 2023-02-20 DIAGNOSIS — E785 Hyperlipidemia, unspecified: Secondary | ICD-10-CM | POA: Diagnosis not present

## 2023-02-20 DIAGNOSIS — I10 Essential (primary) hypertension: Secondary | ICD-10-CM | POA: Diagnosis not present

## 2023-02-20 DIAGNOSIS — L089 Local infection of the skin and subcutaneous tissue, unspecified: Secondary | ICD-10-CM

## 2023-02-20 LAB — POCT URINALYSIS DIP (CLINITEK)
Bilirubin, UA: NEGATIVE
Blood, UA: NEGATIVE
Glucose, UA: >=1000 mg/dL — AB
Ketones, POC UA: NEGATIVE mg/dL
Leukocytes, UA: NEGATIVE
Nitrite, UA: NEGATIVE
POC PROTEIN,UA: NEGATIVE
Spec Grav, UA: 1.025 (ref 1.010–1.025)
Urobilinogen, UA: 0.2 U/dL
pH, UA: 6 (ref 5.0–8.0)

## 2023-02-20 LAB — POCT GLYCOSYLATED HEMOGLOBIN (HGB A1C): Hemoglobin A1C: 5.4 % (ref 4.0–5.6)

## 2023-02-20 MED ORDER — TIRZEPATIDE 7.5 MG/0.5ML ~~LOC~~ SOAJ: 7.5000 mg | SUBCUTANEOUS | 0 refills | Status: DC

## 2023-02-20 MED ORDER — MUPIROCIN 2 % EX OINT
TOPICAL_OINTMENT | CUTANEOUS | 2 refills | Status: AC
Start: 2023-02-20 — End: ?

## 2023-02-20 NOTE — Progress Notes (Signed)
No protein, blood, leuks. Looks great!

## 2023-02-20 NOTE — Progress Notes (Unsigned)
Established Patient Office Visit  Subjective   Patient ID: Stacie Cardenas, female    DOB: 18-Nov-1980  Age: 42 y.o. MRN: [id number]  Chief Complaint  Patient presents with   Medical Management of Chronic Issues    HPI Pt is a 42 yo obese female with T2DM, HTN, HLD, IDA, Crohns disease who presents to the clinic for 3 month follow up.   Pt is doing great. She has had no recent flares of Crohns. She is tolerating her medication well. She is not checking sugars. She denies any CP, palpitations, headaches or vision changes. She is staying active.   She does have occasional bad urine odor. She would like her urine tested today. No flank or abdominal pain.    .. Active Ambulatory Problems    Diagnosis Date Noted   Dyslipidemia, goal LDL below 70 04/27/2010   Iron deficiency anemia 01/21/2010   HYPERTENSION, BENIGN ESSENTIAL 04/26/2006   WEIGHT GAIN 11/13/2008   MIGRAINE HEADACHE 06/28/2010   Menorrhagia 07/03/2013   Crohn's disease (HCC) 03/30/2014   Type 2 diabetes mellitus, controlled (HCC) 06/08/2014   Insomnia 06/20/2015   Absolute anemia 09/07/2015   Vitamin D deficiency 10/03/2016   Morbidly obese (HCC) 10/20/2016   Chronic fatigue syndrome 02/18/2017   Chronic left-sided low back pain without sciatica 07/08/2017   Anxiety 07/08/2017   Hidradenitis suppurativa 11/09/2017   Hyperpigmentation of skin, postinflammatory 11/09/2017   Acanthosis nigricans 11/09/2017   Dyslipidemia (high LDL; low HDL) 03/20/2018   No energy 12/30/2018   Breast pain, right 12/30/2018   Abscess of left elbow 07/03/2019   Abscess 01/27/2020   COVID-19 virus infection 01/27/2020   Anemia 01/27/2020   Microalbuminuria 04/05/2021   Uncontrolled type 2 diabetes mellitus with hyperglycemia (HCC) 04/05/2021   Arthralgia 04/05/2021   Nausea 04/05/2021   Ear pressure, left 04/15/2021   Conductive hearing loss 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 04/18/2021   Acute nonintractable  headache 04/18/2021   Crohn's disease of large intestine without complication (HCC) 03/30/2014   Long-term current use of high risk medication other than anticoagulant 07/05/2018   Myalgia 06/29/2021   Immunity status testing 10/04/2021   Crohn's disease of small intestine without complication (HCC) 10/04/2021   Vaccine counseling 10/04/2021   Benign breast cyst in female, right 12/30/2021   Performance anxiety 04/17/2022   Acute cystitis without hematuria 09/25/2022   Resolved Ambulatory Problems    Diagnosis Date Noted   ABSCESS OF BARTHOLINS GLAND 02/19/2009   CARBUNCLE/FURUNCLE NOS 04/26/2006   SKIN RASH 11/13/2008   SYMPTOM, FREQUENCY, URINARY 04/26/2006   Abdominal pain, epigastric 04/27/2010   Impaired fasting glucose 04/27/2010   NEOPLASM UNCERTAIN BHV OTH&UNSPEC FE GENIT ORGN 08/10/2010   VAGINAL DISCHARGE 08/10/2010   FOLLICULITIS 08/10/2010   SORE THROAT 01/10/2011   ABDOMINAL PAIN 01/10/2011   Hyperlipidemia 10/14/2012   Obesity, unspecified 10/14/2012   Other iron deficiency anemias 03/17/2013   Elevated hemoglobin A1c 05/11/2014   Abscess of left axilla 12/31/2014   Bilateral lower abdominal pain 09/07/2015   Ruptured ear drum, right 08/29/2017   Past Medical History:  Diagnosis Date   Hypercholesterolemia    Hypertension    IBS (irritable bowel syndrome)    Other specified iron deficiency anemias 03/17/2013    ROS   See HPI.  Objective:     BP 129/78   Pulse 87   Ht 5\' 4"  (1.626 m)   Wt 212 lb (96.2 kg)   SpO2 99%   BMI 36.39  kg/m  BP Readings from Last 3 Encounters:  02/20/23 129/78  10/04/22 131/78  09/25/22 122/88   Wt Readings from Last 3 Encounters:  02/20/23 212 lb (96.2 kg)  10/04/22 214 lb (97.1 kg)  09/25/22 216 lb (98 kg)    .Marland Kitchen    02/20/2023    9:28 AM 09/25/2022    3:37 PM 04/17/2022   10:06 AM 10/04/2021    3:07 PM 04/04/2021   10:13 AM  Depression screen PHQ 2/9  Decreased Interest 0 0 0 0 0  Down, Depressed, Hopeless 0 0  0 0 0  PHQ - 2 Score 0 0 0 0 0  Altered sleeping   0  0  Tired, decreased energy   1  0  Change in appetite   0  0  Feeling bad or failure about yourself    0  0  Trouble concentrating   0  0  Moving slowly or fidgety/restless   0  0  Suicidal thoughts   0  0  PHQ-9 Score   1  0  Difficult doing work/chores   Not difficult at all  Not difficult at all  ..  .. Results for orders placed or performed in visit on 02/20/23  POCT HgB A1C  Result Value Ref Range   Hemoglobin A1C 5.4 4.0 - 5.6 %   HbA1c POC (<> result, manual entry)     HbA1c, POC (prediabetic range)     HbA1c, POC (controlled diabetic range)    POCT URINALYSIS DIP (CLINITEK)  Result Value Ref Range   Color, UA yellow yellow   Clarity, UA clear clear   Glucose, UA >=1,000 (A) negative mg/dL   Bilirubin, UA negative negative   Ketones, POC UA negative negative mg/dL   Spec Grav, UA 4.332 9.518 - 1.025   Blood, UA negative negative   pH, UA 6.0 5.0 - 8.0   POC PROTEIN,UA negative negative, trace   Urobilinogen, UA 0.2 0.2 or 1.0 E.U./dL   Nitrite, UA Negative Negative   Leukocytes, UA Negative Negative      Physical Exam Constitutional:      Appearance: Normal appearance. She is obese.  HENT:     Head: Normocephalic.  Cardiovascular:     Rate and Rhythm: Normal rate and regular rhythm.     Pulses: Normal pulses.     Heart sounds: Normal heart sounds.  Pulmonary:     Effort: Pulmonary effort is normal.     Breath sounds: Normal breath sounds.  Musculoskeletal:     Cervical back: Neck supple.     Right lower leg: No edema.     Left lower leg: No edema.  Neurological:     General: No focal deficit present.     Mental Status: She is alert and oriented to person, place, and time.  Psychiatric:        Mood and Affect: Mood normal.       The 10-year ASCVD risk score (Arnett DK, et al., 2019) is: 2.7%    Assessment & Plan:  Marland KitchenMarland KitchenAlyssum was seen today for medical management of chronic  issues.  Diagnoses and all orders for this visit:  Controlled type 2 diabetes mellitus with complication, without long-term current use of insulin (HCC) -     POCT HgB A1C -     tirzepatide (MOUNJARO) 7.5 MG/0.5ML Pen; Inject 7.5 mg into the skin once a week.  Essential hypertension, benign  Dyslipidemia (high LDL; low HDL)  Dysuria -  POCT URINALYSIS DIP (CLINITEK) -     Urine Culture  Skin infection -     mupirocin ointment (BACTROBAN) 2 %; Apply to affected area TID for 7 days.  Class 2 severe obesity due to excess calories with serious comorbidity and body mass index (BMI) of 36.0 to 36.9 in adult [facility])  Bad odor of urine   A1C is to goal.  Continue on same medications BP to goal On statin Eye and foot exam UTD UA UTD needs to be abstracted Declined flu and covid vaccine today  UA no signs of infection Will send for culture Continue to hydrate Follow up as needed if having new or worsening symptoms.     Return in about 3 months (around 05/22/2023) for dM follow up.    Tandy Gaw, PA-C

## 2023-02-22 ENCOUNTER — Encounter: Payer: Self-pay | Admitting: Physician Assistant

## 2023-02-23 ENCOUNTER — Other Ambulatory Visit: Payer: Self-pay | Admitting: Physician Assistant

## 2023-02-23 ENCOUNTER — Telehealth: Payer: Self-pay | Admitting: Physician Assistant

## 2023-02-23 LAB — URINE CULTURE

## 2023-02-23 MED ORDER — NITROFURANTOIN MONOHYD MACRO 100 MG PO CAPS
100.0000 mg | ORAL_CAPSULE | Freq: Two times a day (BID) | ORAL | 0 refills | Status: DC
Start: 2023-02-23 — End: 2023-05-30

## 2023-02-23 MED ORDER — FLUCONAZOLE 150 MG PO TABS: 150.0000 mg | ORAL_TABLET | Freq: Once | ORAL | 0 refills | Status: AC

## 2023-02-23 NOTE — Telephone Encounter (Signed)
Pt notified. Roselyn Reef, CMA

## 2023-02-23 NOTE — Telephone Encounter (Signed)
Sent!

## 2023-02-23 NOTE — Telephone Encounter (Signed)
Pt called. Can pcp call in some Diflucan because she will get a yeast infection if she takes the antibiotic alone.

## 2023-02-23 NOTE — Progress Notes (Signed)
You do have bacteria in urine. Will send macrobid to treat.

## 2023-03-02 ENCOUNTER — Telehealth: Payer: Self-pay | Admitting: Family Medicine

## 2023-03-02 NOTE — Telephone Encounter (Signed)
Pls call pt: she is due for yearly protein test for diabets. We didn't do it last time bc had a urinary infection. Would she have time to come to lab for urine microalbumin in the next couple of weeks?

## 2023-03-06 NOTE — Telephone Encounter (Signed)
Sorry, needs a BMP, not a urine. I apologize. Good catch Marylene Land

## 2023-03-07 ENCOUNTER — Other Ambulatory Visit: Payer: Self-pay

## 2023-03-07 DIAGNOSIS — I10 Essential (primary) hypertension: Secondary | ICD-10-CM

## 2023-03-07 DIAGNOSIS — E118 Type 2 diabetes mellitus with unspecified complications: Secondary | ICD-10-CM

## 2023-03-07 NOTE — Telephone Encounter (Signed)
Patient informed and BMP order entered into patient chart.

## 2023-03-12 ENCOUNTER — Other Ambulatory Visit: Payer: Self-pay | Admitting: Physician Assistant

## 2023-03-12 DIAGNOSIS — E118 Type 2 diabetes mellitus with unspecified complications: Secondary | ICD-10-CM

## 2023-03-13 ENCOUNTER — Other Ambulatory Visit: Payer: Self-pay | Admitting: Physician Assistant

## 2023-03-13 ENCOUNTER — Telehealth: Payer: Self-pay | Admitting: Physician Assistant

## 2023-03-13 DIAGNOSIS — E118 Type 2 diabetes mellitus with unspecified complications: Secondary | ICD-10-CM

## 2023-03-13 NOTE — Telephone Encounter (Signed)
Patient called in stated that he refills were denied. Please advise.

## 2023-03-18 IMAGING — DX DG FINGER MIDDLE 2+V*L*
3 series · 3 of 3 positions shown · non-contrast
Comparison: No prior.

CLINICAL DATA: Interphalangeal joint pain.  No known injury.

EXAM:
LEFT MIDDLE FINGER 2+V

[finger ap]
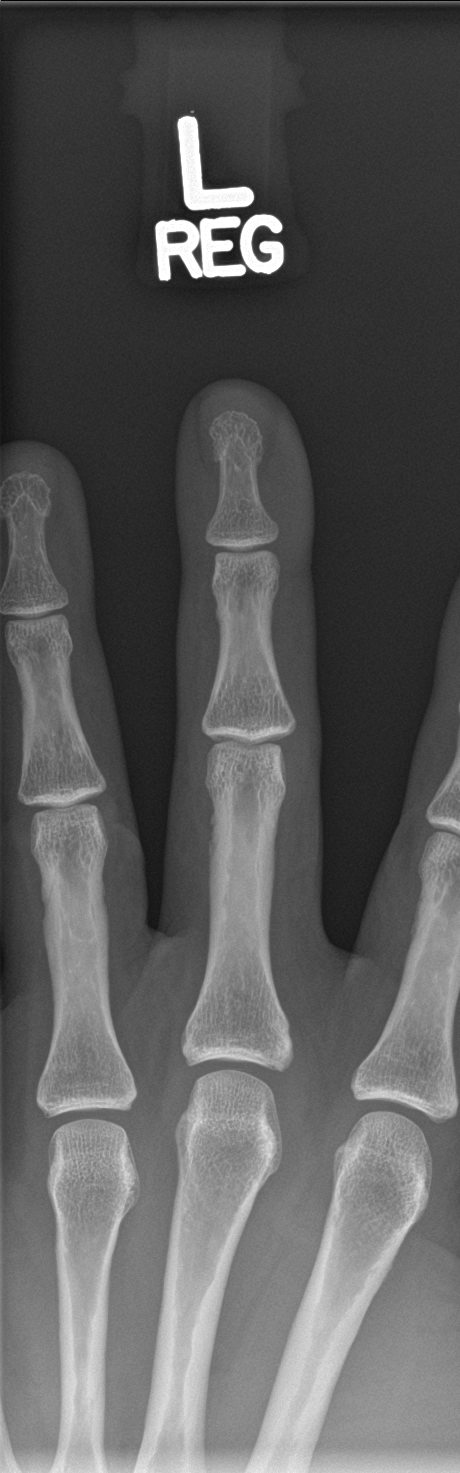

[finger obl]
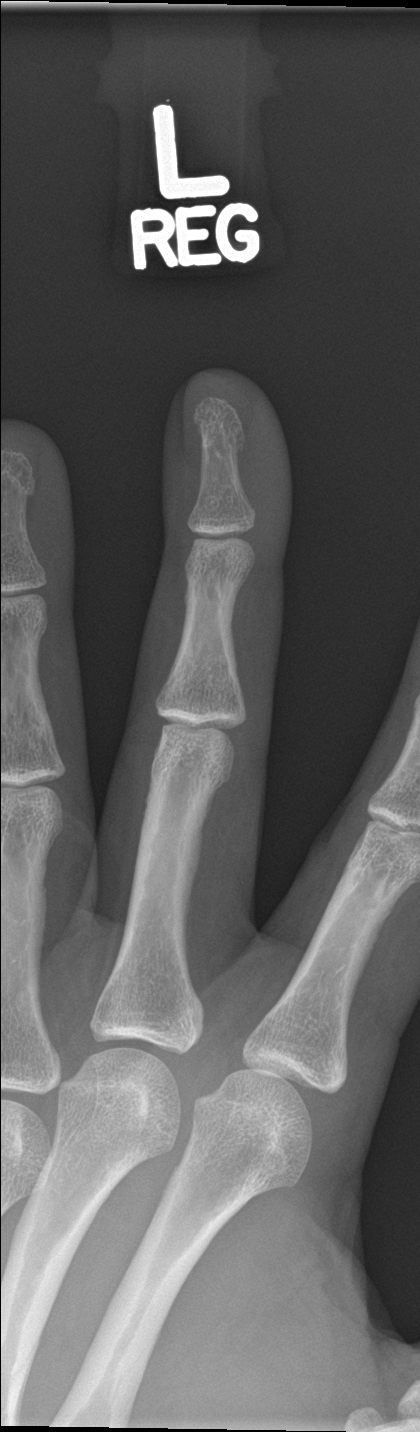

[finger lat]
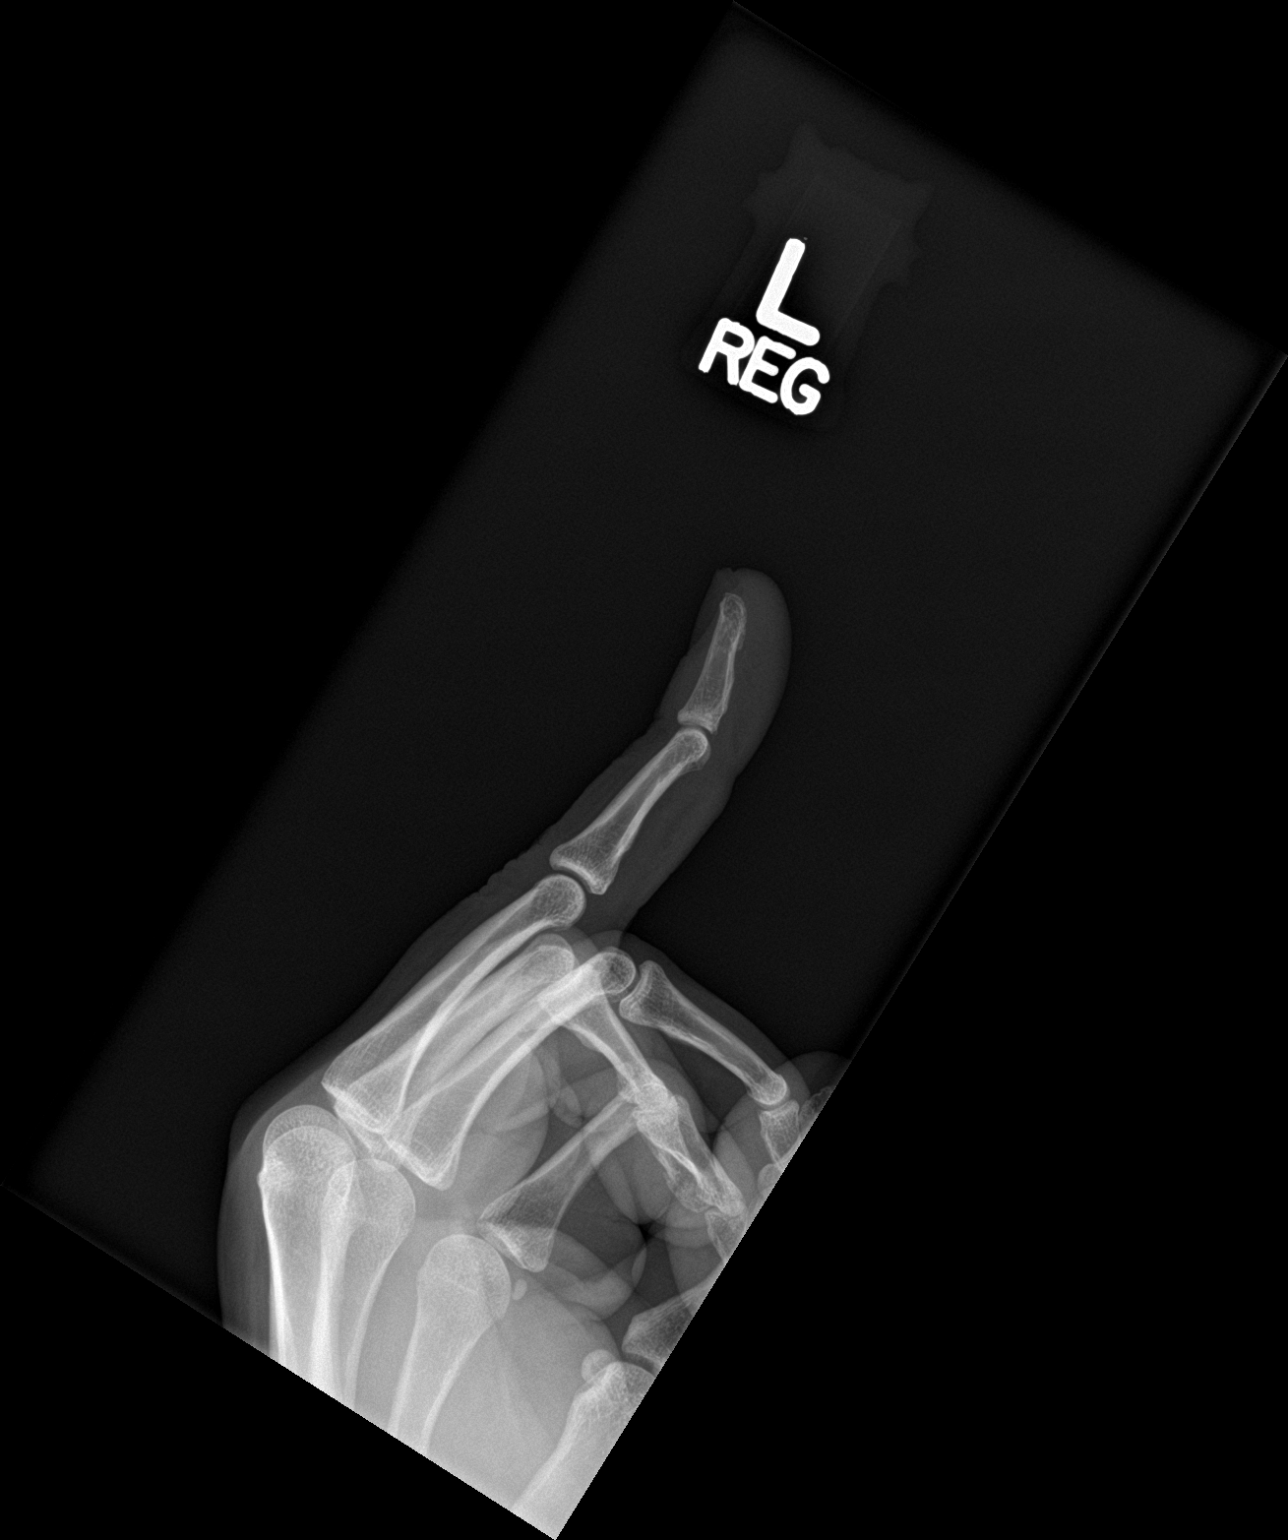

[3 of 3 positions shown; findings below may reference images not displayed]

FINDINGS: No acute bony or joint abnormality. No significant arthropathy. No
radiopaque foreign body.
IMPRESSION: Negative exam.

## 2023-04-03 IMAGING — MG MM DIGITAL SCREENING BILAT W/ TOMO AND CAD
8 series · 8 of 24 positions shown · non-contrast
Comparison: None.

CLINICAL DATA: Screening.

EXAM:
DIGITAL SCREENING BILATERAL MAMMOGRAM WITH TOMOSYNTHESIS AND CAD
TECHNIQUE: Bilateral screening digital craniocaudal and mediolateral oblique
mammograms were obtained. Bilateral screening digital breast
tomosynthesis was performed. The images were evaluated with
computer-aided detection.

[L CC synth-2D]
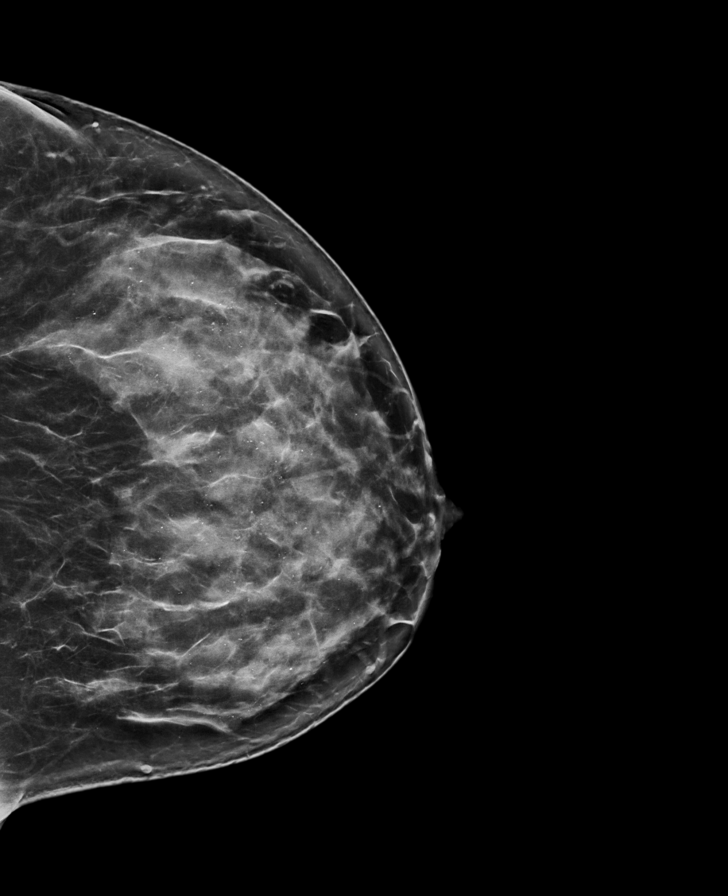

[R CC synth-2D]
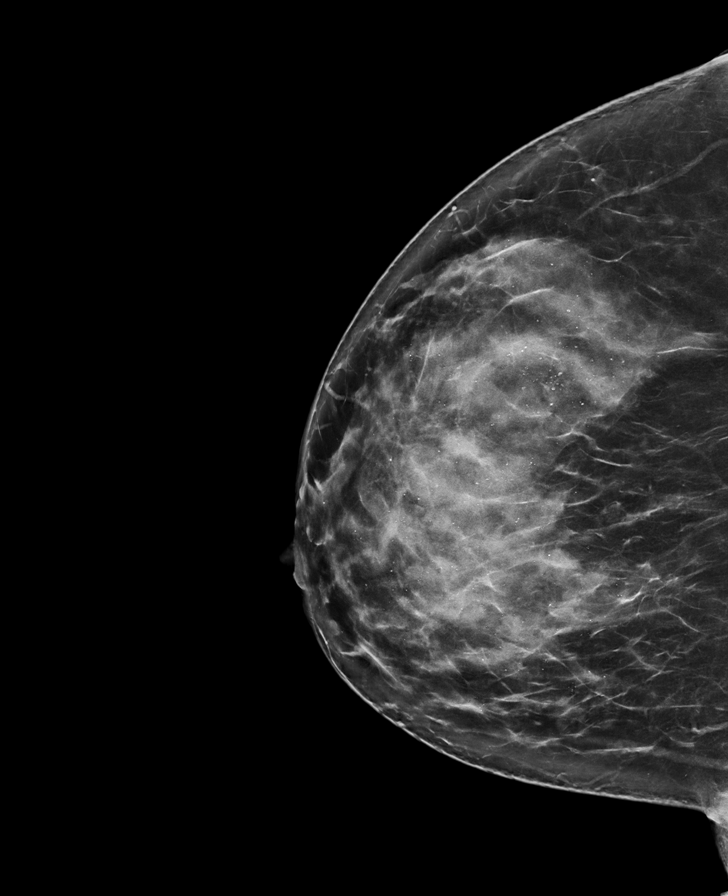

[L MLO synth-2D]
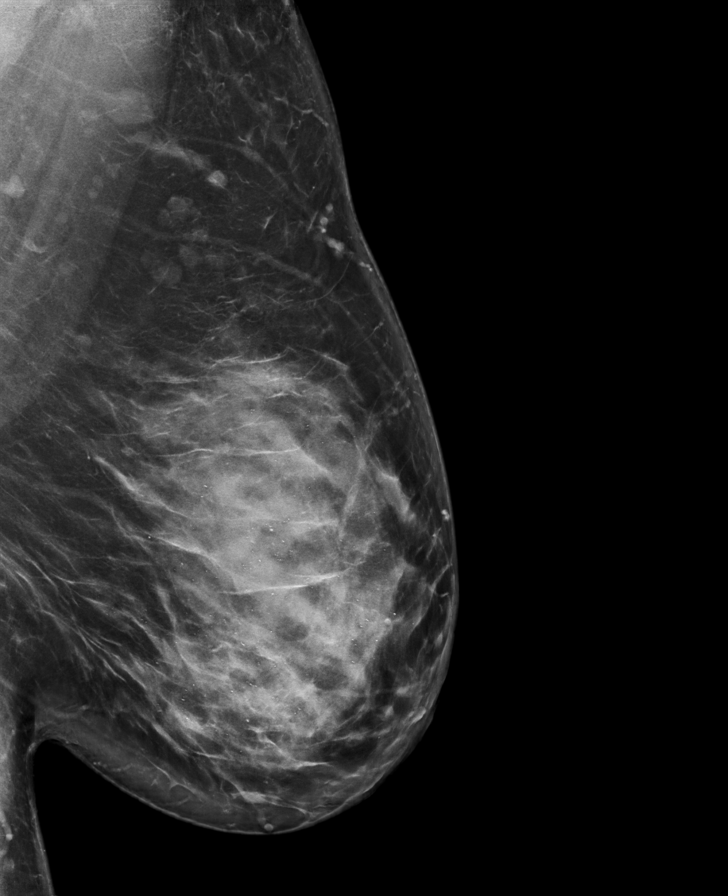

[R MLO synth-2D]
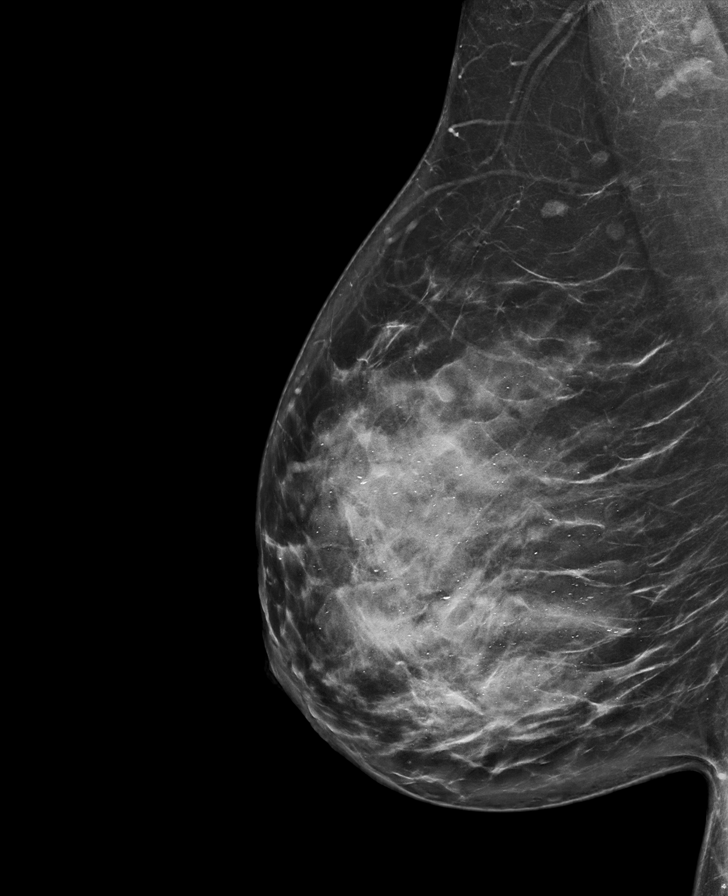

[L CC tomo · tomo slice 41/81.0]
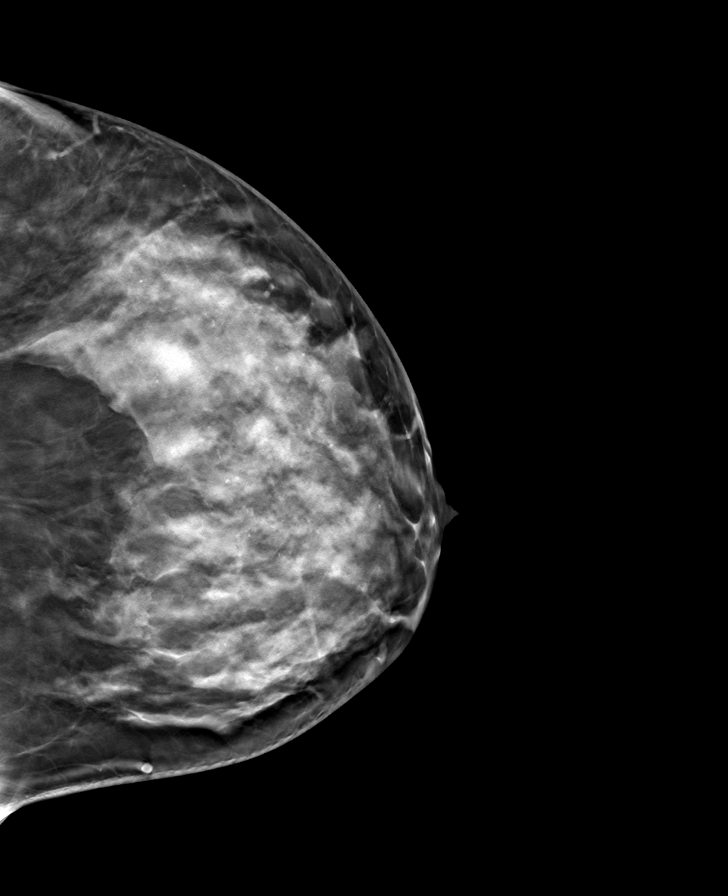

[L MLO tomo · tomo slice 51/101.0]
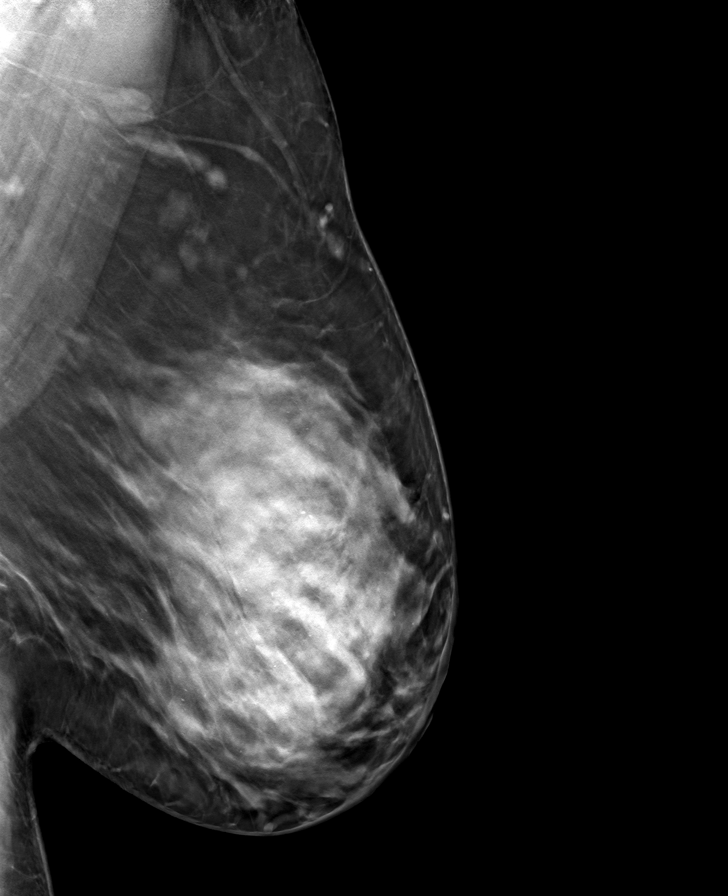

[R CC tomo · tomo slice 42/83.0]
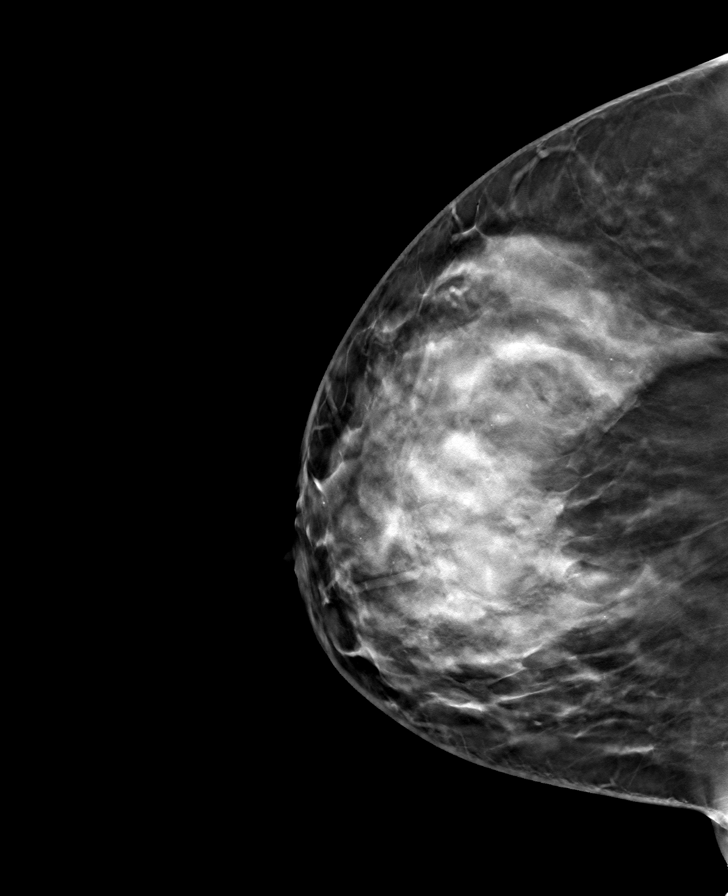

[R MLO tomo · tomo slice 44/87.0]
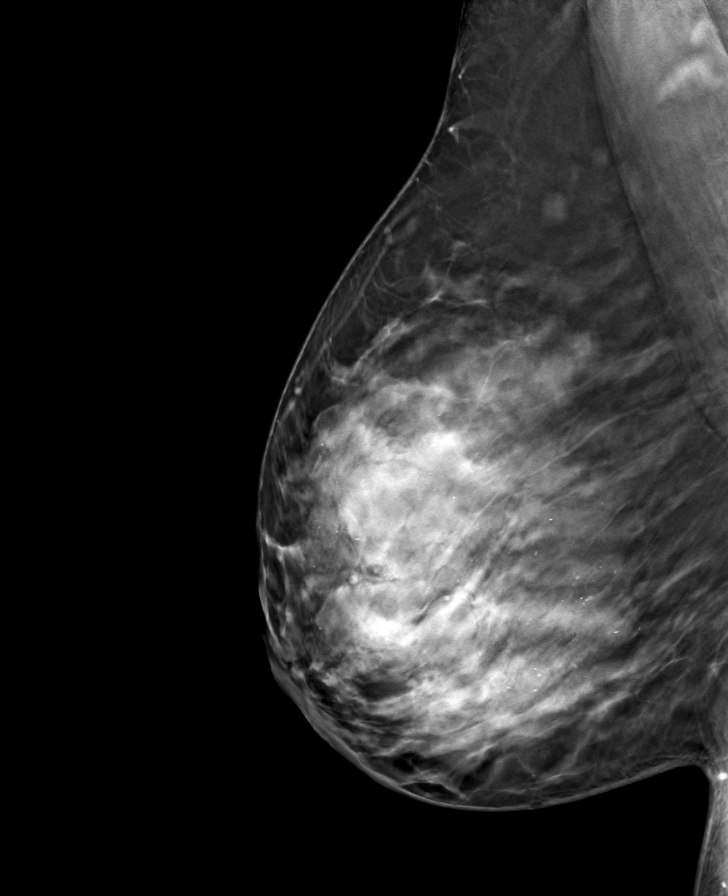

[8 of 24 positions shown; findings below may reference images not displayed]

ACR Breast Density Category d: The breast tissue is extremely dense,
which lowers the sensitivity of mammography.
FINDINGS: There are no findings suspicious for malignancy.
IMPRESSION: No mammographic evidence of malignancy. A result letter of this
screening mammogram will be mailed directly to the patient.

RECOMMENDATION:
Screening mammogram in one year. (Code:W3-Z-XIN)

BI-RADS CATEGORY  1: Negative.

## 2023-04-13 NOTE — Telephone Encounter (Signed)
Resolved called pt

## 2023-05-15 LAB — HM MAMMOGRAPHY

## 2023-05-15 LAB — COMPREHENSIVE METABOLIC PANEL: eGFR: >90

## 2023-05-23 ENCOUNTER — Other Ambulatory Visit: Payer: Self-pay | Admitting: Physician Assistant

## 2023-05-23 DIAGNOSIS — R110 Nausea: Secondary | ICD-10-CM

## 2023-05-30 ENCOUNTER — Ambulatory Visit (INDEPENDENT_AMBULATORY_CARE_PROVIDER_SITE_OTHER): Payer: PRIVATE HEALTH INSURANCE | Admitting: Physician Assistant

## 2023-05-30 ENCOUNTER — Encounter: Payer: Self-pay | Admitting: Physician Assistant

## 2023-05-30 VITALS — BP 115/79 | HR 81 | Resp 14 | Ht 64.0 in | Wt 211.4 lb

## 2023-05-30 DIAGNOSIS — E118 Type 2 diabetes mellitus with unspecified complications: Secondary | ICD-10-CM

## 2023-05-30 DIAGNOSIS — D509 Iron deficiency anemia, unspecified: Secondary | ICD-10-CM

## 2023-05-30 DIAGNOSIS — E1169 Type 2 diabetes mellitus with other specified complication: Secondary | ICD-10-CM | POA: Diagnosis not present

## 2023-05-30 DIAGNOSIS — E559 Vitamin D deficiency, unspecified: Secondary | ICD-10-CM

## 2023-05-30 DIAGNOSIS — Z7984 Long term (current) use of oral hypoglycemic drugs: Secondary | ICD-10-CM

## 2023-05-30 DIAGNOSIS — E785 Hyperlipidemia, unspecified: Secondary | ICD-10-CM

## 2023-05-30 DIAGNOSIS — I10 Essential (primary) hypertension: Secondary | ICD-10-CM

## 2023-05-30 DIAGNOSIS — Z0000 Encounter for general adult medical examination without abnormal findings: Secondary | ICD-10-CM | POA: Diagnosis not present

## 2023-05-30 MED ORDER — LOSARTAN POTASSIUM 50 MG PO TABS: 50.0000 mg | ORAL_TABLET | Freq: Every day | ORAL | 1 refills | Status: DC

## 2023-05-30 MED ORDER — MOUNJARO 7.5 MG/0.5ML ~~LOC~~ SOAJ: 7.5000 mg | SUBCUTANEOUS | 0 refills | Status: DC

## 2023-05-30 MED ORDER — REPATHA SURECLICK 140 MG/ML ~~LOC~~ SOAJ
140.0000 mg | SUBCUTANEOUS | 3 refills | Status: AC
Start: 2023-05-30 — End: ?

## 2023-05-30 MED ORDER — REPATHA SURECLICK 140 MG/ML ~~LOC~~ SOAJ: 140.0000 mg | SUBCUTANEOUS | 3 refills | Status: DC

## 2023-05-30 NOTE — Patient Instructions (Signed)

## 2023-05-30 NOTE — Addendum Note (Signed)
Addended by: Jomarie Longs on: 05/30/2023 09:37 AM   Modules accepted: Orders

## 2023-05-30 NOTE — Progress Notes (Signed)
Complete physical exam  Patient: Stacie Cardenas   DOB: March 24, 1981   42 y.o. Female  MRN: [id number]  Subjective:    Chief Complaint  Patient presents with   Annual Exam    Stacie Cardenas is a 42 y.o. female who presents today for a complete physical exam. She reports consuming a general diet. The patient does not participate in regular exercise at present. She generally feels well. She reports sleeping well. She does not have additional problems to discuss today.    Most recent fall risk assessment:    05/30/2023    9:24 AM  Fall Risk   Falls in the past year? 0  Injury with Fall? 0     Most recent depression screenings:    05/30/2023    9:31 AM 05/30/2023    9:24 AM  PHQ 2/9 Scores  PHQ - 2 Score 0 0    Vision:Within last year and Dental: No current dental problems and Receives regular dental care  Patient Active Problem List   Diagnosis Date Noted   Acute cystitis without hematuria 09/25/2022   Performance anxiety 04/17/2022   Benign breast cyst in female, right 12/30/2021   Immunity status testing 10/04/2021   Crohn's disease of small intestine without complication (HCC) 10/04/2021   Vaccine counseling 10/04/2021   Myalgia 06/29/2021   Conductive hearing loss 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 04/18/2021   Acute nonintractable headache 04/18/2021   Ear pressure, left 04/15/2021   Microalbuminuria 04/05/2021   Uncontrolled type 2 diabetes mellitus with hyperglycemia (HCC) 04/05/2021   Arthralgia 04/05/2021   Nausea 04/05/2021   Abscess 01/27/2020   COVID-19 virus infection 01/27/2020   Anemia 01/27/2020   Abscess of left elbow 07/03/2019   No energy 12/30/2018   Breast pain, right 12/30/2018   Long-term current use of high risk medication other than anticoagulant 07/05/2018   Dyslipidemia (high LDL; low HDL) 03/20/2018   Hidradenitis suppurativa 11/09/2017   Hyperpigmentation of skin, postinflammatory 11/09/2017   Acanthosis nigricans  11/09/2017   Chronic left-sided low back pain without sciatica 07/08/2017   Anxiety 07/08/2017   Chronic fatigue syndrome 02/18/2017   Morbidly obese (HCC) 10/20/2016   Vitamin D deficiency 10/03/2016   Absolute anemia 09/07/2015   Insomnia 06/20/2015   Controlled type 2 diabetes mellitus with other specified complication, without long-term current use of insulin (HCC) 06/08/2014   Crohn's disease (HCC) 03/30/2014   Crohn's disease of large intestine without complication (HCC) 03/30/2014   Menorrhagia 07/03/2013   Migraine headache 06/28/2010   Dyslipidemia, goal LDL below 70 04/27/2010   Iron deficiency anemia 01/21/2010   WEIGHT GAIN 11/13/2008   HYPERTENSION, BENIGN ESSENTIAL 04/26/2006   Past Medical History:  Diagnosis Date   Crohn's disease (HCC) 03/30/2014   Biopsies done 03/23/14.  Digestive health management.     Hypercholesterolemia    Hypertension    IBS (irritable bowel syndrome)    Other specified iron deficiency anemias 03/17/2013   Type 2 diabetes mellitus, controlled (HCC) 06/08/2014   Past Surgical History:  Procedure Laterality Date   CESAREAN SECTION     History reviewed. No pertinent family history. Allergies  Allergen Reactions   Metformin Other (See Comments)    Vaginal yeast infections   Ampicillin Diarrhea   Doxycycline     Nausea/vomiting   Rosuvastatin Other (See Comments)   Xigduo Xr [Dapagliflozin Pro-Metformin Er]     Yeast infections.       Patient Care Team: Jomarie Longs,  PA-C as PCP - General (Family Medicine) Gabriel Carina, RPH (Inactive) (Pharmacist)   Outpatient Medications Prior to Visit  Medication Sig   blood glucose meter kit and supplies KIT Dispense based on patient and insurance preference. Use up to four times daily as directed. Please include lancets, test strips, control solution.   fluticasone (FLONASE) 50 MCG/ACT nasal spray Place 2 sprays into both nostrils daily.   JARDIANCE 10 MG TABS tablet Take 1 tablet (10  mg total) by mouth daily.   mupirocin ointment (BACTROBAN) 2 % Apply to affected area TID for 7 days.   ondansetron (ZOFRAN-ODT) 8 MG disintegrating tablet Take 1 tablet (8 mg total) by mouth every 8 (eight) hours as needed for nausea or vomiting.   propranolol (INDERAL) 10 MG tablet Take one to four tablets 30 minutes before test.   vedolizumab (ENTYVIO) 300 MG injection Inject 300 mg into the vein every 30 (thirty) days.   Vitamin D, Ergocalciferol, (DRISDOL) 1.25 MG (50000 UNIT) CAPS capsule Take 1 capsule (50,000 Units total) by mouth every 7 (seven) days. Take for 8 total doses(weeks)   [DISCONTINUED] Evolocumab (REPATHA SURECLICK) 140 MG/ML SOAJ Inject 140 mg into the skin every 14 (fourteen) days.   [DISCONTINUED] losartan (COZAAR) 50 MG tablet Take 50 mg by mouth Once Daily.   [DISCONTINUED] MOUNJARO 7.5 MG/0.5ML Pen Inject contents of 1 syringe (7.5 mg) into the skin once a week.   [DISCONTINUED] nitrofurantoin, macrocrystal-monohydrate, (MACROBID) 100 MG capsule Take 1 capsule (100 mg total) by mouth 2 (two) times daily.   No facility-administered medications prior to visit.    Review of Systems  All other systems reviewed and are negative.         Objective:     BP 115/79 (BP Location: Right Arm, Patient Position: Sitting)   Pulse 81   Resp 14   Ht 5\' 4"  (1.626 m)   Wt 211 lb 6.4 oz (95.9 kg)   SpO2 99%   BMI 36.29 kg/m  BP Readings from Last 3 Encounters:  05/30/23 115/79  02/20/23 129/78  10/04/22 131/78   Wt Readings from Last 3 Encounters:  05/30/23 211 lb 6.4 oz (95.9 kg)  02/20/23 212 lb (96.2 kg)  10/04/22 214 lb (97.1 kg)      Physical Exam  BP 115/79 (BP Location: Right Arm, Patient Position: Sitting)   Pulse 81   Resp 14   Ht 5\' 4"  (1.626 m)   Wt 211 lb 6.4 oz (95.9 kg)   SpO2 99%   BMI 36.29 kg/m   General Appearance:    Alert, cooperative, obese, no distress, appears stated age  Head:    Normocephalic, without obvious abnormality,  atraumatic  Eyes:    PERRL, conjunctiva/corneas clear, EOM's intact, fundi    benign, both eyes  Ears:    Normal TM's and external ear canals, both ears  Nose:   Nares normal, septum midline, mucosa normal, no drainage    or sinus tenderness  Throat:   Lips, mucosa, and tongue normal; teeth and gums normal  Neck:   Supple, symmetrical, trachea midline, no adenopathy;    thyroid:  no enlargement/tenderness/nodules; no carotid   bruit or JVD  Back:     Symmetric, no curvature, ROM normal, no CVA tenderness  Lungs:     Clear to auscultation bilaterally, respirations unlabored  Chest Wall:    No tenderness or deformity   Heart:    Regular rate and rhythm, S1 and S2 normal, no murmur, rub  or gallop     Abdomen:     Soft, non-tender, bowel sounds active all four quadrants,    no masses, no organomegaly        Extremities:   Extremities normal, atraumatic, no cyanosis or edema  Pulses:   2+ and symmetric all extremities  Skin:   Skin color, texture, turgor normal, no rashes or lesions  Lymph nodes:   Cervical, supraclavicular, and axillary nodes normal  Neurologic:   CNII-XII intact, normal strength, sensation and reflexes    throughout       Assessment & Plan:    Routine Health Maintenance and Physical Exam  Immunization History  Administered Date(s) Administered   Hepatitis A, Ped/Adol-2 Dose 02/12/2020   Hpv-Unspecified 04/26/2006   Influenza Inj Mdck Quad Pf 04/20/2014   Influenza,inj,Quad PF,6+ Mos 04/20/2014, 02/03/2015, 03/20/2018   Influenza-Unspecified 04/08/1981, 03/03/2020, 03/14/2021, 03/24/2022   Pneumococcal Conjugate-13 02/27/2019   Pneumococcal Polysaccharide-23 08/14/2014, 02/12/2020   Tdap 03/14/2012, 09/13/2021    Health Maintenance  Topic Date Due   Diabetic kidney evaluation - eGFR measurement  04/04/2022   COVID-19 Vaccine (1) 06/15/2023 (Originally 01/06/1986)   INFLUENZA VACCINE  09/10/2023 (Originally 01/11/2023)   HPV VACCINES (2 - Risk 3-dose series)  02/20/2024 (Originally 05/24/2006)   MAMMOGRAM  06/06/2023   OPHTHALMOLOGY EXAM  06/08/2023   HEMOGLOBIN A1C  08/20/2023   Diabetic kidney evaluation - Urine ACR  10/04/2023   Cervical Cancer Screening (HPV/Pap Cotest)  12/27/2023   FOOT EXAM  02/20/2024   DTaP/Tdap/Td (3 - Td or Tdap) 09/14/2031   Hepatitis C Screening  Completed   HIV Screening  Completed    Discussed health benefits of physical activity, and encouraged her to engage in regular exercise appropriate for her age and condition.  Marland KitchenZella Ball was seen today for annual exam.  Diagnoses and all orders for this visit:  Routine adult health maintenance -     Lipid panel -     Hemoglobin A1c -     TSH -     CBC w/Diff/Platelet -     VITAMIN D 25 Hydroxy (Vit-D Deficiency, Fractures) -     Fe+TIBC+Fer  Dyslipidemia, goal LDL below 70 -     Lipid panel  Iron deficiency anemia, unspecified iron deficiency anemia type -     CBC w/Diff/Platelet -     Fe+TIBC+Fer  Controlled type 2 diabetes mellitus with other specified complication, without long-term current use of insulin (HCC) -     Hemoglobin A1c  Vitamin D deficiency -     VITAMIN D 25 Hydroxy (Vit-D Deficiency, Fractures)  Dyslipidemia (high LDL; low HDL) -     Evolocumab (REPATHA SURECLICK) 140 MG/ML SOAJ; Inject 140 mg into the skin every 14 (fourteen) days.  Controlled type 2 diabetes mellitus with complication, without long-term current use of insulin (HCC) -     tirzepatide (MOUNJARO) 7.5 MG/0.5ML Pen; Inject 7.5 mg into the skin once a week.  HYPERTENSION, BENIGN ESSENTIAL -     losartan (COZAAR) 50 MG tablet; Take 1 tablet (50 mg total) by mouth daily. Take 50 mg by mouth Once Daily.   .. Discussed 150 minutes of exercise a week.  Encouraged vitamin D 1000 units and Calcium 1300mg  or 4 servings of dairy a day.  Vitals look great PHQ no concerns Fasting labs ordered Mammogram schedule Pap UTD Declined flu and covid vaccines Eye exam UTD A1C  ordered in labs  Refilled medications   Return in about 3 months (around 08/28/2023).  Tandy Gaw, PA-C

## 2023-05-31 LAB — LIPID PANEL
Chol/HDL Ratio: 4.5 {ratio} — ABNORMAL HIGH (ref 0.0–4.4)
Cholesterol, Total: 272 mg/dL — ABNORMAL HIGH (ref 100–199)
HDL: 61 mg/dL (ref >39)
LDL Chol Calc (NIH): 197 mg/dL — ABNORMAL HIGH (ref 0–99)
Triglycerides: 82 mg/dL (ref 0–149)
VLDL Cholesterol Cal: 14 mg/dL (ref 5–40)

## 2023-05-31 LAB — CBC WITH DIFFERENTIAL/PLATELET
Basophils Absolute: 0 10*3/uL (ref 0.0–0.2)
Basos: 1 %
EOS (ABSOLUTE): 0.2 10*3/uL (ref 0.0–0.4)
Eos: 3 %
Hematocrit: 44.5 % (ref 34.0–46.6)
Hemoglobin: 13.7 g/dL (ref 11.1–15.9)
Immature Grans (Abs): 0 10*3/uL (ref 0.0–0.1)
Immature Granulocytes: 0 %
Lymphocytes Absolute: 3.6 10*3/uL — ABNORMAL HIGH (ref 0.7–3.1)
Lymphs: 44 %
MCH: 27.3 pg (ref 26.6–33.0)
MCHC: 30.8 g/dL — ABNORMAL LOW (ref 31.5–35.7)
MCV: 89 fL (ref 79–97)
Monocytes Absolute: 0.5 10*3/uL (ref 0.1–0.9)
Monocytes: 6 %
Neutrophils Absolute: 3.8 10*3/uL (ref 1.4–7.0)
Neutrophils: 46 %
Platelets: 261 10*3/uL (ref 150–450)
RBC: 5.02 x10E6/uL (ref 3.77–5.28)
RDW: 13.6 % (ref 11.7–15.4)
WBC: 8.2 10*3/uL (ref 3.4–10.8)

## 2023-05-31 LAB — TSH: TSH: 1.67 u[IU]/mL (ref 0.450–4.500)

## 2023-05-31 LAB — HEMOGLOBIN A1C
Est. average glucose Bld gHb Est-mCnc: 131 mg/dL
Hgb A1c MFr Bld: 6.2 % — ABNORMAL HIGH (ref 4.8–5.6)

## 2023-05-31 LAB — IRON,TIBC AND FERRITIN PANEL
Ferritin: 98 ng/mL (ref 15–150)
Iron Saturation: 12 % — ABNORMAL LOW (ref 15–55)
Iron: 36 ug/dL (ref 27–159)
Total Iron Binding Capacity: 290 ug/dL (ref 250–450)
UIBC: 254 ug/dL (ref 131–425)

## 2023-05-31 LAB — VITAMIN D 25 HYDROXY (VIT D DEFICIENCY, FRACTURES): Vit D, 25-Hydroxy: 12.5 ng/mL — ABNORMAL LOW (ref 30.0–100.0)

## 2023-06-04 ENCOUNTER — Telehealth: Payer: Self-pay | Admitting: Physician Assistant

## 2023-06-04 ENCOUNTER — Other Ambulatory Visit: Payer: Self-pay | Admitting: Physician Assistant

## 2023-06-04 MED ORDER — VITAMIN D (ERGOCALCIFEROL) 1.25 MG (50000 UNIT) PO CAPS: 50000.0000 [IU] | ORAL_CAPSULE | ORAL | 0 refills | Status: DC

## 2023-06-04 NOTE — Progress Notes (Signed)
Stacie Cardenas,   Your vitamin D is VERY low. High dose weekly and then recheck in 3 months.   LDL not to goal of under 70. You are at 197. You need to start repatha if you cannot tolerate statins.   Thyroid looks good.   Iron on low side. Increase iron rich foods or start a daily iron supplement.

## 2023-06-04 NOTE — Telephone Encounter (Signed)
Copied from CRM [id number]. Topic: Referral - Question >> Jun 04, 2023 11:38 AM Herbert Seta B wrote: Reason for CRM: Imaging calling on behalf of patient, needs signed order for Mammogram scheduled 12/17. Fax [phone]

## 2023-06-07 ENCOUNTER — Telehealth: Payer: Self-pay

## 2023-06-07 ENCOUNTER — Telehealth: Payer: Self-pay | Admitting: Physician Assistant

## 2023-06-07 NOTE — Telephone Encounter (Signed)
Spoke with patient. Message sent to provider in separate message regarding this.

## 2023-06-07 NOTE — Telephone Encounter (Signed)
Copied from CRM [id number]. Topic: General - Other >> Jun 07, 2023  1:24 PM Archie Patten S wrote: Reason for CRM: Patient has an mammogram scheduled tomorrow. Called [facility], Tandy Gaw is not in the office today so the order cannot be put in today. Patient advised. Patient's callback number is [phone].

## 2023-06-07 NOTE — Telephone Encounter (Signed)
Spoke with [facility]  States orders have been signed by "cronin" and patient can keep appt schld for tomorrow.

## 2023-06-07 NOTE — Telephone Encounter (Signed)
Copied from CRM [id number]. Topic: General - Other >> Jun 07, 2023  1:54 PM Carloyn Manner C wrote: Reason for CRM: Melissa from [facility] called regarding the follow up with the referral that was supposed to be given and completed out by PA Tandy Gaw as the patient has her mammogram appointment tomorrow 06/08/2023 and is very anxious about the process. Reached out to Clinic for more help. Front office Baird Lyons stated that the PA is out of office and is the only one that can submit the request. Advised Melissa with the update and she advised she will let the Patient know that they will have to reschedule the appointment.

## 2023-06-07 NOTE — Telephone Encounter (Signed)
Patient called stating she has a mammogram scheduled 06/08/23 and will need orders placed before the appointment. Patient was told pcp is not in office. Please advise.

## 2023-06-07 NOTE — Telephone Encounter (Signed)
Message sent regarding this in separate message.

## 2023-06-07 NOTE — Telephone Encounter (Signed)
Copied from CRM [id number]. Topic: Clinical - Request for Lab/Test Order >> Jun 07, 2023 11:53 AM Clayton Bibles wrote: Reason for CRM:  Imaging calling on behalf of patient, needs signed order for Mammogram scheduled for 12/27 at 8 AM. Please fax order to [phone]. Questions please call [phone]

## 2023-06-07 NOTE — Telephone Encounter (Signed)
Copied from CRM [id number]. Topic: Clinical - Request for Lab/Test Order >> Jun 05, 2023  9:38 AM Desma Mcgregor wrote: Reason for CRM: Chela checking the sts of the order for the diagnostic mammogram and ultrasound, bilateral. Please f/u

## 2023-06-07 NOTE — Telephone Encounter (Signed)
Spoke with [facility] -  Was told that the orders have been signed by "cronin" and patient can keep appt schld tomorrow.

## 2023-06-07 NOTE — Telephone Encounter (Signed)
Patient states that she was told that we had been giving the [facility]  the "run around" states the orders for the diagnostic mmg and [place] was not signed.  She states that the radiology  has resolved this for now so she could proceed with her appt - but  something may be needed by Tandy Gaw , PA regarding this in the future.  Not sure exactly what transpired here but apologized to patient  for her inconvenience.

## 2023-06-08 NOTE — Telephone Encounter (Signed)
Per vanicia hernandez - this has been faxed today

## 2023-06-08 NOTE — Telephone Encounter (Signed)
Form was signed by provider on 06/04/23 - faxed by vanicia hernandez today.

## 2023-06-25 ENCOUNTER — Encounter: Payer: Self-pay | Admitting: Family Medicine

## 2023-06-25 ENCOUNTER — Ambulatory Visit (INDEPENDENT_AMBULATORY_CARE_PROVIDER_SITE_OTHER): Payer: PRIVATE HEALTH INSURANCE | Admitting: Family Medicine

## 2023-06-25 VITALS — BP 123/71 | HR 100 | Temp 99.6°F | Ht 64.0 in | Wt 210.0 lb

## 2023-06-25 DIAGNOSIS — R509 Fever, unspecified: Secondary | ICD-10-CM | POA: Diagnosis not present

## 2023-06-25 DIAGNOSIS — J101 Influenza due to other identified influenza virus with other respiratory manifestations: Secondary | ICD-10-CM | POA: Diagnosis not present

## 2023-06-25 LAB — POCT INFLUENZA A/B
Influenza A, POC: POSITIVE — AB
Influenza B, POC: NEGATIVE

## 2023-06-25 LAB — POC COVID19 BINAXNOW: SARS Coronavirus 2 Ag: NEGATIVE

## 2023-06-25 MED ORDER — OSELTAMIVIR PHOSPHATE 75 MG PO CAPS: 75.0000 mg | ORAL_CAPSULE | Freq: Two times a day (BID) | ORAL | 0 refills | Status: DC

## 2023-06-25 MED ORDER — HYDROCODONE BIT-HOMATROP MBR 5-1.5 MG/5ML PO SOLN: 5.0000 mL | Freq: Every evening | ORAL | 0 refills | Status: DC | PRN

## 2023-06-25 NOTE — Addendum Note (Signed)
 Addended by: Deno Etienne on: 06/25/2023 11:51 AM   Modules accepted: Orders

## 2023-06-25 NOTE — Addendum Note (Signed)
 Addended by: Nani Gasser D on: 06/25/2023 12:00 PM   Modules accepted: Orders

## 2023-06-25 NOTE — Progress Notes (Addendum)
   Acute Office Visit  Subjective:     Patient ID: Stacie Cardenas, female    DOB: 1980-11-11, 43 y.o.   MRN: [id number]  Chief Complaint  Patient presents with   Fever   Chills    HPI Patient is in today for congestion, fever and chills that started on Friday approximately 3 and half days ago.  Using Tylenol  and  DayQuil.  Highest fever was 101.6. cough is sever.    ROS      Objective:    BP 123/71   Pulse 100   Temp 99.6 F (37.6 C) (Oral)   Ht 5' 4 (1.626 m)   Wt 210 lb (95.3 kg)   SpO2 100%   BMI 36.05 kg/m    Physical Exam Constitutional:      Appearance: Normal appearance.  HENT:     Head: Normocephalic and atraumatic.     Right Ear: Tympanic membrane, ear canal and external ear normal. There is no impacted cerumen.     Left Ear: Tympanic membrane, ear canal and external ear normal. There is no impacted cerumen.     Nose: Nose normal.     Mouth/Throat:     Pharynx: Oropharynx is clear.  Eyes:     Conjunctiva/sclera: Conjunctivae normal.  Cardiovascular:     Rate and Rhythm: Normal rate and regular rhythm.  Pulmonary:     Effort: Pulmonary effort is normal.     Breath sounds: Normal breath sounds.  Musculoskeletal:     Cervical back: Neck supple. No tenderness.  Lymphadenopathy:     Cervical: No cervical adenopathy.  Skin:    General: Skin is warm and dry.  Neurological:     Mental Status: She is alert and oriented to person, place, and time.  Psychiatric:        Mood and Affect: Mood normal.     Results for orders placed or performed in visit on 06/25/23  POC COVID-19  Result Value Ref Range   SARS Coronavirus 2 Ag Negative Negative  POCT Influenza A/B  Result Value Ref Range   Influenza A, POC Positive (A) Negative   Influenza B, POC Negative Negative        Assessment & Plan:   Problem List Items Addressed This Visit   None Visit Diagnoses       Fever, unspecified fever cause    -  Primary   Relevant Orders   POC COVID-19  (Completed)   POCT Influenza A/B (Completed)     Influenza A       Relevant Medications   oseltamivir  (TAMIFLU ) 75 MG capsule       Meds ordered this encounter  Medications   oseltamivir  (TAMIFLU ) 75 MG capsule    Sig: Take 1 capsule (75 mg total) by mouth 2 (two) times daily.    Dispense:  10 capsule    Refill:  0   HYDROcodone  bit-homatropine (HYCODAN) 5-1.5 MG/5ML syrup    Sig: Take 5 mLs by mouth at bedtime as needed for cough.    Dispense:  75 mL    Refill:  0    Return if symptoms worsen or fail to improve.  Dorothyann Byars, MD

## 2023-06-26 ENCOUNTER — Encounter: Payer: Self-pay | Admitting: Family

## 2023-07-19 ENCOUNTER — Ambulatory Visit: Payer: Self-pay | Admitting: Physician Assistant

## 2023-07-19 NOTE — Telephone Encounter (Signed)
 Copied from CRM [id number]. Topic: Clinical - Red Word Triage >> Jul 19, 2023  2:11 PM Laurier C wrote: Red Word that prompted transfer to Nurse Triage: Patient is experiencing left leg pain that is radiating throughput her leg. Pain started yesterday   Chief Complaint: Left leg pain Symptoms: Pain in left leg, numbness to bottom of left foot Frequency: Constant  Pertinent Negatives: Patient denies back pain Disposition: [] ED /[] Urgent Care (no appt availability in office) / [x] Appointment(In office/virtual)/ []  [facility]/ [] Home Care/ [] Refused Recommended Disposition /[] [facility]/ []  Follow-up with PCP Additional Notes: Patient states that yesterday she began to experience pain radiating down the back of her left leg. She states that her pain has been constant since it began, and has improved slightly with medication and a heating pad. She denies any back pain at this time but states she did have lower back pain yesterday. Patient states the bottom of her foot also feels numb. She denies any other symptom at this time. Appointment made for the patient tomorrow.    Reason for Disposition  Numbness in a leg or foot (i.e., loss of sensation)  Answer Assessment - Initial Assessment Questions 1. ONSET: When did the pain start?      Yesterday  2. LOCATION: Where is the pain located?      Left leg  3. PAIN: How bad is the pain?    (Scale 1-10; or mild, moderate, severe)   -  MILD (1-3): doesn't interfere with normal activities    -  MODERATE (4-7): interferes with normal activities (e.g., work or school) or awakens from sleep, limping    -  SEVERE (8-10): excruciating pain, unable to do any normal activities, unable to walk     8/10 4. WORK OR EXERCISE: Has there been any recent work or exercise that involved this part of the body?      No 5. CAUSE: What do you think is causing the leg pain?     Unsure  6. OTHER SYMPTOMS: Do you have any other symptoms?  (e.g., chest pain, back pain, breathing difficulty, swelling, rash, fever, numbness, weakness)     Numbness to bottom of foot  7. PREGNANCY: Is there any chance you are pregnant? When was your last menstrual period?     No  Protocols used: Leg Pain-A-AH

## 2023-07-20 ENCOUNTER — Encounter: Payer: Self-pay | Admitting: Physician Assistant

## 2023-07-20 ENCOUNTER — Ambulatory Visit (INDEPENDENT_AMBULATORY_CARE_PROVIDER_SITE_OTHER): Payer: PRIVATE HEALTH INSURANCE

## 2023-07-20 ENCOUNTER — Encounter: Payer: Self-pay | Admitting: Family

## 2023-07-20 ENCOUNTER — Ambulatory Visit (INDEPENDENT_AMBULATORY_CARE_PROVIDER_SITE_OTHER): Payer: PRIVATE HEALTH INSURANCE | Admitting: Physician Assistant

## 2023-07-20 ENCOUNTER — Other Ambulatory Visit: Payer: Self-pay | Admitting: Physician Assistant

## 2023-07-20 VITALS — BP 121/78 | HR 88 | Ht 64.0 in | Wt 210.3 lb

## 2023-07-20 DIAGNOSIS — M545 Low back pain, unspecified: Secondary | ICD-10-CM

## 2023-07-20 DIAGNOSIS — M5442 Lumbago with sciatica, left side: Secondary | ICD-10-CM | POA: Diagnosis not present

## 2023-07-20 DIAGNOSIS — E118 Type 2 diabetes mellitus with unspecified complications: Secondary | ICD-10-CM

## 2023-07-20 MED ORDER — METHOCARBAMOL 500 MG PO TABS: 500.0000 mg | ORAL_TABLET | Freq: Three times a day (TID) | ORAL | 0 refills | Status: AC

## 2023-07-20 MED ORDER — PREDNISONE 50 MG PO TABS: ORAL_TABLET | ORAL | 0 refills | Status: DC

## 2023-07-20 MED ORDER — KETOROLAC TROMETHAMINE 60 MG/2ML IM SOLN
60.0000 mg | Freq: Once | INTRAMUSCULAR | Status: AC
  Administered 2023-07-20: 60 mg via INTRAMUSCULAR

## 2023-07-20 MED ORDER — HYDROCODONE-ACETAMINOPHEN 5-325 MG PO TABS: 1.0000 | ORAL_TABLET | Freq: Three times a day (TID) | ORAL | 0 refills | Status: AC | PRN

## 2023-07-20 MED ORDER — AMBULATORY NON FORMULARY MEDICATION: 11 refills | Status: AC

## 2023-07-20 NOTE — Progress Notes (Signed)
 Acute Office Visit  Subjective:     Patient ID: Stacie Cardenas, female    DOB: 1981-02-07, 43 y.o.   MRN: [id number]  Chief Complaint  Patient presents with   Leg Pain   Knee Pain    HPI Patient is in today for left sided low back pain that radiates down back of left leg into knee. She has never had anything quite like this before. No known injury. She woke up like this about 3 days ago. Pain was terrible at first and radiated down back of left leg into left posterior knee. It was hard for her to even walk. She had her husband massage and put voltaren  gel on left side and did help. She denies any bowel or bladder dysfunctions, saddle anesthesia or leg weakness.  .. Active Ambulatory Problems    Diagnosis Date Noted   Dyslipidemia, goal LDL below 70 04/27/2010   Iron deficiency anemia 01/21/2010   HYPERTENSION, BENIGN ESSENTIAL 04/26/2006   WEIGHT GAIN 11/13/2008   Migraine headache 06/28/2010   Menorrhagia 07/03/2013   Crohn's disease (HCC) 03/30/2014   Controlled type 2 diabetes mellitus with other specified complication, without long-term current use of insulin (HCC) 06/08/2014   Insomnia 06/20/2015   Absolute anemia 09/07/2015   Vitamin D  deficiency 10/03/2016   Morbidly obese (HCC) 10/20/2016   Chronic fatigue syndrome 02/18/2017   Chronic left-sided low back pain without sciatica 07/08/2017   Anxiety 07/08/2017   Hidradenitis suppurativa 11/09/2017   Hyperpigmentation of skin, postinflammatory 11/09/2017   Acanthosis nigricans 11/09/2017   Dyslipidemia (high LDL; low HDL) 03/20/2018   No energy 12/30/2018   Breast pain, right 12/30/2018   Abscess of left elbow 07/03/2019   Abscess 01/27/2020   COVID-19 virus infection 01/27/2020   Anemia 01/27/2020   Microalbuminuria 04/05/2021   Uncontrolled type 2 diabetes mellitus with hyperglycemia (HCC) 04/05/2021   Arthralgia 04/05/2021   Nausea 04/05/2021   Ear pressure, left 04/15/2021   Conductive hearing loss of left  ear with unrestricted hearing of right ear 04/18/2021   Acute nonintractable headache 04/18/2021   Crohn's disease of large intestine without complication (HCC) 03/30/2014   Long-term current use of high risk medication other than anticoagulant 07/05/2018   Myalgia 06/29/2021   Immunity status testing 10/04/2021   Crohn's disease of small intestine without complication (HCC) 10/04/2021   Vaccine counseling 10/04/2021   Benign breast cyst in female, right 12/30/2021   Performance anxiety 04/17/2022   Acute cystitis without hematuria 09/25/2022   Crohn's disease of both small and large intestine (HCC) 12/20/2021   Resolved Ambulatory Problems    Diagnosis Date Noted   ABSCESS OF BARTHOLINS GLAND 02/19/2009   Carbuncle and furuncle 04/26/2006   SKIN RASH 11/13/2008   SYMPTOM, FREQUENCY, URINARY 04/26/2006   Abdominal pain, epigastric 04/27/2010   Impaired fasting glucose 04/27/2010   NEOPLASM UNCERTAIN BHV OTH&UNSPEC FE GENIT ORGN 08/10/2010   Vaginal leukorrhea 08/10/2010   FOLLICULITIS 08/10/2010   SORE THROAT 01/10/2011   Abdominal pain 01/10/2011   Hyperlipidemia 10/14/2012   Obesity, unspecified 10/14/2012   Other iron deficiency anemias 03/17/2013   Elevated hemoglobin A1c 05/11/2014   Abscess of left axilla 12/31/2014   Bilateral lower abdominal pain 09/07/2015   Ruptured ear drum, right 08/29/2017   Past Medical History:  Diagnosis Date   Hypercholesterolemia    Hypertension    IBS (irritable bowel syndrome)    Other specified iron deficiency anemias 03/17/2013   Type 2 diabetes mellitus, controlled (HCC) 06/08/2014  ROS  See HPI.     Objective:    BP 121/78 (BP Location: Right Arm, Patient Position: Sitting, Cuff Size: Large)   Pulse 88   Ht 5' 4 (1.626 m)   Wt 210 lb 4.8 oz (95.4 kg)   SpO2 97%   BMI 36.10 kg/m  BP Readings from Last 3 Encounters:  07/20/23 121/78  06/25/23 123/71  05/30/23 115/79   Wt Readings from Last 3 Encounters:  07/20/23  210 lb 4.8 oz (95.4 kg)  06/25/23 210 lb (95.3 kg)  05/30/23 211 lb 6.4 oz (95.9 kg)      Physical Exam Constitutional:      Appearance: Normal appearance. She is obese.  HENT:     Head: Normocephalic.  Cardiovascular:     Rate and Rhythm: Normal rate.  Pulmonary:     Effort: Pulmonary effort is normal.  Musculoskeletal:     Right lower leg: No edema.     Left lower leg: No edema.     Comments: No tenderness over lumbar spine to palpation Tenderness and tightness paraspinal to the left and into the buttocks Strength lower extremity 5/5 Negative SLR bilaterally   Neurological:     Mental Status: She is alert.         Assessment & Plan:  Stacie Cardenas was seen today for leg pain and knee pain.  Diagnoses and all orders for this visit:  Acute left-sided back pain with sciatica -     DG Lumbar Spine Complete; Future -     predniSONE  (DELTASONE ) 50 MG tablet; Take one tablet daily for 5 days. -     methocarbamol  (ROBAXIN ) 500 MG tablet; Take 1 tablet (500 mg total) by mouth 3 (three) times daily. -     HYDROcodone -acetaminophen  (NORCO/VICODIN) 5-325 MG tablet; Take 1 tablet by mouth every 8 (eight) hours as needed for up to 5 days for moderate pain (pain score 4-6). -     AMBULATORY NON FORMULARY MEDICATION; Deep Tissue massages twice a month for low back pain with sciatica. -     ketorolac  (TORADOL ) injection 60 mg   No red flags today for back pain Would like to get lumbar xray for baseline Likely some lumbar DDD with muscle spasm and some piriformis involvement Toradol  60mg  IM given today Start prednisone  for 5 days Robaxin  as needed Norco as needed, small quantity Rx for massage 2 times a month HO with stretches and exercises Follow up if not improving or if symptoms worsening   Vermell Bologna, PA-C

## 2023-07-20 NOTE — Patient Instructions (Signed)
 Piriformis Syndrome Rehab Ask your health care provider which exercises are safe for you. Do exercises exactly as told by your health care provider and adjust them as directed. It is normal to feel mild stretching, pulling, tightness, or discomfort as you do these exercises. Stop right away if you feel sudden pain or your pain gets worse. Do not begin these exercises until told by your health care provider. Stretching and range-of-motion exercises These exercises warm up your muscles and joints and improve the movement and flexibility of your hip and pelvis. The exercises also help to relieve pain, numbness, and tingling. Nerve root  Sit on a firm surface that is high enough that you can swing your left / right foot freely. Place a folded towel under your left / right thigh. This is optional. Drop your head forward and round your back. While you keep your left / right foot relaxed, slowly straighten your left / right knee until you feel a slight pull behind your knee or calf. If your leg is fully extended and you still do not feel a pull, slowly tilt your foot and toes toward you. Hold this position for __________ seconds. Slowly return your knee to its starting position. Hip rotation This is an exercise in which you lie on your back and stretch the muscles that rotate your hip (hip rotators) to stretch your buttocks. Lie on your back on a firm surface. Pull your left / right knee toward your same shoulder with your left / right hand until your knee is pointing toward the ceiling. Hold your left / right ankle with your other hand. Keeping your knee steady, gently pull your left / right ankle toward your other shoulder until you feel a stretch in your buttocks. Hold this position for __________ seconds. Repeat __________ times. Complete this exercise __________ times a day. Hip extensor This is an exercise in which you lie on your back and pull your knee to your chest. Lie on your back on a firm  surface. Both of your legs should be straight. Pull your left / right knee to your chest. Hold your leg in this position by holding on to the back of your thigh or the front of your knee. Hold this position for __________ seconds. Slowly return to the starting position. Repeat __________ times. Complete this exercise __________ times a day. Strengthening exercises These exercises build strength and endurance in your hip and thigh muscles. Endurance is the ability to use your muscles for a long time, even after they get tired. Straight leg raises, side-lying This exercise strengthens the muscles that rotate the leg at the hip and move it away from your body (hip abductors). Lie on your side with your left / right leg in the top position. Lie so your head, shoulder, knee, and hip line up. Bend your bottom knee to help you balance. Lift your top leg 4-6 inches (10-15 cm) while keeping your toes pointed straight ahead. Hold this position for __________ seconds. Slowly lower your leg to the starting position. Let your muscles relax completely after each repetition. Repeat __________ times. Complete this exercise __________ times a day. Hip abduction and rotation This is sometimes called quadruped (on hands and knees) exercises. Get on your hands and knees on a firm, lightly padded surface. Your hands should be directly below your shoulders, and your knees should be directly below your hips. Lift your left / right knee out to the side. Keep your knee bent. Do not twist  your body. Hold this position for __________ seconds. Slowly lower your leg. Repeat __________ times. Complete this exercise __________ times a day. Straight leg raises, prone This exercise stretches the muscles that move the hips (hip extensors). Lie on your abdomen on a firm surface (prone position). Tense the muscles in your buttocks and lift your left / right leg about 4 inches (10 cm). Keep your knee straight as you lift your  leg. If you cannot lift your leg that high without arching your back, place a pillow under your hips. Hold this position for __________ seconds. Slowly lower your leg to the starting position. Let your muscles relax completely after each repetition. Repeat __________ times. Complete this exercise __________ times a day. This information is not intended to replace advice given to you by your health care provider. Make sure you discuss any questions you have with your health care provider. Document Revised: 11/30/2020 Document Reviewed: 11/30/2020 Elsevier Patient Education  2024 [employer].

## 2023-07-23 ENCOUNTER — Encounter: Payer: Self-pay | Admitting: Physician Assistant

## 2023-07-23 NOTE — Progress Notes (Signed)
 Favor,   No evidence of arthritis in spine. Your back pain is likely muscular related and treatment plan stays the same. Massage and exercises can really help this!!

## 2023-07-24 MED ORDER — MOUNJARO 7.5 MG/0.5ML ~~LOC~~ SOAJ: 7.5000 mg | SUBCUTANEOUS | 0 refills | Status: DC

## 2023-07-24 NOTE — Addendum Note (Signed)
Addended by: Jomarie Longs on: 07/24/2023 06:46 AM   Modules accepted: Orders

## 2023-09-04 ENCOUNTER — Other Ambulatory Visit: Payer: Self-pay | Admitting: Physician Assistant

## 2023-09-04 DIAGNOSIS — I10 Essential (primary) hypertension: Secondary | ICD-10-CM

## 2023-10-02 ENCOUNTER — Encounter: Payer: Self-pay | Admitting: Physician Assistant

## 2023-10-02 ENCOUNTER — Encounter: Payer: Self-pay | Admitting: Family

## 2023-10-02 ENCOUNTER — Ambulatory Visit (INDEPENDENT_AMBULATORY_CARE_PROVIDER_SITE_OTHER): Payer: PRIVATE HEALTH INSURANCE | Admitting: Physician Assistant

## 2023-10-02 VITALS — BP 117/82 | HR 84 | Ht 64.0 in | Wt 210.0 lb

## 2023-10-02 DIAGNOSIS — Z1329 Encounter for screening for other suspected endocrine disorder: Secondary | ICD-10-CM

## 2023-10-02 DIAGNOSIS — F419 Anxiety disorder, unspecified: Secondary | ICD-10-CM

## 2023-10-02 DIAGNOSIS — G9332 Myalgic encephalomyelitis/chronic fatigue syndrome: Secondary | ICD-10-CM

## 2023-10-02 DIAGNOSIS — Z7985 Long-term (current) use of injectable non-insulin antidiabetic drugs: Secondary | ICD-10-CM

## 2023-10-02 DIAGNOSIS — E1169 Type 2 diabetes mellitus with other specified complication: Secondary | ICD-10-CM

## 2023-10-02 DIAGNOSIS — E785 Hyperlipidemia, unspecified: Secondary | ICD-10-CM

## 2023-10-02 DIAGNOSIS — K5000 Crohn's disease of small intestine without complications: Secondary | ICD-10-CM

## 2023-10-02 DIAGNOSIS — Z111 Encounter for screening for respiratory tuberculosis: Secondary | ICD-10-CM

## 2023-10-02 DIAGNOSIS — I10 Essential (primary) hypertension: Secondary | ICD-10-CM | POA: Diagnosis not present

## 2023-10-02 LAB — POCT GLYCOSYLATED HEMOGLOBIN (HGB A1C): Hemoglobin A1C: 5.8 % — AB (ref 4.0–5.6)

## 2023-10-02 LAB — POCT UA - MICROALBUMIN
Albumin/Creatinine Ratio, Urine, POC: <30
Creatinine, POC: 10 mg/dL
Microalbumin Ur, POC: 10 mg/L

## 2023-10-02 NOTE — Progress Notes (Signed)
 Established Patient Office Visit  Subjective   Patient ID: Stacie Cardenas, female    DOB: 1980/10/09  Age: 43 y.o. MRN: [id number]  Chief Complaint  Patient presents with   Medical Management of Chronic Issues    HPI Pt is a 43 yo obese female with T2DM, HTN, Crohn's, CFS, Anxiety who presents to the clinic for follow up.   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   She has no concerns today. She is compliant with medications. She is active and happy. She is not checking sugars regularly. She denies any known hypoglycemic events. She is not having any CP,SOB, palpitations. She has not had any recent Crohn's flares as well.    Patient Active Problem List   Diagnosis Date Noted   Performance anxiety 04/17/2022   Benign breast cyst in female, right 12/30/2021   Crohn's disease of both small and large intestine (HCC) 12/20/2021   Crohn's disease of small intestine without complication (HCC) 10/04/2021   Myalgia 06/29/2021   Microalbuminuria 04/05/2021   Long-term current use of high risk medication other than anticoagulant 07/05/2018   Dyslipidemia (high LDL; low HDL) 03/20/2018   Hidradenitis suppurativa 11/09/2017   Hyperpigmentation of skin, postinflammatory 11/09/2017   Acanthosis nigricans 11/09/2017   Chronic left-sided low back pain without sciatica 07/08/2017   Anxiety 07/08/2017   Chronic fatigue syndrome 02/18/2017   Vitamin D  deficiency 10/03/2016   Absolute anemia 09/07/2015   Insomnia 06/20/2015   Controlled type 2 diabetes mellitus with other specified complication, without long-term current use of insulin (HCC) 06/08/2014   Crohn's disease (HCC) 03/30/2014   Crohn's disease of large intestine without complication (HCC) 03/30/2014   Menorrhagia 07/03/2013   Migraine headache 06/28/2010   Dyslipidemia, goal LDL below 70 04/27/2010   Iron deficiency anemia 01/21/2010   HYPERTENSION, BENIGN ESSENTIAL 04/26/2006   Past Medical  History:  Diagnosis Date   Crohn's disease (HCC) 03/30/2014   Biopsies done 03/23/14.  Digestive health management.     Hypercholesterolemia    Hypertension    IBS (irritable bowel syndrome)    Other specified iron deficiency anemias 03/17/2013   Type 2 diabetes mellitus, controlled (HCC) 06/08/2014   Past Surgical History:  Procedure Laterality Date   CESAREAN SECTION     History reviewed. No pertinent family history. Allergies  Allergen Reactions   Metformin  Other (See Comments)    Vaginal yeast infections   Ampicillin Diarrhea   Doxycycline      Nausea/vomiting   Rosuvastatin  Other (See Comments)   Xigduo  Xr [Dapagliflozin  Pro-Metformin  Er]     Yeast infections.       Review of Systems  All other systems reviewed and are negative.     Objective:     BP 117/82   Pulse 84   Ht 5\' 4"  (1.626 m)   Wt 210 lb (95.3 kg)   SpO2 99%   BMI 36.05 kg/m  BP Readings from Last 3 Encounters:  10/02/23 117/82  07/20/23 121/78  06/25/23 123/71   Wt Readings from Last 3 Encounters:  10/02/23 210 lb (95.3 kg)  07/20/23 210 lb 4.8 oz (95.4 kg)  06/25/23 210 lb (95.3 kg)    .Stacie Cardenas Results for orders placed or performed in visit on 10/02/23  POCT UA - Microalbumin   Collection Time: 10/02/23  4:01 PM  Result Value Ref Range   Microalbumin Ur, POC 10 mg/L   Creatinine, POC 10 mg/dL   Albumin/Creatinine Ratio, Urine,  POC <30   POCT HgB A1C   Collection Time: 10/02/23  4:02 PM  Result Value Ref Range   Hemoglobin A1C 5.8 (A) 4.0 - 5.6 %   HbA1c POC (<> result, manual entry)     HbA1c, POC (prediabetic range)     HbA1c, POC (controlled diabetic range)       Physical Exam Constitutional:      Appearance: Normal appearance. She is obese.  HENT:     Head: Normocephalic.  Cardiovascular:     Rate and Rhythm: Normal rate and regular rhythm.  Pulmonary:     Effort: Pulmonary effort is normal.     Breath sounds: Normal breath sounds.  Abdominal:     General: There is no  distension.     Palpations: Abdomen is soft. There is no mass.     Tenderness: There is no abdominal tenderness. There is no right CVA tenderness, left CVA tenderness, guarding or rebound.  Musculoskeletal:     Right lower leg: No edema.     Left lower leg: No edema.  Neurological:     General: No focal deficit present.     Mental Status: She is alert and oriented to person, place, and time.  Psychiatric:        Mood and Affect: Mood normal.       The 10-year ASCVD risk score (Arnett DK, et al., 2019) is: 2.8%    Assessment & Plan:  Stacie AasAaron AasBabbette was seen today for medical management of chronic issues.  Diagnoses and all orders for this visit:  Controlled type 2 diabetes mellitus with other specified complication, without long-term current use of insulin (HCC) -     POCT HgB A1C -     POCT UA - Microalbumin  Dyslipidemia, goal LDL below 70  HYPERTENSION, BENIGN ESSENTIAL -     TSH  Crohn's disease of small intestine without complication (HCC) -     TSH  Chronic fatigue syndrome -     TSH  Anxiety  Screening-pulmonary TB -     QFT-TB Plus (Client Incubated)  Thyroid disorder screen -     TSH   A1C to goal Continue same medications(mounjaro  and jardiance ) BP to goal-continue losartan  Normal microalbumin Does not tolerate statin on repatha  Eye and foot exam UTD Vaccines UTD Follow up in 3 months  TB screening ordered for school  Crohn's managed by GI.     Return in about 3 months (around 01/01/2024).    Alyssa Mancera, PA-C

## 2023-10-03 LAB — TSH: TSH: 1.59 u[IU]/mL (ref 0.450–4.500)

## 2023-10-05 ENCOUNTER — Encounter: Payer: Self-pay | Admitting: Physician Assistant

## 2023-10-05 LAB — QFT-TB PLUS (CLIENT INCUBATED)
QuantiFERON Mitogen Value: 1.2 [IU]/mL
QuantiFERON Nil Value: 0.06 [IU]/mL
QuantiFERON TB1 Ag Value: 0.06 [IU]/mL
QuantiFERON TB2 Ag Value: 0.06 [IU]/mL
QuantiFERON-TB Gold Plus: NEGATIVE

## 2023-10-05 NOTE — Progress Notes (Signed)
 Negative TB screening

## 2023-10-09 ENCOUNTER — Ambulatory Visit: Payer: PRIVATE HEALTH INSURANCE | Admitting: Physician Assistant

## 2023-12-10 ENCOUNTER — Other Ambulatory Visit: Payer: Self-pay | Admitting: Physician Assistant

## 2023-12-10 DIAGNOSIS — F418 Other specified anxiety disorders: Secondary | ICD-10-CM

## 2023-12-10 MED ORDER — PROPRANOLOL HCL 10 MG PO TABS: ORAL_TABLET | ORAL | 1 refills | Status: AC

## 2023-12-10 NOTE — Telephone Encounter (Signed)
 Copied from CRM [id number]. Topic: Clinical - Medication Refill >> Dec 10, 2023  1:52 PM Merlynn A wrote: Medication: propranolol  (INDERAL ) 10 MG tablet  Has the patient contacted their pharmacy? Yes (Agent: If no, request that the patient contact the pharmacy for the refill. If patient does not wish to contact the pharmacy document the reason why and proceed with request.) (Agent: If yes, when and what did the pharmacy advise?)  Patient called pharmacy and was advised that a new prescription was needed. Please refill for patient.   This is the patient's preferred pharmacy:  [facility] - [address], [facility] [place] [address] Phone: [phone] Fax: [phone]  Is this the correct pharmacy for this prescription? Yes If no, delete pharmacy and type the correct one.   Has the prescription been filled recently? No  Is the patient out of the medication? Yes  Has the patient been seen for an appointment in the last year OR does the patient have an upcoming appointment? Yes  Can we respond through MyChart? Yes  Agent: Please be advised that Rx refills may take up to 3 business days. We ask that you follow-up with your pharmacy.

## 2024-02-06 ENCOUNTER — Other Ambulatory Visit: Payer: Self-pay | Admitting: Physician Assistant

## 2024-02-06 DIAGNOSIS — E118 Type 2 diabetes mellitus with unspecified complications: Secondary | ICD-10-CM

## 2024-02-18 ENCOUNTER — Ambulatory Visit (INDEPENDENT_AMBULATORY_CARE_PROVIDER_SITE_OTHER): Payer: PRIVATE HEALTH INSURANCE | Admitting: Physician Assistant

## 2024-02-18 VITALS — BP 138/81 | HR 75 | Ht 64.0 in | Wt 210.0 lb

## 2024-02-18 DIAGNOSIS — E785 Hyperlipidemia, unspecified: Secondary | ICD-10-CM | POA: Diagnosis not present

## 2024-02-18 DIAGNOSIS — Z7985 Long-term (current) use of injectable non-insulin antidiabetic drugs: Secondary | ICD-10-CM

## 2024-02-18 DIAGNOSIS — G44201 Tension-type headache, unspecified, intractable: Secondary | ICD-10-CM | POA: Insufficient documentation

## 2024-02-18 DIAGNOSIS — I10 Essential (primary) hypertension: Secondary | ICD-10-CM

## 2024-02-18 DIAGNOSIS — E118 Type 2 diabetes mellitus with unspecified complications: Secondary | ICD-10-CM

## 2024-02-18 DIAGNOSIS — E1169 Type 2 diabetes mellitus with other specified complication: Secondary | ICD-10-CM | POA: Diagnosis not present

## 2024-02-18 LAB — POCT GLYCOSYLATED HEMOGLOBIN (HGB A1C): Hemoglobin A1C: 5.7 % — AB (ref 4.0–5.6)

## 2024-02-18 MED ORDER — MOUNJARO 7.5 MG/0.5ML ~~LOC~~ SOAJ: 7.5000 mg | SUBCUTANEOUS | 0 refills | Status: AC

## 2024-02-18 MED ORDER — DEXAMETHASONE SODIUM PHOSPHATE 10 MG/ML IJ SOLN
10.0000 mg | Freq: Once | INTRAMUSCULAR | Status: AC
  Administered 2024-02-18: 10 mg via INTRAMUSCULAR

## 2024-02-18 MED ORDER — SUMATRIPTAN SUCCINATE 50 MG PO TABS: 50.0000 mg | ORAL_TABLET | ORAL | 0 refills | Status: AC | PRN

## 2024-02-18 MED ORDER — EMPAGLIFLOZIN 10 MG PO TABS: 10.0000 mg | ORAL_TABLET | Freq: Every day | ORAL | 0 refills | Status: AC

## 2024-02-18 MED ORDER — KETOROLAC TROMETHAMINE 60 MG/2ML IM SOLN
60.0000 mg | Freq: Once | INTRAMUSCULAR | Status: AC
  Administered 2024-02-18: 60 mg via INTRAMUSCULAR

## 2024-02-19 ENCOUNTER — Encounter: Payer: Self-pay | Admitting: Physician Assistant

## 2024-02-19 NOTE — Progress Notes (Signed)
 Established Patient Office Visit  Subjective   Patient ID: Stacie Cardenas, female    DOB: [birth year]  Age: 43 y.o. MRN: [id number]  Chief Complaint  Patient presents with   Medical Management of Chronic Issues    HPI Pt is a 43 yo obese female with T2DM, HTN, HLD, Crohns who presents to the clinic for 3 month follow up.   Pt is not checking sugars. She is compliant with medications. She denies any hypoglycemic events. She is active but no exercise. She is not on any diet. Overall she feels good. She denies any CP, palpitations, dizziness, or vision changes.   She has had a HA for 2 days that will not go away. She denies any sinus pressure, ST, ear pain. She has taken tylenol  without any benefit. She is sound and light sensitive. It has been hard to work. Seems to start in her neck and then frontal squeezing. No vision or speech changes. No nausea or vomiting.    ROS See HPI.    Objective:     BP 138/81   Pulse 75   Ht 5' 4 (1.626 m)   Wt 210 lb (95.3 kg)   SpO2 99%   BMI 36.05 kg/m  BP Readings from Last 3 Encounters:  02/18/24 138/81  10/02/23 117/82  07/20/23 121/78   Wt Readings from Last 3 Encounters:  02/18/24 210 lb (95.3 kg)  10/02/23 210 lb (95.3 kg)  07/20/23 210 lb 4.8 oz (95.4 kg)      Physical Exam Constitutional:      Appearance: Normal appearance. She is obese.  HENT:     Head: Normocephalic.  Eyes:     General:        Right eye: No discharge.        Left eye: No discharge.     Extraocular Movements: Extraocular movements intact.     Conjunctiva/sclera: Conjunctivae normal.     Pupils: Pupils are equal, round, and reactive to light.  Cardiovascular:     Rate and Rhythm: Normal rate and regular rhythm.  Pulmonary:     Effort: Pulmonary effort is normal.     Breath sounds: Normal breath sounds.  Musculoskeletal:     Cervical back: Normal range of motion and neck supple.     Right lower leg: No edema.     Left lower leg: No edema.   Neurological:     Mental Status: She is alert and oriented to person, place, and time.  Psychiatric:        Mood and Affect: Mood normal.      Results for orders placed or performed in visit on 02/18/24  POCT HgB A1C  Result Value Ref Range   Hemoglobin A1C 5.7 (A) 4.0 - 5.6 %   HbA1c POC (<> result, manual entry)     HbA1c, POC (prediabetic range)     HbA1c, POC (controlled diabetic range)       The 10-year ASCVD risk score (Arnett DK, et al., 2019) is: 7.2%    Assessment & Plan:  Stacie Cardenas was seen today for medical management of chronic issues.  Diagnoses and all orders for this visit:  Hyperlipidemia associated with type 2 diabetes mellitus (HCC) -     POCT HgB A1C -     empagliflozin  (JARDIANCE ) 10 MG TABS tablet; Take 1 tablet (10 mg total) by mouth daily. -     tirzepatide  (MOUNJARO ) 7.5 MG/0.5ML Pen; Inject 7.5 mg into the skin once a week. Inject  contents of 1 syringe (7.5 mg) into the skin once a week.  HYPERTENSION, BENIGN ESSENTIAL  Acute intractable tension-type headache -     SUMAtriptan  (IMITREX ) 50 MG tablet; Take 1 tablet (50 mg total) by mouth every 2 (two) hours as needed for migraine. May repeat in 2 hours if headache persists or recurs. -     ketorolac  (TORADOL ) injection 60 mg -     dexamethasone  (DECADRON ) injection 10 mg   A1C to goal at 5.7 Stay on same medications, refills sent today Continue with DM diet and regular exercise BP to goal On statin Foot and eye exam UTD Will get flu shot at work Follow up in 3 months  Discussed acute HA No red flag signs or symptoms Immitrex refilled to use for rescue as needed  Toradol /decadron  given IM today  Return in about 3 months (around 05/19/2024).    Donnel Venuto, PA-C

## 2024-03-28 ENCOUNTER — Telehealth: Payer: Self-pay

## 2024-03-28 ENCOUNTER — Other Ambulatory Visit: Payer: Self-pay | Admitting: Physician Assistant

## 2024-03-28 DIAGNOSIS — I10 Essential (primary) hypertension: Secondary | ICD-10-CM

## 2024-03-28 DIAGNOSIS — R110 Nausea: Secondary | ICD-10-CM

## 2024-03-28 NOTE — Telephone Encounter (Signed)
 See refill request.

## 2024-03-28 NOTE — Telephone Encounter (Signed)
 Copied from CRM #[id number]. Topic: Clinical - Medication Question >> Mar 28, 2024 10:32 AM Mia F wrote: Reason for CRM: Pt pharmacy called in a rx refill for pt and was told to follow up with office. Request was put in this morning. Pt was advise it could take up to 3 business days for refill requests. She says she is out but understands the rx procedure.   losartan  (COZAAR ) 50 MG tablet ondansetron  (ZOFRAN -ODT) 8 MG disintegrating tablet

## 2024-04-24 ENCOUNTER — Telehealth: Payer: Self-pay

## 2024-04-24 NOTE — Telephone Encounter (Signed)
 Attempted to contact patient in regards to scheduling an appointment for DM f/u. Unfortunately I did not receive a response. Voicemail is full; I was unable to leave a message.

## 2024-04-29 ENCOUNTER — Telehealth: Payer: Self-pay

## 2024-04-29 NOTE — Telephone Encounter (Signed)
 Stacie Cardenas is due for an annual physical and PAP on or after 05/30/2024. She states she will call back to schedule.

## 2024-05-30 ENCOUNTER — Encounter: Payer: Self-pay | Admitting: Physician Assistant

## 2024-05-30 ENCOUNTER — Other Ambulatory Visit (HOSPITAL_COMMUNITY)
Admission: RE | Admit: 2024-05-30 | Discharge: 2024-05-30 | Disposition: A | Discharge status: Home / Self Care | Payer: PRIVATE HEALTH INSURANCE | Source: Ambulatory Visit | Attending: Physician Assistant | Admitting: Physician Assistant

## 2024-05-30 ENCOUNTER — Ambulatory Visit: Payer: PRIVATE HEALTH INSURANCE | Admitting: Physician Assistant

## 2024-05-30 VITALS — BP 121/69 | HR 77 | Ht 64.0 in | Wt 210.0 lb

## 2024-05-30 DIAGNOSIS — Z7984 Long term (current) use of oral hypoglycemic drugs: Secondary | ICD-10-CM | POA: Diagnosis not present

## 2024-05-30 DIAGNOSIS — Z0000 Encounter for general adult medical examination without abnormal findings: Secondary | ICD-10-CM

## 2024-05-30 DIAGNOSIS — E559 Vitamin D deficiency, unspecified: Secondary | ICD-10-CM | POA: Diagnosis not present

## 2024-05-30 DIAGNOSIS — N6001 Solitary cyst of right breast: Secondary | ICD-10-CM

## 2024-05-30 DIAGNOSIS — E785 Hyperlipidemia, unspecified: Secondary | ICD-10-CM

## 2024-05-30 DIAGNOSIS — R809 Proteinuria, unspecified: Secondary | ICD-10-CM

## 2024-05-30 DIAGNOSIS — L723 Sebaceous cyst: Secondary | ICD-10-CM | POA: Diagnosis not present

## 2024-05-30 DIAGNOSIS — E1169 Type 2 diabetes mellitus with other specified complication: Secondary | ICD-10-CM | POA: Diagnosis not present

## 2024-05-30 NOTE — Progress Notes (Unsigned)
 "  Complete physical exam  Patient: Stacie Cardenas   DOB: January 02, 1981   43 y.o. Female  MRN: [id number]  Subjective:    Chief Complaint  Patient presents with   Annual Exam    PAP   Discussed the use of AI scribe software for clinical note transcription with the patient, who gave verbal consent to proceed.  History of Present Illness Stacie Cardenas is a 43 year old female who presents for a routine follow-up visit.  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   Benign breast cyst in female, right 12/30/2021   Crohn's disease of both small and large intestine (HCC) 12/20/2021   Crohn's disease of small intestine without complication (HCC)  10/04/2021   Myalgia 06/29/2021   Microalbuminuria 04/05/2021   Long-term current use of high risk medication other than anticoagulant 07/05/2018   Dyslipidemia (high LDL; low HDL) 03/20/2018   Hidradenitis suppurativa 11/09/2017   Hyperpigmentation of skin, postinflammatory 11/09/2017   Acanthosis nigricans 11/09/2017   Chronic left-sided low back pain without sciatica 07/08/2017   Anxiety 07/08/2017   Chronic fatigue syndrome 02/18/2017   Vitamin D  deficiency 10/03/2016   Absolute anemia 09/07/2015   Insomnia 06/20/2015   Controlled type 2 diabetes mellitus with other specified complication, without long-term current use of insulin 06/08/2014   Crohn's disease (HCC) 03/30/2014   Crohn's disease of large intestine without complication (HCC) 03/30/2014   Menorrhagia 07/03/2013   Migraine headache 06/28/2010   Hyperlipidemia associated with type 2 diabetes mellitus (HCC) 04/27/2010   Iron deficiency anemia 01/21/2010   HYPERTENSION, BENIGN ESSENTIAL 04/26/2006   Past Medical History:  Diagnosis Date   Crohn's disease (HCC) 03/30/2014   Biopsies done 03/23/14.  Digestive health management.     Hypercholesterolemia    Hypertension    IBS (irritable bowel syndrome)    Other specified iron deficiency anemias 03/17/2013   Type 2 diabetes mellitus, controlled (HCC) 06/08/2014   Past Surgical History:  Procedure Laterality Date   CESAREAN SECTION     Social History[1] History reviewed. No pertinent family history. Allergies[2]    Patient Care Team: Antasia Haider L,  PA-C as PCP - General (Family Medicine) Beverli Cara PARAS, RPH (Inactive) (Pharmacist)   Show/hide medication list[3]  ROS See HPI.     Objective:     BP 121/69 (BP Location: Right Arm, Patient Position: Sitting, Cuff Size: Large)   Pulse 77   Ht 5' 4 (1.626 m)   Wt 210 lb (95.3 kg)   SpO2 99%   BMI 36.05 kg/m  BP Readings from Last 3 Encounters:  05/30/24 121/69  02/18/24 138/81  10/02/23 117/82    Wt Readings from Last 3 Encounters:  05/30/24 210 lb (95.3 kg)  02/18/24 210 lb (95.3 kg)  10/02/23 210 lb (95.3 kg)      Physical Exam  BP 121/69 (BP Location: Right Arm, Patient Position: Sitting, Cuff Size: Large)   Pulse 77   Ht 5' 4 (1.626 m)   Wt 210 lb (95.3 kg)   SpO2 99%   BMI 36.05 kg/m   General Appearance:    Alert, cooperative, no distress, appears stated age  Head:    Normocephalic, without obvious abnormality, atraumatic  Eyes:    PERRL, conjunctiva/corneas clear, EOM's intact, fundi    benign, both eyes  Ears:    Normal TM's and external ear canals, both ears  Nose:   Nares normal, septum midline, mucosa normal, no drainage    or sinus tenderness  Throat:   Lips, mucosa, and tongue normal; teeth and gums normal  Neck:   Supple, symmetrical, trachea midline, no adenopathy;    thyroid:  no enlargement/tenderness/nodules; no carotid   bruit or JVD  Back:     Symmetric, no curvature, ROM normal, no CVA tenderness  Lungs:     Clear to auscultation bilaterally, respirations unlabored  Chest Wall:    No tenderness or deformity   Heart:    Regular rate and rhythm, S1 and S2 normal, no murmur, rub   or gallop  Breast Exam:    Right breast cyst at 12 oclock mobile nodule with some minimal tenderness to palpation.   Abdomen:     Soft, non-tender, bowel sounds active all four quadrants,    no masses, no organomegaly  Genitalia:    Normal female without discharge or tenderness. Multiple sebaceous cyst of labia majora in clusters.   Rectal:    Normal tone, normal prostate, no masses or tenderness;   guaiac negative stool  Extremities:   Extremities normal, atraumatic, no cyanosis or edema  Pulses:   2+ and symmetric all extremities  Skin:   Skin color, texture, turgor normal, no rashes or lesions  Lymph nodes:   Cervical, supraclavicular, and axillary nodes normal  Neurologic:   CNII-XII intact, normal strength, sensation and reflexes    throughout      Assessment &  Plan:    Routine Health Maintenance and Physical Exam  Immunization History  Administered Date(s) Administered   Fluzone Influenza virus vaccine,trivalent (IIV3), split virus 04/20/2014   Hepatitis A, Ped/Adol-2 Dose 02/12/2020   Hpv-Unspecified 04/26/2006   Influenza Inj Mdck Quad Pf 04/20/2014   Influenza,inj,Quad PF,6+ Mos 04/20/2014, 02/03/2015, 03/20/2018   Influenza-Unspecified 04/08/1981, 03/03/2020, 03/14/2021, 03/24/2022, 03/21/2024   Pneumococcal Conjugate-13 02/27/2019   Pneumococcal Polysaccharide-23 08/14/2014, 02/12/2020   Tdap 03/14/2012, 09/13/2021    Health Maintenance  Topic Date Due   Cervical Cancer Screening (HPV/Pap Cotest)  12/26/2021   Mammogram  05/14/2024   COVID-19 Vaccine (1) 06/15/2024 (Originally 07/09/1981)   Hepatitis B Vaccines 19-59 Average Risk (1 of 3 - 19+ 3-dose series) 05/30/2025 (Originally 01/07/2000)  HPV VACCINES (2 - Risk 3-dose series) 05/30/2025 (Originally 05/24/2006)   OPHTHALMOLOGY EXAM  06/07/2024   Diabetic kidney evaluation - Urine ACR  10/01/2024   HEMOGLOBIN A1C  11/28/2024   Diabetic kidney evaluation - eGFR measurement  05/30/2025   FOOT EXAM  05/30/2025   Pneumococcal Vaccine (3 of 3 - PCV20 or PCV21) 01/07/2031   DTaP/Tdap/Td (3 - Td or Tdap) 09/14/2031   Influenza Vaccine  Completed   Hepatitis C Screening  Completed   HIV Screening  Completed   Meningococcal B Vaccine  Aged Out    Discussed health benefits of physical activity, and encouraged her to engage in regular exercise appropriate for her age and condition. Luan was seen today for annual exam.  Diagnoses and all orders for this visit:  Encounter for annual physical examination excluding gynecological examination in a patient older than 17 years -     Cytology - PAP -     CBC with Differential/Platelet -     CMP14+EGFR -     Lipid panel -     TSH -     VITAMIN D  25 Hydroxy (Vit-D Deficiency, Fractures) -     Vitamin B12 -     Hemoglobin  A1c  Encounter for annual physical exam -     Cytology - PAP -     CBC with Differential/Platelet -     CMP14+EGFR -     Lipid panel -     TSH -     VITAMIN D  25 Hydroxy (Vit-D Deficiency, Fractures) -     Vitamin B12 -     Hemoglobin A1c  Hyperlipidemia associated with type 2 diabetes mellitus (HCC) -     Lipid panel -     Hemoglobin A1c  Vitamin D  deficiency -     VITAMIN D  25 Hydroxy (Vit-D Deficiency, Fractures)  Benign breast cyst in female, right  Microalbuminuria -     CMP14+EGFR  Sebaceous cyst of left axilla   Assessment & Plan Woman's Wellness Visit Routine wellness visit with no issues. Mammogram scheduled for December. - Ensure mammogram is completed in December. - Pap smear done today.  - Vaccines UTD.  - discussed exercise 150 minutes a week - Encouraged calcium  and vitamin D  - hx of microalbuminuria- recheck GFR today  Type 2 Diabetes Mellitus Managed with Tirzepatide  and Metformin . Awaiting A1c results. No home glucose monitoring needed. - Await A1c results. - Continue current diabetes management with Tirzepatide  and Metformin .  Hypertension No symptoms reported. - Losartan  refilled.   Breast density(right breast cyst) Monitoring every six months due to dense breast tissue. Recommendation to return annually as previous results were stable. - Continue annual mammogram monitoring.       Vermell Bologna, PA-C      [1]  Social History Tobacco Use   Smoking status: Never   Smokeless tobacco: Never   Tobacco comments:    never used tobacco  Substance Use Topics   Alcohol use: No    Alcohol/week: 0.0 standard drinks of alcohol   Drug use: No  [2]  Allergies Allergen Reactions   Metformin  Other (See Comments)    Vaginal yeast infections   Ampicillin Diarrhea   Doxycycline      Nausea/vomiting   Rosuvastatin  Other (See Comments)   Xigduo  Xr [Dapagliflozin  Pro-Metformin  Er]     Yeast infections.   [3]  Outpatient Medications Prior  to Visit  Medication Sig   AMBULATORY NON FORMULARY MEDICATION Deep Tissue massages twice a month for  low back pain with sciatica.   blood glucose meter kit and supplies KIT Dispense based on patient and insurance preference. Use up to four times daily as directed. Please include lancets, test strips, control solution.   empagliflozin  (JARDIANCE ) 10 MG TABS tablet Take 1 tablet (10 mg total) by mouth daily.   Evolocumab  (REPATHA  SURECLICK) 140 MG/ML SOAJ Inject 140 mg into the skin every 14 (fourteen) days.   fluticasone  (FLONASE ) 50 MCG/ACT nasal spray Place 2 sprays into both nostrils daily.   losartan  (COZAAR ) 50 MG tablet Take 1 tablet (50 mg total) by mouth daily.   methocarbamol  (ROBAXIN ) 500 MG tablet Take 1 tablet (500 mg total) by mouth 3 (three) times daily.   mupirocin  ointment (BACTROBAN ) 2 % Apply to affected area TID for 7 days.   ondansetron  (ZOFRAN -ODT) 8 MG disintegrating tablet Dissolve 1 tablet (8 mg total) on tongue every 8 (eight) hours as needed for nausea or vomiting.   propranolol  (INDERAL ) 10 MG tablet Take one to four tablets 30 minutes before test.   SUMAtriptan  (IMITREX ) 50 MG tablet Take 1 tablet (50 mg total) by mouth every 2 (two) hours as needed for migraine. May repeat in 2 hours if headache persists or recurs.   tirzepatide  (MOUNJARO ) 7.5 MG/0.5ML Pen Inject 7.5 mg into the skin once a week. Inject contents of 1 syringe (7.5 mg) into the skin once a week.   vedolizumab  (ENTYVIO ) 300 MG injection Inject 300 mg into the vein every 30 (thirty) days.   Vitamin D , Ergocalciferol , (DRISDOL ) 1.25 MG (50000 UNIT) CAPS capsule Take 50,000 Units by mouth once a week.   No facility-administered medications prior to visit.   "

## 2024-05-30 NOTE — Patient Instructions (Signed)

## 2024-05-31 LAB — CMP14+EGFR
ALT: 13 IU/L (ref 0–32)
AST: 13 IU/L (ref 0–40)
Albumin: 4.1 g/dL (ref 3.9–4.9)
Alkaline Phosphatase: 63 IU/L (ref 41–116)
BUN/Creatinine Ratio: 11 (ref 9–23)
BUN: 8 mg/dL (ref 6–24)
Bilirubin Total: 0.3 mg/dL (ref 0.0–1.2)
CO2: 23 mmol/L (ref 20–29)
Calcium: 9.6 mg/dL (ref 8.7–10.2)
Chloride: 103 mmol/L (ref 96–106)
Creatinine, Ser: 0.72 mg/dL (ref 0.57–1.00)
Globulin, Total: 3.1 g/dL (ref 1.5–4.5)
Glucose: 117 mg/dL — ABNORMAL HIGH (ref 70–99)
Potassium: 4.2 mmol/L (ref 3.5–5.2)
Sodium: 139 mmol/L (ref 134–144)
Total Protein: 7.2 g/dL (ref 6.0–8.5)
eGFR: 106 mL/min/1.73

## 2024-05-31 LAB — CBC WITH DIFFERENTIAL/PLATELET
Basophils Absolute: 0 x10E3/uL (ref 0.0–0.2)
Basos: 1 %
EOS (ABSOLUTE): 0.1 x10E3/uL (ref 0.0–0.4)
Eos: 2 %
Hematocrit: 41.2 % (ref 34.0–46.6)
Hemoglobin: 12.8 g/dL (ref 11.1–15.9)
Immature Grans (Abs): 0 x10E3/uL (ref 0.0–0.1)
Immature Granulocytes: 0 %
Lymphocytes Absolute: 3.3 x10E3/uL — ABNORMAL HIGH (ref 0.7–3.1)
Lymphs: 47 %
MCH: 25.1 pg — ABNORMAL LOW (ref 26.6–33.0)
MCHC: 31.1 g/dL — ABNORMAL LOW (ref 31.5–35.7)
MCV: 81 fL (ref 79–97)
Monocytes Absolute: 0.5 x10E3/uL (ref 0.1–0.9)
Monocytes: 7 %
Neutrophils Absolute: 3 x10E3/uL (ref 1.4–7.0)
Neutrophils: 43 %
Platelets: 285 x10E3/uL (ref 150–450)
RBC: 5.09 x10E6/uL (ref 3.77–5.28)
RDW: 14.2 % (ref 11.7–15.4)
WBC: 7 x10E3/uL (ref 3.4–10.8)

## 2024-05-31 LAB — LIPID PANEL
Chol/HDL Ratio: 2.4 ratio (ref 0.0–4.4)
Cholesterol, Total: 158 mg/dL (ref 100–199)
HDL: 67 mg/dL
LDL Chol Calc (NIH): 74 mg/dL (ref 0–99)
Triglycerides: 93 mg/dL (ref 0–149)
VLDL Cholesterol Cal: 17 mg/dL (ref 5–40)

## 2024-05-31 LAB — HEMOGLOBIN A1C
Est. average glucose Bld gHb Est-mCnc: 128 mg/dL
Hgb A1c MFr Bld: 6.1 % — ABNORMAL HIGH (ref 4.8–5.6)

## 2024-05-31 LAB — VITAMIN D 25 HYDROXY (VIT D DEFICIENCY, FRACTURES): Vit D, 25-Hydroxy: 33.5 ng/mL (ref 30.0–100.0)

## 2024-05-31 LAB — TSH: TSH: 1.46 u[IU]/mL (ref 0.450–4.500)

## 2024-05-31 LAB — VITAMIN B12: Vitamin B-12: 883 pg/mL (ref 232–1245)

## 2024-06-02 ENCOUNTER — Ambulatory Visit: Payer: Self-pay | Admitting: Physician Assistant

## 2024-06-02 DIAGNOSIS — L723 Sebaceous cyst: Secondary | ICD-10-CM | POA: Insufficient documentation

## 2024-06-02 NOTE — Progress Notes (Signed)
 Stacie Cardenas,   B12 looks great.  Vitamin D  in normal range but not to optimal goal. How much are you taking?  Thyroid looks good.  Cholesterol looks good.  Kidney and liver look good.  A1C up a little. Watch your sugars and carbs in diet and try to be more active.

## 2024-06-03 LAB — CYTOLOGY - PAP
Comment: NEGATIVE
Diagnosis: NEGATIVE
High risk HPV: NEGATIVE

## 2024-06-09 LAB — HM MAMMOGRAPHY

## 2024-06-09 NOTE — Progress Notes (Signed)
 Stacie Cardenas,   Pap shows normal cells and no HPV. Great news. Next pap in 5 years.

## 2024-06-20 ENCOUNTER — Encounter: Payer: Self-pay | Admitting: Physician Assistant
